# Patient Record
Sex: Female | Born: 1935 | Race: White | Hispanic: No | Smoking: Former smoker
Health system: Southern US, Community
[De-identification: ages and names within clinical notes are randomized; demographics above are authoritative.]

## PROBLEM LIST (undated history)

## (undated) DIAGNOSIS — M1990 Unspecified osteoarthritis, unspecified site: Secondary | ICD-10-CM

## (undated) DIAGNOSIS — K642 Third degree hemorrhoids: Secondary | ICD-10-CM

## (undated) DIAGNOSIS — N3281 Overactive bladder: Secondary | ICD-10-CM

## (undated) DIAGNOSIS — M5030 Other cervical disc degeneration, unspecified cervical region: Secondary | ICD-10-CM

## (undated) DIAGNOSIS — Z862 Personal history of diseases of the blood and blood-forming organs and certain disorders involving the immune mechanism: Secondary | ICD-10-CM

## (undated) DIAGNOSIS — Z973 Presence of spectacles and contact lenses: Secondary | ICD-10-CM

## (undated) DIAGNOSIS — J302 Other seasonal allergic rhinitis: Secondary | ICD-10-CM

## (undated) DIAGNOSIS — K219 Gastro-esophageal reflux disease without esophagitis: Secondary | ICD-10-CM

## (undated) DIAGNOSIS — Z8619 Personal history of other infectious and parasitic diseases: Secondary | ICD-10-CM

## (undated) DIAGNOSIS — R05 Cough: Secondary | ICD-10-CM

## (undated) DIAGNOSIS — F419 Anxiety disorder, unspecified: Secondary | ICD-10-CM

## (undated) DIAGNOSIS — Z8701 Personal history of pneumonia (recurrent): Secondary | ICD-10-CM

## (undated) DIAGNOSIS — N3946 Mixed incontinence: Secondary | ICD-10-CM

## (undated) DIAGNOSIS — R053 Chronic cough: Secondary | ICD-10-CM

## (undated) DIAGNOSIS — J4540 Moderate persistent asthma, uncomplicated: Secondary | ICD-10-CM

## (undated) DIAGNOSIS — Z974 Presence of external hearing-aid: Secondary | ICD-10-CM

## (undated) DIAGNOSIS — H269 Unspecified cataract: Secondary | ICD-10-CM

## (undated) DIAGNOSIS — F32A Depression, unspecified: Secondary | ICD-10-CM

## (undated) DIAGNOSIS — Z87820 Personal history of traumatic brain injury: Secondary | ICD-10-CM

## (undated) DIAGNOSIS — F329 Major depressive disorder, single episode, unspecified: Secondary | ICD-10-CM

## (undated) DIAGNOSIS — J479 Bronchiectasis, uncomplicated: Secondary | ICD-10-CM

## (undated) DIAGNOSIS — M8580 Other specified disorders of bone density and structure, unspecified site: Secondary | ICD-10-CM

## (undated) DIAGNOSIS — K589 Irritable bowel syndrome without diarrhea: Secondary | ICD-10-CM

## (undated) DIAGNOSIS — N281 Cyst of kidney, acquired: Secondary | ICD-10-CM

## (undated) HISTORY — DX: Gastro-esophageal reflux disease without esophagitis: K21.9

## (undated) HISTORY — DX: Other specified disorders of bone density and structure, unspecified site: M85.80

## (undated) HISTORY — PX: BREAST BIOPSY: SHX20

## (undated) HISTORY — DX: Irritable bowel syndrome, unspecified: K58.9

## (undated) HISTORY — DX: Major depressive disorder, single episode, unspecified: F32.9

## (undated) HISTORY — PX: TRANSTHORACIC ECHOCARDIOGRAM: SHX275

## (undated) HISTORY — DX: Depression, unspecified: F32.A

---

## 1990-12-23 HISTORY — PX: TUBAL LIGATION: SHX77

## 1998-08-04 ENCOUNTER — Ambulatory Visit (HOSPITAL_COMMUNITY): Admission: RE | Admit: 1998-08-04 | Discharge: 1998-08-04 | Payer: Self-pay | Admitting: Internal Medicine

## 1999-12-25 ENCOUNTER — Other Ambulatory Visit: Admission: RE | Admit: 1999-12-25 | Discharge: 1999-12-25 | Payer: Self-pay | Admitting: Family Medicine

## 2001-01-22 ENCOUNTER — Ambulatory Visit (HOSPITAL_COMMUNITY): Admission: RE | Admit: 2001-01-22 | Discharge: 2001-01-22 | Payer: Self-pay | Admitting: Gastroenterology

## 2001-01-22 ENCOUNTER — Encounter (INDEPENDENT_AMBULATORY_CARE_PROVIDER_SITE_OTHER): Payer: Self-pay | Admitting: Specialist

## 2001-10-15 ENCOUNTER — Other Ambulatory Visit: Admission: RE | Admit: 2001-10-15 | Discharge: 2001-10-15 | Payer: Self-pay | Admitting: Family Medicine

## 2003-10-11 ENCOUNTER — Encounter: Payer: Self-pay | Admitting: Cardiology

## 2003-10-11 ENCOUNTER — Inpatient Hospital Stay (HOSPITAL_COMMUNITY): Admission: EM | Admit: 2003-10-11 | Discharge: 2003-10-12 | Payer: Self-pay | Admitting: Emergency Medicine

## 2003-10-12 ENCOUNTER — Encounter: Payer: Self-pay | Admitting: Cardiology

## 2003-10-12 HISTORY — PX: CARDIOVASCULAR STRESS TEST: SHX262

## 2004-12-28 ENCOUNTER — Encounter: Admission: RE | Admit: 2004-12-28 | Discharge: 2004-12-28 | Payer: Self-pay | Admitting: Gastroenterology

## 2005-02-19 ENCOUNTER — Ambulatory Visit (HOSPITAL_COMMUNITY): Admission: RE | Admit: 2005-02-19 | Discharge: 2005-02-19 | Payer: Self-pay | Admitting: Gastroenterology

## 2005-02-19 ENCOUNTER — Encounter (INDEPENDENT_AMBULATORY_CARE_PROVIDER_SITE_OTHER): Payer: Self-pay | Admitting: *Deleted

## 2005-04-11 ENCOUNTER — Other Ambulatory Visit: Admission: RE | Admit: 2005-04-11 | Discharge: 2005-04-11 | Payer: Self-pay | Admitting: Family Medicine

## 2005-05-10 ENCOUNTER — Encounter (INDEPENDENT_AMBULATORY_CARE_PROVIDER_SITE_OTHER): Payer: Self-pay | Admitting: Specialist

## 2005-05-10 ENCOUNTER — Ambulatory Visit (HOSPITAL_COMMUNITY): Admission: RE | Admit: 2005-05-10 | Discharge: 2005-05-10 | Payer: Self-pay | Admitting: General Surgery

## 2005-05-10 HISTORY — PX: EVALUATION UNDER ANESTHESIA WITH HEMORRHOIDECTOMY: SHX5624

## 2005-07-29 ENCOUNTER — Encounter: Admission: RE | Admit: 2005-07-29 | Discharge: 2005-07-29 | Payer: Self-pay | Admitting: Gastroenterology

## 2006-02-06 ENCOUNTER — Encounter: Admission: RE | Admit: 2006-02-06 | Discharge: 2006-02-06 | Payer: Self-pay | Admitting: Gastroenterology

## 2006-04-14 ENCOUNTER — Encounter: Admission: RE | Admit: 2006-04-14 | Discharge: 2006-04-14 | Payer: Self-pay | Admitting: Family Medicine

## 2007-05-19 ENCOUNTER — Encounter: Admission: RE | Admit: 2007-05-19 | Discharge: 2007-05-19 | Payer: Self-pay | Admitting: Family Medicine

## 2007-05-25 ENCOUNTER — Encounter: Admission: RE | Admit: 2007-05-25 | Discharge: 2007-05-25 | Payer: Self-pay | Admitting: Family Medicine

## 2007-07-21 ENCOUNTER — Ambulatory Visit: Payer: Self-pay | Admitting: Internal Medicine

## 2007-08-27 ENCOUNTER — Ambulatory Visit: Payer: Self-pay | Admitting: Internal Medicine

## 2007-10-30 ENCOUNTER — Inpatient Hospital Stay (HOSPITAL_COMMUNITY): Admission: EM | Admit: 2007-10-30 | Discharge: 2007-10-31 | Payer: Self-pay | Admitting: Emergency Medicine

## 2007-11-09 ENCOUNTER — Ambulatory Visit: Payer: Self-pay

## 2007-11-27 ENCOUNTER — Ambulatory Visit: Payer: Self-pay | Admitting: Internal Medicine

## 2008-03-09 ENCOUNTER — Other Ambulatory Visit: Admission: RE | Admit: 2008-03-09 | Discharge: 2008-03-09 | Payer: Self-pay | Admitting: Obstetrics & Gynecology

## 2008-04-23 ENCOUNTER — Encounter: Payer: Self-pay | Admitting: Internal Medicine

## 2008-04-23 DIAGNOSIS — D649 Anemia, unspecified: Secondary | ICD-10-CM | POA: Insufficient documentation

## 2008-04-23 DIAGNOSIS — J189 Pneumonia, unspecified organism: Secondary | ICD-10-CM | POA: Insufficient documentation

## 2008-04-23 DIAGNOSIS — I059 Rheumatic mitral valve disease, unspecified: Secondary | ICD-10-CM | POA: Insufficient documentation

## 2008-04-23 DIAGNOSIS — E785 Hyperlipidemia, unspecified: Secondary | ICD-10-CM

## 2008-04-23 DIAGNOSIS — J302 Other seasonal allergic rhinitis: Secondary | ICD-10-CM | POA: Insufficient documentation

## 2008-04-23 DIAGNOSIS — J329 Chronic sinusitis, unspecified: Secondary | ICD-10-CM

## 2008-05-25 ENCOUNTER — Encounter: Admission: RE | Admit: 2008-05-25 | Discharge: 2008-05-25 | Payer: Self-pay | Admitting: Family Medicine

## 2008-11-16 ENCOUNTER — Encounter: Admission: RE | Admit: 2008-11-16 | Discharge: 2008-11-16 | Payer: Self-pay | Admitting: Gastroenterology

## 2009-02-02 ENCOUNTER — Encounter: Admission: RE | Admit: 2009-02-02 | Discharge: 2009-02-02 | Payer: Self-pay | Admitting: Gastroenterology

## 2009-05-16 ENCOUNTER — Encounter: Admission: RE | Admit: 2009-05-16 | Discharge: 2009-07-31 | Payer: Self-pay | Admitting: Obstetrics & Gynecology

## 2009-06-02 ENCOUNTER — Encounter: Admission: RE | Admit: 2009-06-02 | Discharge: 2009-06-02 | Payer: Self-pay | Admitting: Obstetrics & Gynecology

## 2010-02-19 ENCOUNTER — Emergency Department (HOSPITAL_COMMUNITY): Admission: EM | Admit: 2010-02-19 | Discharge: 2010-02-19 | Payer: Self-pay | Admitting: Emergency Medicine

## 2010-09-06 ENCOUNTER — Encounter: Admission: RE | Admit: 2010-09-06 | Discharge: 2010-09-06 | Payer: Self-pay | Admitting: Family Medicine

## 2011-01-13 ENCOUNTER — Encounter: Payer: Self-pay | Admitting: Family Medicine

## 2011-03-13 LAB — BLOOD GAS, VENOUS
Acid-Base Excess: 0.8 mmol/L (ref 0.0–2.0)
Bicarbonate: 22.8 mEq/L (ref 20.0–24.0)
O2 Saturation: 98.1 %
Patient temperature: 98.6
TCO2: 21.1 mmol/L (ref 0–100)
pCO2, Ven: 27.9 mmHg — ABNORMAL LOW (ref 45.0–50.0)
pH, Ven: 7.524 — ABNORMAL HIGH (ref 7.250–7.300)
pO2, Ven: 118 mmHg — ABNORMAL HIGH (ref 30.0–45.0)

## 2011-03-13 LAB — DIFFERENTIAL
Basophils Absolute: 0 K/uL (ref 0.0–0.1)
Basophils Relative: 0 % (ref 0–1)
Eosinophils Absolute: 0 K/uL (ref 0.0–0.7)
Eosinophils Relative: 0 % (ref 0–5)
Lymphocytes Relative: 6 % — ABNORMAL LOW (ref 12–46)
Lymphs Abs: 0.3 K/uL — ABNORMAL LOW (ref 0.7–4.0)
Monocytes Absolute: 0.2 10*3/uL (ref 0.1–1.0)
Monocytes Relative: 4 % (ref 3–12)
Neutro Abs: 4.5 10*3/uL (ref 1.7–7.7)
Neutrophils Relative %: 90 % — ABNORMAL HIGH (ref 43–77)

## 2011-03-13 LAB — BASIC METABOLIC PANEL
BUN: 11 mg/dL (ref 6–23)
CO2: 25 mEq/L (ref 19–32)
Calcium: 8.4 mg/dL (ref 8.4–10.5)
GFR calc Af Amer: >60 mL/min (ref >60)
GFR calc non Af Amer: >60 mL/min (ref >60)
Glucose, Bld: 135 mg/dL — ABNORMAL HIGH (ref 70–99)
Potassium: 3.3 mEq/L — ABNORMAL LOW (ref 3.5–5.1)
Sodium: 137 mEq/L (ref 135–145)

## 2011-03-13 LAB — BASIC METABOLIC PANEL WITH GFR
Chloride: 104 meq/L (ref 96–112)
Creatinine, Ser: 0.87 mg/dL (ref 0.4–1.2)

## 2011-03-13 LAB — CBC
HCT: 32.5 % — ABNORMAL LOW (ref 36.0–46.0)
Hemoglobin: 10.9 g/dL — ABNORMAL LOW (ref 12.0–15.0)
MCHC: 33.6 g/dL (ref 30.0–36.0)
MCV: 91.5 fL (ref 78.0–100.0)
Platelets: 183 10*3/uL (ref 150–400)
RBC: 3.55 MIL/uL — ABNORMAL LOW (ref 3.87–5.11)
RDW: 15.1 % (ref 11.5–15.5)
WBC: 5 K/uL (ref 4.0–10.5)

## 2011-03-18 ENCOUNTER — Other Ambulatory Visit (HOSPITAL_COMMUNITY): Payer: Self-pay | Admitting: Gastroenterology

## 2011-03-18 DIAGNOSIS — R197 Diarrhea, unspecified: Secondary | ICD-10-CM

## 2011-04-05 ENCOUNTER — Ambulatory Visit (HOSPITAL_COMMUNITY)
Admission: RE | Admit: 2011-04-05 | Discharge: 2011-04-05 | Disposition: A | Discharge status: Home / Self Care | Payer: Medicare Other | Source: Ambulatory Visit | Attending: Gastroenterology | Admitting: Gastroenterology

## 2011-04-05 ENCOUNTER — Encounter (HOSPITAL_COMMUNITY): Payer: Self-pay

## 2011-04-05 DIAGNOSIS — R197 Diarrhea, unspecified: Secondary | ICD-10-CM | POA: Insufficient documentation

## 2011-04-05 DIAGNOSIS — R109 Unspecified abdominal pain: Secondary | ICD-10-CM | POA: Insufficient documentation

## 2011-04-05 MED ORDER — SINCALIDE 5 MCG IJ SOLR: 0.0200 ug/kg | Freq: Once | INTRAMUSCULAR | Status: DC

## 2011-04-05 MED ORDER — TECHNETIUM TC 99M MEBROFENIN IV KIT
5.5000 | PACK | Freq: Once | INTRAVENOUS | Status: AC | PRN
  Administered 2011-04-05: 5.5 via INTRAVENOUS

## 2011-04-15 ENCOUNTER — Other Ambulatory Visit: Payer: Self-pay | Admitting: Gastroenterology

## 2011-04-15 DIAGNOSIS — R110 Nausea: Secondary | ICD-10-CM

## 2011-04-16 ENCOUNTER — Inpatient Hospital Stay (HOSPITAL_COMMUNITY)
Admission: EM | Admit: 2011-04-16 | Discharge: 2011-04-28 | DRG: 371 | Disposition: A | Discharge status: Home Health | Payer: Medicare Other | Attending: Internal Medicine | Admitting: Internal Medicine

## 2011-04-16 DIAGNOSIS — E8809 Other disorders of plasma-protein metabolism, not elsewhere classified: Secondary | ICD-10-CM | POA: Diagnosis present

## 2011-04-16 DIAGNOSIS — R0609 Other forms of dyspnea: Secondary | ICD-10-CM | POA: Diagnosis present

## 2011-04-16 DIAGNOSIS — F329 Major depressive disorder, single episode, unspecified: Secondary | ICD-10-CM | POA: Diagnosis present

## 2011-04-16 DIAGNOSIS — R0989 Other specified symptoms and signs involving the circulatory and respiratory systems: Secondary | ICD-10-CM | POA: Diagnosis present

## 2011-04-16 DIAGNOSIS — R630 Anorexia: Secondary | ICD-10-CM | POA: Diagnosis present

## 2011-04-16 DIAGNOSIS — T450X5A Adverse effect of antiallergic and antiemetic drugs, initial encounter: Secondary | ICD-10-CM | POA: Diagnosis present

## 2011-04-16 DIAGNOSIS — L251 Unspecified contact dermatitis due to drugs in contact with skin: Secondary | ICD-10-CM | POA: Diagnosis present

## 2011-04-16 DIAGNOSIS — R1310 Dysphagia, unspecified: Secondary | ICD-10-CM | POA: Diagnosis present

## 2011-04-16 DIAGNOSIS — Z22322 Carrier or suspected carrier of Methicillin resistant Staphylococcus aureus: Secondary | ICD-10-CM

## 2011-04-16 DIAGNOSIS — Z880 Allergy status to penicillin: Secondary | ICD-10-CM

## 2011-04-16 DIAGNOSIS — A0472 Enterocolitis due to Clostridium difficile, not specified as recurrent: Principal | ICD-10-CM | POA: Diagnosis present

## 2011-04-16 DIAGNOSIS — F05 Delirium due to known physiological condition: Secondary | ICD-10-CM | POA: Diagnosis not present

## 2011-04-16 DIAGNOSIS — K219 Gastro-esophageal reflux disease without esophagitis: Secondary | ICD-10-CM | POA: Diagnosis present

## 2011-04-16 DIAGNOSIS — Z87891 Personal history of nicotine dependence: Secondary | ICD-10-CM

## 2011-04-16 DIAGNOSIS — D649 Anemia, unspecified: Secondary | ICD-10-CM | POA: Diagnosis not present

## 2011-04-16 DIAGNOSIS — Z9109 Other allergy status, other than to drugs and biological substances: Secondary | ICD-10-CM

## 2011-04-16 DIAGNOSIS — J690 Pneumonitis due to inhalation of food and vomit: Secondary | ICD-10-CM | POA: Diagnosis not present

## 2011-04-16 DIAGNOSIS — J449 Chronic obstructive pulmonary disease, unspecified: Secondary | ICD-10-CM | POA: Diagnosis present

## 2011-04-16 DIAGNOSIS — R030 Elevated blood-pressure reading, without diagnosis of hypertension: Secondary | ICD-10-CM | POA: Diagnosis not present

## 2011-04-16 DIAGNOSIS — Z79899 Other long term (current) drug therapy: Secondary | ICD-10-CM

## 2011-04-16 DIAGNOSIS — F3289 Other specified depressive episodes: Secondary | ICD-10-CM | POA: Diagnosis present

## 2011-04-16 DIAGNOSIS — E871 Hypo-osmolality and hyponatremia: Secondary | ICD-10-CM | POA: Diagnosis present

## 2011-04-16 DIAGNOSIS — R7301 Impaired fasting glucose: Secondary | ICD-10-CM | POA: Diagnosis not present

## 2011-04-16 DIAGNOSIS — J4489 Other specified chronic obstructive pulmonary disease: Secondary | ICD-10-CM | POA: Diagnosis present

## 2011-04-16 DIAGNOSIS — D259 Leiomyoma of uterus, unspecified: Secondary | ICD-10-CM | POA: Diagnosis present

## 2011-04-16 LAB — COMPREHENSIVE METABOLIC PANEL
ALT: 14 U/L (ref 0–35)
AST: 19 U/L (ref 0–37)
Albumin: 2.7 g/dL — ABNORMAL LOW (ref 3.5–5.2)
Alkaline Phosphatase: 52 U/L (ref 39–117)
BUN: 8 mg/dL (ref 6–23)
Chloride: 104 mEq/L (ref 96–112)
GFR calc Af Amer: >60 mL/min (ref >60)
Potassium: 3.7 mEq/L (ref 3.5–5.1)
Sodium: 134 mEq/L — ABNORMAL LOW (ref 135–145)
Total Bilirubin: 0.8 mg/dL (ref 0.3–1.2)

## 2011-04-17 ENCOUNTER — Other Ambulatory Visit: Payer: Medicare Other

## 2011-04-17 ENCOUNTER — Encounter (HOSPITAL_COMMUNITY): Payer: Self-pay

## 2011-04-17 ENCOUNTER — Inpatient Hospital Stay (HOSPITAL_COMMUNITY): Payer: Medicare Other

## 2011-04-17 LAB — CBC
MCV: 87.4 fL (ref 78.0–100.0)
Platelets: 257 10*3/uL (ref 150–400)
RDW: 13.6 % (ref 11.5–15.5)
WBC: 15.8 10*3/uL — ABNORMAL HIGH (ref 4.0–10.5)

## 2011-04-17 LAB — URINALYSIS, ROUTINE W REFLEX MICROSCOPIC
Bilirubin Urine: NEGATIVE
Glucose, UA: NEGATIVE mg/dL
Hgb urine dipstick: NEGATIVE
Ketones, ur: NEGATIVE mg/dL
Nitrite: NEGATIVE
Protein, ur: NEGATIVE mg/dL
Specific Gravity, Urine: 1.01 (ref 1.005–1.030)
Urobilinogen, UA: 0.2 mg/dL (ref 0.0–1.0)
pH: 6 (ref 5.0–8.0)

## 2011-04-17 LAB — BASIC METABOLIC PANEL
BUN: 6 mg/dL (ref 6–23)
Creatinine, Ser: 0.69 mg/dL (ref 0.4–1.2)
GFR calc non Af Amer: >60 mL/min (ref >60)
Potassium: 3.8 mEq/L (ref 3.5–5.1)

## 2011-04-17 LAB — MAGNESIUM: Magnesium: 2.2 mg/dL (ref 1.5–2.5)

## 2011-04-17 MED ORDER — IOHEXOL 300 MG/ML  SOLN
100.0000 mL | Freq: Once | INTRAMUSCULAR | Status: AC | PRN
  Administered 2011-04-17: 100 mL via INTRAVENOUS

## 2011-04-18 LAB — URINE CULTURE

## 2011-04-19 LAB — CBC
Hemoglobin: 11.1 g/dL — ABNORMAL LOW (ref 12.0–15.0)
RBC: 3.95 MIL/uL (ref 3.87–5.11)

## 2011-04-19 LAB — BASIC METABOLIC PANEL
CO2: 19 mEq/L (ref 19–32)
Chloride: 110 mEq/L (ref 96–112)
GFR calc Af Amer: >60 mL/min (ref >60)
Potassium: 3.9 mEq/L (ref 3.5–5.1)
Sodium: 134 mEq/L — ABNORMAL LOW (ref 135–145)

## 2011-04-19 LAB — HEMOGLOBIN A1C: Mean Plasma Glucose: 126 mg/dL — ABNORMAL HIGH (ref <117)

## 2011-04-20 LAB — CBC
HCT: 32.2 % — ABNORMAL LOW (ref 36.0–46.0)
Hemoglobin: 10.7 g/dL — ABNORMAL LOW (ref 12.0–15.0)
RDW: 14.2 % (ref 11.5–15.5)
WBC: 22.4 10*3/uL — ABNORMAL HIGH (ref 4.0–10.5)

## 2011-04-20 LAB — BASIC METABOLIC PANEL
CO2: 24 mEq/L (ref 19–32)
GFR calc non Af Amer: >60 mL/min (ref >60)
Glucose, Bld: 101 mg/dL — ABNORMAL HIGH (ref 70–99)
Potassium: 4.2 mEq/L (ref 3.5–5.1)
Sodium: 134 mEq/L — ABNORMAL LOW (ref 135–145)

## 2011-04-20 NOTE — H&P (Signed)
[name], [name]              ACCOUNT NO.:  [id number]  MEDICAL RECORD NO.:  [id number]           PATIENT TYPE:  E  LOCATION:  WLED                         FACILITY:  [facility]  PHYSICIAN:  Vania Rea, M.D. DATE OF BIRTH:  03/12/1935  DATE OF ADMISSION:  04/16/2011 DATE OF DISCHARGE:                             HISTORY & PHYSICAL   PRIMARY CARE PHYSICIAN:  Dr. Ace Gins at [facility].  CHIEF COMPLAINT:  Persistent nausea, retching, and diarrhea.  HISTORY OF PRESENT ILLNESS:  This is a 76 year old Caucasian lady with a history of GERD with chronic cough, multiple allergies and a mild COPD, who suffers with chronic gastrointestinal disorder.  She has severe GERD for which she takes Nexium.  It apparently causes a chronic cough, aggravated by her mild COPD and she has episodic diarrhea.  The patient has however been pretty close to baseline until about 10 days ago when she had a HIDA scan to evaluate her persistent undiagnosed gastrointestinal systems.  The patient reports that since having a HIDA scan she has been having worsening diarrhea, nausea, retching and worsening cough.  She was seen at her doctor's office 3 days after the HIDA scan and prescribed Phenergan tablets as well as being given intravenous hydration and intramuscular Phenergan while in the office; however, the patient has failed to improve from then until now.  She apparently saw Dr. Ewing Schlein recently who started her on Bentyl and after taking the Bentyl for less than 24 hours, the patient revisited her primary care physician today with pruritic rash, which seemed to be like urticaria.  She received intravenous Solu-Medrol and Benadryl and was sent to the emergency room for further evaluation.  The patient had a CBC drawn in her doctor's office and she had a complete metabolic panel done in the emergency room.  No laboratory abnormalities were found but because the patient was so  ill-looking and because her symptoms have been persisting despite reasonable treatment, hospitalist service has been called to assist with management.  The patient denies any fever.  She is in a heated discussion with her husband whether she is actually more retching or coughing and her diarrhea seems to be mostly yellow mucus and no blood, but has caused her mouth to be persistently dry over the past many days.  PAST MEDICAL HISTORY: 1. GERD. 2. COPD/asthma. 3. Nasal allergies. 4. Depression. 5. Chronic GI upset.  MEDICATIONS:  #  Simethicone 80 mg four times daily as needed. 1. Recently started topical Biotene for dry mouth. 2. Recently started Phenergan 25 mg every 6 hours as needed. 3. Recently started Bentyl 20 mg four times daily. 4. Flonase nasal spray daily to both nostrils. 5. Zantac 150 mg daily as needed. 6. Singulair 10 mg daily. 7. Spiriva 18 mcg daily. 8. Advair Diskus 500/50 one puff twice daily. 9. Evista 60 mg daily. 10.Vitamin D 1000 units twice daily. 11.Nexium 40 mg daily. 12.Prozac 40 mg daily. 13.Calcium with zinc 1 tablet twice daily.  ALLERGIES: 1. PENICILLIN. 2. PROPOXYPHENE.  SOCIAL HISTORY:  She discontinued tobacco use in 1990 after having smoked since a teenager.  Denies  alcohol or illicit drug use.  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  FAMILY HISTORY:  Significant for a mother who was very long-lived and was pretty healthy up until she died at age 28 and a father who died in accidental death, did not live very long.  Brothers and sisters with coronary artery disease and GERD.  She also had a sister with severe gastrointestinal problems which was never relieved until she had her gallbladder removed which has led to the family wondering very seriously if [name] also has a gallbladder problem.  REVIEW OF SYSTEMS:  Other than noted above, complete review of systems are unremarkable.  She is somewhat  hard of hearing, but is able to give history without her hearing aid.  PHYSICAL EXAMINATION:  GENERAL:  A very pleasant elderly Caucasian lady reclining in the stretcher, moderately ill-looking. VITAL SIGNS:  Her temperature is 99.7, pulse 67, respirations 16, blood pressure 137/44.  She is saturating at 99% on room air.  Her weight in her doctor's office today was 125 pounds. HEENT:  Her pupils are round and equal.  Mucous membranes are pink, dry, anicteric.  No cervical lymphadenopathy.  No thyromegaly.  No carotid bruit.  Her mouth shows no sign of Candida or any other rash.  Throat is not hyperemic. CHEST:  Clear to auscultation bilaterally. CARDIOVASCULAR SYSTEM:  Regular rhythm.  No murmur heard. ABDOMEN:  Mildly distended and she reports diffuse tenderness.  Bowel sounds are normal.  No masses are felt. EXTREMITIES:  Without edema.  She has no arthritic deformities of knees or ankles. SKIN:  She has rose-colored easily blanching macules on her abdomen suggestive of acute allergy. CENTRAL NERVOUS SYSTEM:  Cranial nerves II-XII are grossly intact.  She has no focal lateralizing signs.  LABORATORY DATA:  CBC done at her primary physician's office shows a white count of 9.3, hemoglobin 11.5, platelets 266.  Differential is unremarkable.  There is no comment on eosinophils.  Her complete metabolic panel shows a sodium slightly low at 134, potassium 3.7, chloride of 104, CO2 of 21, glucose 99, BUN 8, creatinine 0.7, calcium is 7.9.  Her total protein is 5.4, albumin 2.7.  Her liver functions are otherwise unremarkable.  HIDA scan was reviewed from April 05, 2011, and that showed normal function with a gallbladder ejection fraction of 61%, normally 30%.  ASSESSMENT: 1. Gastroenteritis of unclear etiology. 2. Given her antibiotic exposure, need to rule out Clostridium     difficile. 3. Possible irritable bowel syndrome. 4. Possible allergic reaction given the description of a  pure mucousy     diarrhea. 5. Acute on chronic gastroesophageal reflux disease. 6. Chronic obstructive pulmonary disease. 7. Acute allergy possibly to BENTYL. 8. Dehydration as evidenced by the hyponatremia. 9. Chronic undernutrition as evidenced by hypoproteinemia and     hypoalbuminemia.  PLAN: 1. We will admit this lady for hydration.  We will keep her on clear     liquid diet and we will consult her gastroenterologist Dr. Ewing Schlein     for assistance in the morning.  She was scheduled for a CT scan of     the abdomen tomorrow and we will go ahead and get that tonight.  We     will check stool for C. diff PCR and we will start her empirically     on vancomycin and discontinue this if the PCR is negative.  We will     avoid Flagyl because of her extreme  nausea. 2. We will continue oral Benadryl and Pepcid for acute allergies. 3. We will double her dose of Nexium.  We will continue conservative     management of her COPD. 4. Other plans as per orders.     Vania Rea, M.D.     LC/MEDQ  D:  04/16/2011  T:  04/17/2011  Job:  [id number]  cc:   Petra Kuba, M.D. Fax: [phone]  Ace Gins, MD  Dr. W[facility]  Electronically Signed by Vania Rea M.D. on 04/20/2011 02:51:09 AM

## 2011-04-21 ENCOUNTER — Inpatient Hospital Stay (HOSPITAL_COMMUNITY): Payer: Medicare Other

## 2011-04-21 LAB — BASIC METABOLIC PANEL
CO2: 23 mEq/L (ref 19–32)
Calcium: 7.3 mg/dL — ABNORMAL LOW (ref 8.4–10.5)
Chloride: 106 mEq/L (ref 96–112)
Glucose, Bld: 105 mg/dL — ABNORMAL HIGH (ref 70–99)
Sodium: 134 mEq/L — ABNORMAL LOW (ref 135–145)

## 2011-04-21 LAB — CBC
HCT: 32.4 % — ABNORMAL LOW (ref 36.0–46.0)
Hemoglobin: 10.8 g/dL — ABNORMAL LOW (ref 12.0–15.0)
MCHC: 33.3 g/dL (ref 30.0–36.0)
RBC: 3.69 MIL/uL — ABNORMAL LOW (ref 3.87–5.11)

## 2011-04-21 LAB — MRSA PCR SCREENING: MRSA by PCR: POSITIVE — AB

## 2011-04-21 LAB — AMMONIA: Ammonia: 35 umol/L (ref 11–35)

## 2011-04-21 LAB — BLOOD GAS, ARTERIAL
Bicarbonate: 22.6 mEq/L (ref 20.0–24.0)
Patient temperature: 98.6
TCO2: 21 mmol/L (ref 0–100)
pCO2 arterial: 37.8 mmHg (ref 35.0–45.0)
pH, Arterial: 7.395 (ref 7.350–7.400)
pO2, Arterial: 85.2 mmHg (ref 80.0–100.0)

## 2011-04-22 LAB — BRAIN NATRIURETIC PEPTIDE: Pro B Natriuretic peptide (BNP): <30 pg/mL (ref 0.0–100.0)

## 2011-04-22 LAB — BASIC METABOLIC PANEL
Chloride: 104 mEq/L (ref 96–112)
GFR calc Af Amer: >60 mL/min (ref >60)
GFR calc non Af Amer: >60 mL/min (ref >60)
Potassium: 4 mEq/L (ref 3.5–5.1)
Sodium: 136 mEq/L (ref 135–145)

## 2011-04-22 LAB — CBC
Platelets: 359 10*3/uL (ref 150–400)
RBC: 3.42 MIL/uL — ABNORMAL LOW (ref 3.87–5.11)
RDW: 14.6 % (ref 11.5–15.5)
WBC: 12.1 10*3/uL — ABNORMAL HIGH (ref 4.0–10.5)

## 2011-04-23 ENCOUNTER — Inpatient Hospital Stay (HOSPITAL_COMMUNITY): Payer: Medicare Other

## 2011-04-23 LAB — CBC
HCT: 31.2 % — ABNORMAL LOW (ref 36.0–46.0)
Hemoglobin: 10.2 g/dL — ABNORMAL LOW (ref 12.0–15.0)
MCHC: 32.7 g/dL (ref 30.0–36.0)
RDW: 14.5 % (ref 11.5–15.5)
WBC: 12.3 10*3/uL — ABNORMAL HIGH (ref 4.0–10.5)

## 2011-04-23 LAB — BASIC METABOLIC PANEL
CO2: 25 mEq/L (ref 19–32)
Calcium: 7.3 mg/dL — ABNORMAL LOW (ref 8.4–10.5)
GFR calc Af Amer: >60 mL/min (ref >60)
GFR calc non Af Amer: >60 mL/min (ref >60)
Glucose, Bld: 98 mg/dL (ref 70–99)
Potassium: 3.8 mEq/L (ref 3.5–5.1)
Sodium: 133 mEq/L — ABNORMAL LOW (ref 135–145)

## 2011-04-23 NOTE — Group Therapy Note (Signed)
[name], [name]              ACCOUNT NO.:  [id number]  MEDICAL RECORD NO.:  [id number]           PATIENT TYPE:  I  LOCATION:  1238                         FACILITY:  [facility]  PHYSICIAN:  Hillery Aldo, M.D.   DATE OF BIRTH:  03-04-1935                                PROGRESS NOTE   DATE OF DISCHARGE: Pending.  PRIMARY CARE PHYSICIAN: Ace Gins, MD, at [facility] practice.  CURRENT DIAGNOSES: 1. Severe Clostridium difficile colitis. 2. Persistent nausea and vomiting. 3. Hospital-acquired pneumonia. 4. Delirium. 5. Impaired fasting glucose/hyperglycemia. 6. Anorexia. 7. Transient hypertension. 8. Rash secondary to adverse drug reaction 9. Gastroesophageal reflux disease. 10.History of chronic obstructive pulmonary disease/asthma. 11.Depression. 12.Normocytic anemia. 13.Uterine fibroids.  DISCHARGE MEDICATIONS: Will be dictated at time of actual discharge.  CONSULTATIONS: None.  BRIEF ADMISSION HISTORY OF PRESENT ILLNESS: The patient is a very pleasant 76 year old female who presented to the hospital with a chief complaint of diarrhea, nausea, vomiting, and worsening cough since having undergone a HIDA scan to evaluate her persistent abdominal symptoms.  The patient was given Phenergan by her primary care physician and ultimately presented to the hospital with ongoing symptoms.  For the full details, please see the dictated report done by Dr. Orvan Falconer.  PROCEDURES AND DIAGNOSTIC STUDIES.: 1. CT scan of the abdomen and pelvis on April 17, 2011, showed diffuse     colitis involving the entirety of the colon with colonic wall     thickening, extensive soft tissue stranding.  Most prominence along     the sigmoid colon and rectum may reflect an infectious or     inflammatory process.  No definite evidence for ischemic colitis.     Mild partial consolidation of the right lower lobe with trace right     pleural effusion.  Multiple hepatic  hemangiomas.  Scattered small     bilateral renal cysts.  Multiple calcified fibroids within the     uterus.  Prominent periuterine vasculature. 2. Chest x-ray on April 21, 2011, showed poor inspiration with basilar     atelectasis and possible pneumonia at the left lung base.     Questionable mild pulmonary vascular congestion. 3. Chest x-ray on Apr 23, 2011, showed continued low lung volumes with     confluent bibasilar opacity, favor representing a combination of     atelectasis and infection.  No interval change.  DISCHARGE LABORATORY VALUES: Will be dictated at time of actual discharge.  HOSPITAL COURSE BY PROBLEM: 1. Severe Clostridium difficile colitis:  The patient was admitted and     Clostridium difficile PCR studies were sent and turned positive     within 24 hours.  She was started on oral vancomycin, IV Flagyl as     well as Florastor but despite aggressive therapy, has continued to     have large numbers of diarrhea stools.  Cholestyramine was added on     April 22, 2011.  At this point, the patient is reporting some mild     improvement.  She will need a prolonged course of therapy given     that we have had to start her  on broad-spectrum antibiotics to     treat a hospital-acquired pneumonia. 2. Nausea and vomiting:  The patient has been provided with     antiemetics p.r.n..  Vomiting has subsided but she continues to     complain of nausea and diminished appetite. 3. Hospital-acquired pneumonia:  The patient did have infiltrates     noted on her original CT scanning and it was hoped that we could     avoid putting her on antibiotics given her severe Clostridium     difficile.  The patient continued to have respiratory problems with     increasing cough and ultimately was felt to have a hospital-     acquired pneumonia.  She was put on a combination of Avelox and     vancomycin due to her nasal swab being positive for MRSA.  It was     felt that the Flagyl would cover  anaerobes.  At this point, the     patient is on day #3 of therapy and has clinically improved.  She     was moved to the step-down unit for closer monitoring and it is     anticipated that she will stabilize enough in the next 24 hours to     be transferred back to the floor. 4. Delirium:  The patient did develop hallucinations and restlessness     consistent with acute delirium just prior to the initiation of     antibiotics for hospital-acquired pneumonia.  At this point, the     patient's delirium is slowly improving.  She did require p.r.n.     Haldol and sedating medications have been withdrawn. 5. Impaired fasting glucose/hyperglycemia:  The patient was noted to     have elevated blood glucoses on fasting samples.  A hemoglobin A1c     was checked and found to be 6% which is slightly above the normal     range.  She likely does have some underlying impaired fasting     glucose but at this point is not consuming enough calories to put     her on a diet.  When she is well, would consider diet counseling. 6. Anorexia:  Secondary to #1 and #2.  Should improve as her overall     clinical condition improves. 7. Hypertension:  The patient did develop transient hypertension.  It     was felt to be due to IV fluid administration.  Her IV fluids have     been cut back and her blood pressure has come down to normal.  The patient's other chronic medical problems have remained stable while in the hospital.     Hillery Aldo, M.D.     CR/MEDQ  D:  04/23/2011  T:  04/23/2011  Job:  [id number]  cc:   Ace Gins, MD  Electronically Signed by Hillery Aldo M.D. on 04/23/2011 06:30:12 PM

## 2011-04-24 LAB — BASIC METABOLIC PANEL
CO2: 26 mEq/L (ref 19–32)
Chloride: 96 mEq/L (ref 96–112)
Glucose, Bld: 95 mg/dL (ref 70–99)
Potassium: 3.9 mEq/L (ref 3.5–5.1)
Sodium: 133 mEq/L — ABNORMAL LOW (ref 135–145)

## 2011-04-24 LAB — CBC
HCT: 33.3 % — ABNORMAL LOW (ref 36.0–46.0)
Hemoglobin: 10.8 g/dL — ABNORMAL LOW (ref 12.0–15.0)
MCHC: 32.4 g/dL (ref 30.0–36.0)
RBC: 3.8 MIL/uL — ABNORMAL LOW (ref 3.87–5.11)

## 2011-04-25 LAB — CBC
MCH: 28.3 pg (ref 26.0–34.0)
Platelets: 458 10*3/uL — ABNORMAL HIGH (ref 150–400)
RBC: 3.64 MIL/uL — ABNORMAL LOW (ref 3.87–5.11)
RDW: 14.4 % (ref 11.5–15.5)
WBC: 11.5 10*3/uL — ABNORMAL HIGH (ref 4.0–10.5)

## 2011-04-25 LAB — GLUCOSE, CAPILLARY: Glucose-Capillary: 105 mg/dL — ABNORMAL HIGH (ref 70–99)

## 2011-04-25 LAB — EXPECTORATED SPUTUM ASSESSMENT W GRAM STAIN, RFLX TO RESP C

## 2011-04-27 LAB — BASIC METABOLIC PANEL WITH GFR
BUN: <3 mg/dL — ABNORMAL LOW (ref 6–23)
CO2: 32 meq/L (ref 19–32)
Calcium: 8.1 mg/dL — ABNORMAL LOW (ref 8.4–10.5)
Chloride: 100 meq/L (ref 96–112)
Creatinine, Ser: <0.47 mg/dL (ref 0.4–1.2)
Glucose, Bld: 104 mg/dL — ABNORMAL HIGH (ref 70–99)
Potassium: 3.5 meq/L (ref 3.5–5.1)
Sodium: 138 meq/L (ref 135–145)

## 2011-04-29 NOTE — Discharge Summary (Signed)
  [name], [name]              ACCOUNT NO.:  [id number]  MEDICAL RECORD NO.:  [id number]           PATIENT TYPE:  I  LOCATION:  1409                         FACILITY:  [facility]  PHYSICIAN:  Brendia Sacks, MD    DATE OF BIRTH:  May 23, 1935  DATE OF ADMISSION:  04/16/2011 DATE OF DISCHARGE:  04/28/2011                              DISCHARGE SUMMARY   ADDENDUM:  This is an addendum discharge summary to the one dictated by Algis Downs on Apr 26, 2011.  PRIMARY CARE PHYSICIAN:  Ernestina Penna, MD  PRIMARY GASTROENTEROLOGIST:  Anselmo Rod, MD, [facility]  The patient's hospitalization course has remained unchanged.  Discharge diagnoses and her summary remain unchanged.  This is an addendum discharge summary to clarify discharge medications.  This list of discharge medications supercedes the one in the previous discharge summary. 1. Bedside commode for generalized weakness, pneumonia, and     Clostridium difficile colitis. 2. Ensure p.o. t.i.d. 3. Nystatin 5 mL oral suspension q.i.d. 4. Oxygen at 2 L per minute nasal cannula for pneumonia and hypoxia. 5. Home Health physical therapy 6. Vancomycin 125 mg p.o. q.i.d. for 10 days, last dose May 08, 2011. 7. Advair Diskus 500/50 one puff b.i.d. 8. Biotin one application daily as needed for dry mouth. 9. Calcium with zinc p.o. b.i.d. 10.Evista 60 mg p.o. daily. 11.Fenofibrate 160 mg p.o. daily. 12.Flexeril 10 mg p.o. q.h.s. as needed at bedtime. 13.Flonase 1 spray in both nostrils daily as needed. 14.HyoMax 1/2 to 1 tablet  p.o. b.i.d. 15.Nexium 40 mg p.o. daily. 16.Promethazine 25 mg p.o. every 6 hours as needed for nausea. 17.Prozac 20 mg p.o. 2 tablets daily. 18.Simethicone 80 mg p.o. daily as needed for gas. 19.Singular 10 mg p.o. daily. 20.Spiriva 1 inhalation daily. 21.Tramadol 50 mg every 6 hours as needed for pain. 22.Ventolin 1 to 2 puffs every 4 hours as needed for shortness of     breath. 23.Vitamin D 1000 units  p.o. b.i.d. 24.Zantac 150 mg at bedtime if Nexium not helping  Discontinue the following medications: 1. Azithromycin. 2. Bentyl.  Time coordinating discharge is 35 minutes.     Brendia Sacks, MD     DG/MEDQ  D:  04/28/2011  T:  04/28/2011  Job:  [id number]  cc:   Ernestina Penna, M.D. Fax: [phone]  WGNFAO ZHY QMVH, MD, [facility] Fax: [phone]  Electronically Signed by Brendia Sacks  on 04/29/2011 06:04:43 PM

## 2011-04-30 LAB — VIRUS CULTURE

## 2011-05-07 NOTE — Discharge Summary (Signed)
[name], [name]              ACCOUNT NO.:  [id number]   MEDICAL RECORD NO.:  [id number]          PATIENT TYPE:  INP   LOCATION:  4735                         FACILITY:  [facility]   PHYSICIAN:  Lucita Ferrara, MD         DATE OF BIRTH:  [birth year]   DATE OF ADMISSION:  10/30/2007  DATE OF DISCHARGE:  10/31/2007                               DISCHARGE SUMMARY   ADMISSION DIAGNOSIS:  1. Chest pain, rule out myocardial ischemia.   OTHER DIAGNOSES UPON ADMISSION:  Are as follows:  1. Osteoporosis.  2. Hypomagnesemia.  3. Gastroesophageal reflux disease.  4. Asthmatic bronchitis.  5. Irritable bowel syndrome.  6. Mitral valve prolapse.   DISCHARGE DIAGNOSES:  1. Chest pain, resolved.  2. Osteoporosis.  3. Hypomagnesemia.  4. Gastroesophageal reflux disease.  5. Asthmatic bronchitis.  6. Irritable bowel syndrome.  7. Mitral valve prolapse.   DISCHARGE CONDITION:  Patient is stable, no longer has chest pain,  hemodynamically stable.   CONSULTATIONS:  None.   PROCEDURES:  1. Patient had a chest x-ray which was negative.  2. Patient had serial cardiac enzymes which were also negative x3.  3. Fasting lipid panel was also done which showed total cholesterol of      185, triglycerides 41, cholesterol HDL 66, LDL 111.  4. A complete metabolic panel which was within normal limits.   OTHER PROCEDURES WHICH WERE DONE:  EKG which showed some EKG changes in  the anterior leads, flipped T waves.  This was compared to the prior EKG  which was back in September of 2008.   BRIEF HISTORY:  Patient is a 76 year old who presented to [facility] with chest pain that started in the a.m. on October 30, 2007, radiating to shoulder and back, worse with coughing.  Patient had  coughing exacerbation secondary to asthmatic bronchitis.  She was also  diaphoretic at that time.  She presented to primary care's office, saw a  physician assistant and had the EKG done and was sent here to  the  emergency room.  Patient was given en route four aspirin 81 mg and two  nitros and pain then resolved.  Patient presented here.  Pain was  nonreproducible and no changes in position, no gastroesophageal reflux  disease.   HOSPITAL COURSE:  Patient stayed overnight.  Pain completely resolved.  The coughing also resolved.  Patient was empirically started on  Zithromax 500 mg and patient was given Lovenox 40 mg subcutaneously  q.day for DVT prophylaxis, oxygen, nitro p.r.n., and patient was also  given a small dose of metoprolol at 12.5 mg p.o. b.i.d.  Routine chest  pain protocol.  Patient's, again, pain completely resolved.  Her  diaphoresis and cough also resolved.   DISCHARGE PHYSICAL EXAMINATION:  GENERAL:  Generally speaking, patient  is currently in no acute distress.  HEENT:  Normocephalic, atraumatic.  Sclerae anicteric.  NECK:  Supple.  No JVD.  No carotid bruits.  CARDIOVASCULAR:  S1, S2.  Regular rate and rhythm.  No rubs or clicks.  There is a 2/6 diastolic murmur noted.  VITAL SIGNS:  Temperature 98.2, pulse 80, blood pressure 120/82,  respirations 18, pulse ox 99% on room air.  ABDOMEN:  Soft, nontender, nondistended.  Positive bowel sounds.  EXTREMITIES:  No clubbing, cyanosis or edema.   ACTIVITY:  Patient is able to walk, ambulate without chest pain or  shortness of breath.   DIET:  Normal.   MEDICATIONS:  Patient is going to be discharged on the following  medications:  1. Evista; she is unsure of the dosage and she takes it daily and will      continue that.  2. Gaviscon ES as needed.  3. Magnesium 250 mg; magnesium oxide, I believe; she is unsure of that      dose, but I would continue that.  4. Nexium 40 mg p.o. daily.  5. Prozac 20 mg p.o. daily.  6. Singulair, unsure of the dose.  7. Vitamin C chewable daily.  8. Zocor.  9. Zyrtec 10 mg p.o. daily.  10.Patient is to continue Pulmicort, Mucinex and acidophilus, and      albuterol and Atrovent.   11.Patient also is to continue Zithromax.   FOLLOWUP INSTRUCTIONS:  1. Patient is going to be scheduled for outpatient stress test with      [facility].  I called the PA, Ward Givens, who is going to      schedule this for her and I have already faxed the information to      the office.  2. Patient to resume all normal activities; call us if she has any      chest pain, shortness of breath, to present back to the emergency      room via EMS or acutely.      Lucita Ferrara, MD  Electronically Signed     RR/MEDQ  D:  10/31/2007  T:  10/31/2007  Job:  [id number]

## 2011-05-07 NOTE — Assessment & Plan Note (Signed)
[facility]                             PULMONARY OFFICE NOTE   NAME:[name], [name]                     MRN:          [id number]  DATE:07/21/2007                            DOB:          [birth year]    PROBLEM:  A 76 year old woman referred through the courtesy of Dr. Christell Constant  in pulmonary consultation concerned about asthma with coughing.   HISTORY:  She has had a long-standing history of a problem coughing  going back into the 1990s, when she was seeing Dr. Marga Melnick as  her primary physician.  He diagnosed reflux and treatment for reflux did  seem to help the cough some.  She has been somewhat worse in the last 4  years.  Singulair was added and that seemed to help but metered inhalers  and Spiriva have not helped.  There used to be a seasonal pattern but it  has become pretty constant year round now with irritant triggers  recognized including dust, mold, and perfumes.  She says peppermint  makes it better.  And, she describes daily cough producing scant clear  sputum.  Sometimes there is some wheeze.  She has become a little more  easily short of breath in recent years with exertion.  Chest x-ray  report from [facility] done April 08, 2006, described no acute  abnormality, probable COPD with hyperinflation but clear lungs.  Spirometry at that date was normal.   MEDICATIONS:  Singulair, Prozac, Nexium, Zantac, Evista, vitamins.   DRUG INTOLERANT:  1. PENICILLIN.  2. LATEX.  No problem with contrast dye or aspirin.  No problem with specific foods  or unusual reactions to insect stings.   REVIEW OF SYSTEMS:  Exertional dyspnea, productive cough, acid  indigestion, some difficulty swallowing, headaches, sneezing and  itching, earaches, occasional anxiety and depression.   PAST HISTORY:  1. Mitral valve prolapse.  2. Pneumonia as an infant.  Never hospitalized for lung disease.  She      had pneumococcal vaccine 2 years ago.  3. Previous sinusitis.  4. Anemia in the past without transfusion.  5. Elevated cholesterol.  6. Breast biopsy.  7. Hemorrhoidectomy.   SOCIAL HISTORY:  Quit smoking in 1990, rare alcohol.  She is married  with children.  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  She has  been caring for her mother who is now in critical care at [facility] with pneumonia.   ENVIRONMENT:  There are cats in the home.  They have a storage basement,  central air conditioning, carpeted floors with plans to replace carpet  with hard wood.  She does have encasing on the bedding.   OBJECTIVE:  VITAL SIGNS:  Weight 147 pounds, BP 114/62, pulse 62, room  air saturation 95%.  GENERAL:  Medium build, alert, no distress.  SKIN:  No rash.  ADENOPATHY:  None found.  HEENT:  Palate spacing 3-4/4.  There is a slight raspy quality during  vocalized expiration.  No stridor.  She is not really hoarse.  I do not  see evidence of drainage.  The pharynx is not red.  Thyroid does not  feel enlarged.  HEART:  I question a mid systolic click but hear no murmur.  LUNGS:  Fields are clear.  ABDOMEN:  Without hepatosplenomegaly.  EXTREMITIES:  Without cyanosis, clubbing, or edema.   IMPRESSION:  1. Strong suggestion that her cough is primarily related to reflux.      Her peppermints are the worst thing she could be doing because they      tend to relax the lower esophageal sphincter and this point will be      re-emphasized going forward.  2. Question of chronic obstructive pulmonary disease  raised by      hyperinflation on chest x-ray.   PLAN:  PFTs, schedule return in 1 month, earlier p.r.n.   I appreciate the chance to meet her.     Clinton D. Maple Hudson, MD, [facility]ollman, FACP  Electronically Signed    CDY/MedQ  DD: 07/26/2007  DT: 07/26/2007  Job #: [id number]   cc:   Ernestina Penna, M.D.

## 2011-05-07 NOTE — Consult Note (Signed)
[name], [name]              ACCOUNT NO.:  [id number]   MEDICAL RECORD NO.:  [id number]          PATIENT TYPE:  INP   LOCATION:  4735                         FACILITY:  [facility]   PHYSICIAN:  Lucita Ferrara, MD         DATE OF BIRTH:  October 20, 1935   DATE OF CONSULTATION:  DATE OF DISCHARGE:                                 CONSULTATION   The patient is a 76 year old.  The patient comes in with chest pain,  which started earlier in the a.m., located left anterior precordial  area, described as pressure like  and intensity 10/10, radiates to her  back and shoulders, positive cough/shortness of breath, but patient is  chronically short of breath secondary to asthma, asthmatic bronchitis.  she was also diaphoretic.  At that time, patient presented to the PMD  office.  Note, her PMD is Dr. Dimple Casey, who has left the practice, although  Dr. Christell Constant [place]  When she presents at the PMD's office, the PA did an EKG and  sent her to the emergency room by EMS.  The patient had 2 nitros at  PMD's and 4 aspirins.  Chest pain has happened before and always  associated with coughing spells.  The chest pain is not associated  with food or gastroesophageal reflux disease.  Chest pain  nonreproducible.  Also no change in the position.  Denies any orthopnea  or paroxysmal nocturnal dyspnea.   PAST MEDICAL HISTORY:  Significant for:  1. Asthma with treated with Pulmicort at home and also with Singulair.  2. Gastroesophageal reflux disease.  3. IBS.  4. Mitral valve prolapse.   REVIEW OF SYSTEMS:  Otherwise negative.  Denies abdominal pain,  diarrhea.  No URI symptoms.  No urinary symptoms.  She has wheezing and  has a cough.   ALLERGIES:  ALLERGIC TO PENICILLIN.  SHE GETS JOINT PAIN APPARENTLY.   PAST SURGICAL HISTORY:  She has had hemorrhoidectomy.   SOCIAL HISTORY:  She is a nonsmoker.  She quit smoking in 1990.  She had  been smoking since  age 48 at that time.   PHYSICAL EXAM:  GENERAL:  Generally speaking, patient is currently in no acute distress.  HEENT:  Normocephalic, atraumatic.  Sclerae anicteric.  NECK:  Supple.  No JVD.  No carotid bruits.  CARDIOVASCULAR:  S1, S2.  Regular rate and rhythm.  No rubs or clicks.  There is a 2/6 diastolic murmur noted.  VITAL SIGNS:  Temperature 98.2, pulse 80, blood pressure 120/82,  respirations 18, pulse ox 99% on room air.  ABDOMEN:  Soft, nontender, nondistended.  Positive bowel sounds.  EXTREMITIES:  No clubbing, cyanosis or edema.    EKG shows normal sinus rhythm.  Foot T waves in the anterior leads.  No  change from May 08, 2007.  A chest x-ray shows a left lower lobe  scarring.  CBC within normal limits.  Hemoglobin 11.1.  Hematocrit 33.2.  Complete metabolic panel within normal limits.  Troponin 0.05.  First  set of cardiac enzymes is negative.  All labs, including cardiac enzymes- Nornal.   ASSESSMENT AND PLAN:  This is a 76 year old with chest pain, very  atypical given the association with cough, but given new EKG changes, we  will keep her and rule out non-ST elevated myocardial ischemia or  infarction.  1. Chest pain.  Cardiac enzymes x3 every 6 hours.  Aspirin/beta blocker/oxygen 2 liters nasal cannula to keep the pulse ox  above 93%  A 2D echo in the morning.  A thallium stress test in the morning.  Spoke with [facility].  The patient is on the schedule.  Prophylaxis with Lovenox 40 mg subcu daily.  1. For asthma/bronchitis.  Continue Singulair 10 mg daily.  Albuterol/Atrovent every 4-6 hours.  Empirically treat with Zithromax daily.  1. For other medications the patient is taking at home, continue      Prozac 10 mg p.o. daily and Singulair 10 mg p.o. daily.      Prophylaxis with Lovenox for DVT and Protonix 40 mg for GI.  I have      explained all plans and procedures of this admission to this      patient and patient's husband in the room.   The patient      understands.  The patient is a full code.      Lucita Ferrara, MD  Electronically Signed     RR/MEDQ  D:  10/30/2007  T:  10/31/2007  Job:  [id number]

## 2011-05-07 NOTE — Assessment & Plan Note (Signed)
 HEALTHCARE                             PULMONARY OFFICE NOTE   NAME:[name], [name]                     MRN:          [id number]  DATE:11/27/2007                            DOB:          [birth year]    PROBLEMS:  1. Cough.  2. Esophageal reflux.  3. History of mitral valve prolapse.   HISTORY:  She was hospitalized for a day in early November by the  hospitalist and was discharged with diagnoses of chest pain, resolved,  osteoporosis, hypomagnesemia, esophageal reflux disease, asthmatic  bronchitis, irritable bowel syndrome, and mitral valve prolapse. Chest  pain had resolved. Summary indicates that a chest x-ray was negative.  Cardiac enzymes were negative. EKG had some flipped T-waves in anterior  waves, apparently not new. She also says that a nuclear stress test was  normal on November 17 for [facility]. She is less frequently  aware of reflux. She sometimes notice a little shortness of breath. She  is not sure how well her medicines really are helping her. She thought  that Pulmicort was helpful, but then it no longer seemed to support her.   MEDICATIONS:  1. Singulair 10 mg.  2. Prozac 20 mg.  3. Nexium 40 mg.  4. Evista.  5. Vitamins.  6. Zyrtec 10 mg p.r.n.  7. Occasional Mucinex.   She has dropped off of inhalers.   Drug intolerant PENICILLIN.   OBJECTIVE:  Weight 144 pounds, blood pressure 142/78, pulse 60, room air  saturation 97%. Lung fields are clear. Heart sounds are regular and  normal. There is dry cough with deep breath. Throat looks clear. Nasal  airway is clear.   IMPRESSION:  Mild intermittent asthma. I suspect reflux is an important  part of her problem. Note that pulmonary function testing here on  08/27/2007 had shown mild obstructive airways disease in the small airways  where there was some response to bronchodilator. Lung volumes suggested  hyperinflation with air trapping. Diffusion was mildly reduced.  Her  overall FEV1 was high normal at 127 with FEV1/FVC ratio of 0.79. On a  six minute walk test, she went 504 meters with normal cardiopulmonary  response holding oxygen saturation 95% to 97% and heart rate rising from  62 to 87 and dropping back to 66 two minutes later.   PLAN:  Reflux precautions. A copy of the recent discharge summary to  being forwarded to Dr. Vernon Prey for his record. Flu vaccine discussed  and given. Schedule return 4 months, earlier p.r.n.     Clinton D. Maple Hudson, MD, Tonny Bollman, FACP  Electronically Signed    CDY/MedQ  DD: 11/29/2007  DT: 11/30/2007  Job #: [id number]   cc:   Ernestina Penna, M.D.

## 2011-05-07 NOTE — Assessment & Plan Note (Signed)
 HEALTHCARE                             PULMONARY OFFICE NOTE   NAME:[name], [name]                     MRN:          [id number]  DATE:08/27/2007                            DOB:          [birth year]    PROBLEM LIST:  1. Cough.  2. Esophageal reflux.  3. History of mitral valve prolapse.   HISTORY:  Still coughing and finds that albuterol seems to make her  cough.  Few colds.  She thinks she has allergy but does not define  this well.  She used to note a seasonal rhinitis and some drainage which  are now persistent through the year.  Since we talked, she is  occasionally aware of reflux episodes.  Her chest x-ray by Dr. Christell Constant had  shown some hyperinflation suggestive of COPD in April of last year per  Dr. Christell Constant.   MEDICATIONS:  1. Singulair.  2. Prozac.  3. Nexium.  4. Zantac.  5. Evista.  6. Vitamins.  7. Omega-3.  8. Zyrtec.   ALLERGIES:  PENICILLIN.   OBJECTIVE:  VITAL SIGNS:  Weight 148 pounds, blood pressure 114/70,  pulse 62, room air saturation 95%.  GENERAL:  She had a spell of hard coughing which then settled down and  after that her chest was clear and quiet.  Pharynx is not reddened.  Voice quality was normal.  I saw no evidence of nasal congestion or  postnasal drip.  Pulmonary function testing showed mild obstructive  airways disease with response to bronchodilator.  Her FEV1 was normal.  Lung volumes were a little increased suggesting possible hyperinflation  and air trapping, although, they were in the range of scores associated  with her FEV1 and FVC, so she may just have large lungs.  Diffusion was  mildly reduced at 76%.  On a 6-minute walk test, she went 504 meters in  6 minutes with normal heart rate and blood pressure response.  She  maintained her oxygen saturation 95-97% through the exercise indicating  unremarkable cardiopulmonary reserve for that level of activity.   IMPRESSION:  1. Cough probably with  episodic tracheobronchitis and minimal asthma.  2. Probable episodic reflux.  3. Mild frenial allergic rhinitis.   PLAN:  1. Emphasis on reflux precautions and over-the-counter therapy.  2. Since albuterol caused cough, we are replacing that with a trial of      maintenance suppression with Pulmicort twisthaler two puffs b.i.d.      with instructions.  Schedule return in 3 months, earlier p.r.n.     Clinton D. Maple Hudson, MD, Tonny Bollman, FACP  Electronically Signed    CDY/MedQ  DD: 08/30/2007  DT: 08/30/2007  Job #: [id number]   cc:   Ernestina Penna, M.D.

## 2011-05-10 NOTE — H&P (Signed)
NAME:  [name], [name]                        ACCOUNT NO.:  [id number]   MEDICAL RECORD NO.:  [id number]                   PATIENT TYPE:  EMS   LOCATION:  MAJO                                 FACILITY:  [facility]   PHYSICIAN:  Arturo Morton. Riley Kill, M.D.             DATE OF BIRTH:  05/28/1935   DATE OF ADMISSION:  10/11/2003  DATE OF DISCHARGE:                                HISTORY & PHYSICAL   CHIEF COMPLAINT:  Chest discomfort.   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  Over the past several days, she has had some  shooting pain from the right shoulder across the chest that was about 5/10.  This was short lived, but there was some queasiness noted with this. She  also had some discomfort in the jaw, although it should be noted that she  has a history of TMJ in the past.  The discomfort then shot across the left  chest, and there was some dull pain.  Last night, she was awakened from  sleep.  She has been under quite a bit of stress taking care of her 90-year-  old mother.  EKG did not reveal any significant abnormalities.  She was  subsequently transferred here for further evaluation and therapy.   PAST MEDICAL HISTORY:  1. History of gastroesophageal reflux which she describes as slightly     different.  2. Reflux-induced asthma.  3. Dyslipidemia with elevated cholesterol but high HDL.  4. Mitral valve prolapse.   Specific negatives included no history of hypertension or diabetes.  The  patient was a former smoker but quit in 1990.   ALLERGIES:  PENICILLIN.   MEDICATIONS:  1. Prevacid 30 mg daily.  2. Singulair 10 mg a day.  3. Zyrtec b.i.d.  4. Prozac 20 mg daily.  5. Omega-3 1000 mg t.i.d.  6. Flax seed oil 1000 mg t.i.d.  7. Calcium 500 mg t.i.d.  8. Folic acid 800 mcg daily.  9. Vitamin C 500 mg daily.  10.      Vitamin B12  1000 mg daily.   FAMILY HISTORY:  Her mother is 2 and alive.  Her father died of an accident  at a young age.  She has a brother who has had bypass surgery and one  brother with hypercholesterolemia.  She has one sister who is alive and well  with elevated cholesterol.   SOCIAL HISTORY:  The patient lives in [place].  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  She  does not smoke currently.  She drinks only socially.   REVIEW OF SYSTEMS:  Remarkable for some hip arthralgias and gastroesophageal  reflux.  She has occasional dizziness at times.  She has occasional  palpitations.  Review of  Systems is otherwise negative.   PHYSICAL EXAMINATION:  GENERAL:  The patient is an alert, oriented female in  no acute distress.  She is alert and oriented x 3.  She is fit looking.  She  denies any ongoing chest pain at the present time.  VITAL SIGNS:  Temperature 98.1, pulse 69, respiratory rate 20, blood  pressure 118/54, and the pulses are equal bilaterally.  HEENT:  Extraocular muscles are intact.  Pupils equal and reactive to light  and accommodation. Sclerae are clear.  Carotid upstrokes are brisk  bilaterally.  There are no bruits.  LUNGS:  Fields are clear to auscultation and percussion.  CARDIAC:  Rhythm is regular.  No significant murmur is appreciated.  S1 and  S2 are normal.  No gallops are noted.  ABDOMEN:  Soft.  NEUROLOGIC:  Nonfocal.   LABORATORY DATA:  1. EKG reveals normal sinus rhythm with minimal nonspecific ST-T     abnormalities.  2. Chest x-ray reveals no obvious active disease.  There are some bilateral     basilar atelectasis.  3. Hemoglobin 11.8, hematocrit 35, white count 4200, platelet count 223,000.     BUN 13, creatinine 0.9, potassium 4.1, glucose 91.  Liver function     studies are intact.   IMPRESSION:  1. Atypical chest pain with some abnormal features but no exertion-related     symptoms.  2.  Gastroesophageal reflux.  3. Dyslipidemia.  4. Smoker many years ago.   PLAN:  1. Admit with serial enzymes.  2. No definite treatment at present.  3. Stress Cardiolite if enzymes are negative.                                                Arturo Morton. Riley Kill, M.D.    TDS/MEDQ  D:  10/11/2003  T:  10/11/2003  Job:  [id number]   cc:   Arturo Morton. Riley Kill, M.D.   Lalla Brothers, M.D.  [facility]

## 2011-05-10 NOTE — Procedures (Signed)
[facility]  Patient:    [name], [name]                     MRN: [id number] Proc. Date: 01/22/01 Adm. Date:  [id number] Attending:  Nelda Marseille CC:         Montey Hora, M.D.   Procedure Report  PROCEDURE:  Colonoscopy with biopsy.  INDICATION:  Bright red blood per rectum.  Guaiac positivity.  Family history of colon cancer.  PROCEDURE: The consent was signed after risks, benefits, methods, and options were thoroughly discussed in the office.  MEDICINES USED:  Demerol 50 and Versed 6.  PROCEDURE:  Rectal inspection for internal and external hemorrhoids.  Digital exam was negative.  The video pediatric colonoscope was inserted with some difficulty due to a tortuous colon.  We were able to advance to the cecum. This did require multiple abdominal pressures and rolling her on her back. The cecum was identified by the appendiceal orifice and the ileocecal valve. In fact, the scope was inserted for a short way into the terminal ileum, which was normal.  Photo documentation was obtained.  The prep was adequate.  There was some liquid stool that required suctioning, but on slow withdrawal through the colon, we slowly withdrew back to the rectum.  No abnormalities were seen on either insertion or slow withdrawal.  We fell back around the tortuous loop. We did try to advance around the curve again to rule out the chances of missing anything, but no abnormalities were seen.  Once back in the rectum, the scope was retroflexed ______ internal hemorrhoids, significant.  One seemed to be more polypoid and had some inflammation on it.  We did probe it, and it did not seem quite as soft as the customary hemorrhoids.  We went ahead and took one small biopsy, which did have some mild amount of bleeding.  The scope was then straightened, and anorectal pull-through confirmed the hemorrhoids.  No significant bleeding was seen.  The scope was reinserted  a short ways into the sigmoid.  Air was suctioned, and the scope was removed. The patient tolerated the procedure well.  There was no evidence of any complications.  ENDOSCOPIC DIAGNOSES: 1. Significant internal greater than external hemorrhoid. 2. Questionable anorectal polyp versus inflammation of hemorrhoids - status    post one biopsy. 3. Tortuous colon. 4. Enlargement within normal limits to the terminal ileum.  PLAN: 1. Await pathology. 2. Surgical consult p.r.n. 3. Followup p.r.n. or in two months to recheck symptoms and guaiacs and    and make sure no further workup plans are noted. 4. Probably will treat hemorrhoids while repeating guaiacs. DD:  01/22/01 TD:  01/22/01 Job: [id number]

## 2011-05-10 NOTE — Op Note (Signed)
[name], [name]              ACCOUNT NO.:  [id number]   MEDICAL RECORD NO.:  [id number]          PATIENT TYPE:  AMB   LOCATION:  DAY                          [facility]ACILITY[facility]  [facility]   PHYSICIAN:  Ollen Gross. Vernell Morgans, M.D. DATE OF BIRTH:  10/03/1935   DATE OF PROCEDURE:  05/10/2005  DATE OF DISCHARGE:                                 OPERATIVE REPORT   PREOPERATIVE DIAGNOSES:  External hemorrhoid.   POSTOPERATIVE DIAGNOSES:  Internal and external hemorrhoids.   PROCEDURE:  Hemorrhoidectomy.   SURGEON:  Ollen Gross. Carolynne Edouard, M.D.   ANESTHESIA:  General endotracheal with local.   DESCRIPTION OF PROCEDURE:  After informed consent was obtained, the patient  was brought to the operating room, placed in a supine position on the  operating table. After adequate induction of general anesthesia, the patient  was placed in lithotomy position, perirectal area was prepped with Betadine,  draped in the usual sterile manner. The area of the hemorrhoid and  perirectal region was infiltrated with 0.25% Marcaine with 1 mL of Wydase  and the tissue was massaged gently. A bullet type retractor was inserted in  the rectum and the rectum was examined circumferentially. The main area of  hemorrhoidal tissue was located anteriorly where a large hemorrhoidal  complex, both internal and external hemorrhoidal tissue was identified. The  hemorrhoid itself was grasped proximally and distally with Allis clamps. The  mucosa and skin around the hemorrhoid was then incised near the base of the  hemorrhoid with a 15 blade knife. A hemostat was then placed across the  vessels feeding the hemorrhoid and clamped. The rest of the hemorrhoid was  then removed sharply with the Metzenbaum scissors. The wound was then closed  with a running 2-0 chromic stitch. Using the hemostat for hemostasis and to  help align the tissue, the last few millimeters of the wound were left open.  Two other interrupted figure-of-eight 2-0 chromic  stitches were placed along  the suture line for hemostasis purposes. Once this was accomplished, the  wound was examined and found to be hemostatic and the tissue was nicely  approximated. No other significantly large hemorrhoidal tissue was  identified.   The region was then infiltrated with another 10 mL of 0.25% Marcaine with  epinephrine and the patient still had good rectal tone. Lidocaine jelly was  then placed within the rectum along with a small piece of Gelfoam and  sterile dressings were applied. The patient tolerated the procedure well. At  the end of the case, all needle, sponge and instrument counts were correct.  The patient was then awakened and taken to the recovery room in stable  condition.      PST/MEDQ  D:  05/10/2005  T:  05/10/2005  Job:  [id number]

## 2011-05-21 ENCOUNTER — Other Ambulatory Visit: Payer: Self-pay | Admitting: Obstetrics & Gynecology

## 2011-05-21 DIAGNOSIS — Z1231 Encounter for screening mammogram for malignant neoplasm of breast: Secondary | ICD-10-CM

## 2011-05-22 NOTE — Discharge Summary (Signed)
[name], [name]              ACCOUNT NO.:  [id number]  MEDICAL RECORD NO.:  [id number]           PATIENT TYPE:  I  LOCATION:  1409                         FACILITY:  [facility]  PHYSICIAN:  Brendia Sacks, MD    DATE OF BIRTH:  23-Jun-1935  DATE OF ADMISSION:  04/16/2011 DATE OF DISCHARGE:  04/27/2011                              DISCHARGE SUMMARY   PRIMARY CARE PHYSICIAN:  Ernestina Penna, M.D. at [facility].  GASTROENTEROLOGIST:  Anselmo Rod, MD, [facility] with [facility].  DISCHARGE DIAGNOSES: 1. Severe clostridium difficile colitis, now resolved. 2. Hospital acquired pneumonia, likely aspiration pneumonia, improved. 3. Acute delirium associated with her pneumonia. 4. Dysphagia with possible aspiration. 5. Chronic obstructive pulmonary disease. 6. Gastroesophageal reflux disease. 7. Rash secondary to adverse drug reaction, possibly from Benadryl     cream. 8. Hyperglycemia. 9. Depression. 10.Chronic gastrointestinal upset.  CONDITION ON DISCHARGE:  Condition at the time of discharge:  The patient is alert and oriented.  She is comfortable, however, she is very weak.  She is a only able to go to bathroom with assistance.  She is able to ambulate less than 300 feet at a time with a walker.  Her diarrhea has decreased to just once a day.  She does continue to have a chronic cough.  HISTORY AND BRIEF HOSPITAL COURSE:  This is a very pleasant 76 year old female who presented to the hospital with a chief complaint of diarrhea, nausea and vomiting that has been worsening since she underwent a HIDA scan to evaluate persistent abdominal symptoms.  Prior to coming to the hospital, the patient was given Phenergan and IV fluids by her primary care physician but she continued to worsen and consequently came to the hospital for evaluation and possible admission. 1. Severe C diff colitis.  On admission the patient was having 10 to     12  stools daily.  She was placed on IV Flagyl, p.o. vancomycin and     probiotics as well as IV fluids.  A CT of her abdomen and pelvis     was done on April 17, 2011, that showed diffuse colitis involving     the entire colon with colonic wall thickening and extensive soft     tissue stranding.  After approximately 6 days of heavy antibiotic     therapy, the patient's C diff colitis improved significantly and     today she is down to one diarrhea stool daily.  She will be     discharged to home on p.o. vancomycin and will follow up with Dr.     Charna Elizabeth. 2. Healthcare acquired pneumonia.  During her hospitalization on April 21, 2011, the patient developed acute delirium and agitation.  She     was transferred to the step-down unit.  She was found to have     confluent bibasilar opacity on chest x-ray on Apr 23, 2011.  She was     treated with p.o. Avelox as well at as IV vancomycin.  The IV     vancomycin was specifically started because she had  a nasal swab     that was positive for MRSA.  On Apr 24, 2011, the patient was able     to be transferred to the floor as her clinical status had improved     significantly and her delirium had completely resolved.  Because     the pneumonia was felt to have been caused by aspiration, the     patient received a speech evaluation by speech therapy in the     hospital.  Diet recommendations were given and aspiration     precaution education was provided.  It is also recommended that Dr.     Charna Elizabeth see the patient in followup for possible esophageal     evaluation. 3. COPD.  During her stay, the patient was maintained on COPD     medications including her nebulizers and Singulair.  No steroids     were required during her hospitalization. 4. GERD.  The patient had previously been on Nexium b.i.d. as well as     Zantac.  Her Nexium will be discontinued at discharge as it leaves     her more vulnerable to C diff colitis.  The patient will be      maintained on Pepcid as an outpatient.  The rest of the patient's comorbidities were quiet during this hospitalization.  MICROBIOLOGY:  C diff by PCR was positive, collected on April 17, 2011. Urine culture collected on April 17, 2011, was negative.  SIGNIFICANT LABORATORY DATA:  Hemoglobin A1c obtained April 18, 2011, was 6.  Mean plasma glucose 126.  MRSA PCR screening by nasal swab was positive, April 21, 2011.  The patient's hemoglobin has been in the 9.7 to 10.8 range throughout this admission.  PHYSICAL EXAMINATION:  GENERAL:  Mr. Faxon is alert and oriented.  She is calm and pleasant.  However, she tells me she feels very and very weak. VITAL SIGNS:  Temperature 98.3, pulse 92, respirations 18, blood pressure 158/76, O2 sat 90% on 2 L. HEENT:  Head is atraumatic, normocephalic.  Eyes are anicteric with pupils equal, round, reactive to light. NECK:  Supple with midline trachea.  No JVD.  No lymphadenopathy. CHEST:  Demonstrates no accessory muscle use.  She has no wheezes or crackles.  She does have a productive cough. HEART:  Regular rate and rhythm without murmurs, rubs or gallops. ABDOMEN:  Soft but slightly distended with active bowel sounds. EXTREMITIES:  Showed no edema.  DISCHARGE MEDICATIONS: 1. Flora-Q 1 capsule by mouth twice daily. 2. Avelox 400 mg 1 capsule by mouth for 2 more days. 3. Vancomycin 125 mg by mouth 4 times a day for 16 days. 4. Advair Diskus 500/50 one puff inhaled twice daily. 5. Biotin one application topically as needed. 6. Calcium with zinc 1 tablet by mouth twice daily. 7. Evista 60 mg 1 tablet by mouth daily. 8. Fenofibrate 160 mg 1 tablet by mouth daily. 9. Flexeril 10 mg 1 tablet at bedtime as needed for pain. 10.Flonase nasal spray 1 spray both nostrils as needed. 11.Hyomax-SR 0.375 mg 1/2 to 1 tablet by mouth twice daily as needed. 12.Promethazine 25 mg 1 tablet q.6 h as needed for nausea. 13.Prozac 20 mg 2 capsules by mouth  daily. 14.Simethicone 80 mg 1 tablet by mouth 4 times a day as needed for     gas. 15.Singulair 10 mg 1 tablet by mouth daily. 16.Spiriva 18 mcg 1 capsule by mouth as needed for shortness of     breath. 17.Tramadol 50 mg  1 tablet by mouth every 6 hours as needed for pain. 18.Ventolin inhaler 1 to 2 puffs inhaled every 4 hours as needed. 19.Vitamin D 1000 units 1 tablet by mouth twice daily. 20.Zantac 150 mg 1 tablet at bedtime and during the day if needed.  Stop taking the following medications:  Nexium 40 mg 1 capsule by mouth daily.  PENDING STUDIES:  None.  SUGGESTIONS FOR OUTPATIENT FOLLOWUP: 1. The patient will see Dr. Rudi Heap in followup for C diff     colitis.  It is noteworthy that her oxygen saturation during the     hospitalization has been low, currently she has an O2 sat of 90% on     2 L and desats down into the 80s without oxygen.  This will be     reassessed just prior to discharge and she may go home on oxygen     therapy if needed.  This should be reassessed by Dr. Christell Constant on     followup as an outpatient. 2. The patient has an appointment to see Dr. Charna Elizabeth.  It was     recommended that she receive followup not only for her C diff     colitis but also for her swallowing  with a possible esophageal     evaluation.  DISCHARGE INSTRUCTIONS:  The patient is to increase activity slowly. She can walk with a walker and assistance.  As far as diet, she is to eat soft solids, follow aspiration precaution instructions that were given to her in the hospital.  FOLLOWUP APPOINTMENTS:  She will see Dr. Rudi Heap at [facility] on May 03, 2011, at 11:00 a.m.  She will see Dr. Charna Elizabeth within the next 2 to 3 weeks.  She needs to call for an appointment.  The patient understands that if she has any increase in the number of diarrhea stools, dizziness, shortness of breath or chest pain, she is to return to the emergency  department.     Stephani Police, PA   ______________________________ Brendia Sacks, MD    MLY/MEDQ  D:  04/26/2011  T:  04/26/2011  Job:  [id number]  cc:   Ernestina Penna, M.D. Fax: [phone]  WGNFAO ZHY QMVH, MD, [facility] Fax: [phone]  Electronically Signed by Algis Downs PA on 05/21/2011 07:49:23 PM Electronically Signed by Brendia Sacks  on 05/22/2011 07:44:22 AM

## 2011-05-23 ENCOUNTER — Observation Stay (HOSPITAL_COMMUNITY)
Admission: EM | Admit: 2011-05-23 | Discharge: 2011-05-27 | Disposition: A | Discharge status: Home / Self Care | Payer: Medicare Other | Attending: Internal Medicine | Admitting: Internal Medicine

## 2011-05-23 DIAGNOSIS — Z79899 Other long term (current) drug therapy: Secondary | ICD-10-CM | POA: Insufficient documentation

## 2011-05-23 DIAGNOSIS — K219 Gastro-esophageal reflux disease without esophagitis: Secondary | ICD-10-CM | POA: Insufficient documentation

## 2011-05-23 DIAGNOSIS — J4489 Other specified chronic obstructive pulmonary disease: Secondary | ICD-10-CM | POA: Insufficient documentation

## 2011-05-23 DIAGNOSIS — Z880 Allergy status to penicillin: Secondary | ICD-10-CM | POA: Insufficient documentation

## 2011-05-23 DIAGNOSIS — F341 Dysthymic disorder: Secondary | ICD-10-CM | POA: Insufficient documentation

## 2011-05-23 DIAGNOSIS — A0472 Enterocolitis due to Clostridium difficile, not specified as recurrent: Principal | ICD-10-CM | POA: Insufficient documentation

## 2011-05-23 DIAGNOSIS — R1310 Dysphagia, unspecified: Secondary | ICD-10-CM | POA: Insufficient documentation

## 2011-05-23 DIAGNOSIS — R918 Other nonspecific abnormal finding of lung field: Secondary | ICD-10-CM | POA: Insufficient documentation

## 2011-05-23 DIAGNOSIS — R109 Unspecified abdominal pain: Secondary | ICD-10-CM | POA: Insufficient documentation

## 2011-05-23 DIAGNOSIS — K589 Irritable bowel syndrome without diarrhea: Secondary | ICD-10-CM | POA: Insufficient documentation

## 2011-05-23 DIAGNOSIS — D649 Anemia, unspecified: Secondary | ICD-10-CM | POA: Insufficient documentation

## 2011-05-23 DIAGNOSIS — E876 Hypokalemia: Secondary | ICD-10-CM | POA: Insufficient documentation

## 2011-05-23 DIAGNOSIS — J449 Chronic obstructive pulmonary disease, unspecified: Secondary | ICD-10-CM | POA: Insufficient documentation

## 2011-05-23 DIAGNOSIS — Z8701 Personal history of pneumonia (recurrent): Secondary | ICD-10-CM | POA: Insufficient documentation

## 2011-05-23 DIAGNOSIS — E785 Hyperlipidemia, unspecified: Secondary | ICD-10-CM | POA: Insufficient documentation

## 2011-05-24 LAB — COMPREHENSIVE METABOLIC PANEL WITH GFR
ALT: 21 U/L (ref 0–35)
AST: 23 U/L (ref 0–37)
Albumin: 3.5 g/dL (ref 3.5–5.2)
Alkaline Phosphatase: 63 U/L (ref 39–117)
BUN: 17 mg/dL (ref 6–23)
CO2: 22 meq/L (ref 19–32)
Calcium: 9.2 mg/dL (ref 8.4–10.5)
Chloride: 96 meq/L (ref 96–112)
Creatinine, Ser: 0.82 mg/dL (ref 0.4–1.2)
GFR calc non Af Amer: >60 mL/min
Glucose, Bld: 104 mg/dL — ABNORMAL HIGH (ref 70–99)
Potassium: 3 meq/L — ABNORMAL LOW (ref 3.5–5.1)
Sodium: 133 meq/L — ABNORMAL LOW (ref 135–145)
Total Bilirubin: 0.3 mg/dL (ref 0.3–1.2)
Total Protein: 6.7 g/dL (ref 6.0–8.3)

## 2011-05-24 LAB — CBC
HCT: 32.6 % — ABNORMAL LOW (ref 36.0–46.0)
Hemoglobin: 10.8 g/dL — ABNORMAL LOW (ref 12.0–15.0)
MCH: 28.4 pg (ref 26.0–34.0)
MCHC: 33.1 g/dL (ref 30.0–36.0)
MCV: 85.8 fL (ref 78.0–100.0)
Platelets: 313 K/uL (ref 150–400)
RBC: 3.8 MIL/uL — ABNORMAL LOW (ref 3.87–5.11)
RDW: 15.3 % (ref 11.5–15.5)
WBC: 13 K/uL — ABNORMAL HIGH (ref 4.0–10.5)

## 2011-05-24 LAB — DIFFERENTIAL
Basophils Absolute: 0 K/uL (ref 0.0–0.1)
Basophils Relative: 0 % (ref 0–1)
Eosinophils Absolute: 0 K/uL (ref 0.0–0.7)
Eosinophils Relative: 0 % (ref 0–5)
Lymphocytes Relative: 5 % — ABNORMAL LOW (ref 12–46)
Lymphs Abs: 0.6 K/uL — ABNORMAL LOW (ref 0.7–4.0)
Monocytes Absolute: 0.8 K/uL (ref 0.1–1.0)
Monocytes Relative: 6 % (ref 3–12)
Neutro Abs: 11.5 K/uL — ABNORMAL HIGH (ref 1.7–7.7)
Neutrophils Relative %: 89 % — ABNORMAL HIGH (ref 43–77)

## 2011-05-24 LAB — URINALYSIS, ROUTINE W REFLEX MICROSCOPIC
Bilirubin Urine: NEGATIVE
Hgb urine dipstick: NEGATIVE
Nitrite: NEGATIVE
Specific Gravity, Urine: 1.014 (ref 1.005–1.030)
Urobilinogen, UA: 0.2 mg/dL (ref 0.0–1.0)

## 2011-05-24 LAB — MRSA PCR SCREENING: MRSA by PCR: NEGATIVE

## 2011-05-24 LAB — LIPASE, BLOOD: Lipase: 113 U/L — ABNORMAL HIGH (ref 11–59)

## 2011-05-24 LAB — TSH: TSH: 2.176 u[IU]/mL (ref 0.350–4.500)

## 2011-05-24 LAB — LACTIC ACID, PLASMA: Lactic Acid, Venous: 1.8 mmol/L (ref 0.5–2.2)

## 2011-05-25 ENCOUNTER — Inpatient Hospital Stay (HOSPITAL_COMMUNITY): Payer: Medicare Other

## 2011-05-25 LAB — DIFFERENTIAL
Basophils Absolute: 0 10*3/uL (ref 0.0–0.1)
Eosinophils Relative: 0 % (ref 0–5)
Lymphocytes Relative: 6 % — ABNORMAL LOW (ref 12–46)
Lymphs Abs: 0.8 10*3/uL (ref 0.7–4.0)
Monocytes Absolute: 1.2 10*3/uL — ABNORMAL HIGH (ref 0.1–1.0)
Neutro Abs: 11.1 10*3/uL — ABNORMAL HIGH (ref 1.7–7.7)

## 2011-05-25 LAB — COMPREHENSIVE METABOLIC PANEL
ALT: 14 U/L (ref 0–35)
Alkaline Phosphatase: 50 U/L (ref 39–117)
BUN: 7 mg/dL (ref 6–23)
CO2: 22 mEq/L (ref 19–32)
GFR calc non Af Amer: >60 mL/min (ref >60)
Glucose, Bld: 104 mg/dL — ABNORMAL HIGH (ref 70–99)
Potassium: 3.9 mEq/L (ref 3.5–5.1)
Sodium: 136 mEq/L (ref 135–145)
Total Bilirubin: 0.3 mg/dL (ref 0.3–1.2)

## 2011-05-25 LAB — CBC
HCT: 28.9 % — ABNORMAL LOW (ref 36.0–46.0)
Hemoglobin: 9.3 g/dL — ABNORMAL LOW (ref 12.0–15.0)
MCV: 87.8 fL (ref 78.0–100.0)
WBC: 13 10*3/uL — ABNORMAL HIGH (ref 4.0–10.5)

## 2011-05-25 LAB — URINE CULTURE

## 2011-05-26 LAB — BASIC METABOLIC PANEL
CO2: 24 mEq/L (ref 19–32)
Chloride: 109 mEq/L (ref 96–112)
GFR calc Af Amer: >60 mL/min (ref >60)
Glucose, Bld: 81 mg/dL (ref 70–99)
Potassium: 4.2 mEq/L (ref 3.5–5.1)
Sodium: 138 mEq/L (ref 135–145)

## 2011-05-26 LAB — CBC
Hemoglobin: 9.2 g/dL — ABNORMAL LOW (ref 12.0–15.0)
MCH: 28.6 pg (ref 26.0–34.0)
MCV: 88.5 fL (ref 78.0–100.0)
Platelets: 226 10*3/uL (ref 150–400)
RBC: 3.22 MIL/uL — ABNORMAL LOW (ref 3.87–5.11)
WBC: 9.7 10*3/uL (ref 4.0–10.5)

## 2011-05-26 LAB — DIFFERENTIAL
Basophils Relative: 0 % (ref 0–1)
Eosinophils Absolute: 0.1 10*3/uL (ref 0.0–0.7)
Lymphocytes Relative: 10 % — ABNORMAL LOW (ref 12–46)
Monocytes Relative: 8 % (ref 3–12)
Neutro Abs: 7.8 10*3/uL — ABNORMAL HIGH (ref 1.7–7.7)
Neutrophils Relative %: 81 % — ABNORMAL HIGH (ref 43–77)

## 2011-05-27 LAB — CBC
HCT: 28.5 % — ABNORMAL LOW (ref 36.0–46.0)
Hemoglobin: 9.2 g/dL — ABNORMAL LOW (ref 12.0–15.0)
MCHC: 32.3 g/dL (ref 30.0–36.0)
MCV: 87.7 fL (ref 78.0–100.0)
RDW: 15.8 % — ABNORMAL HIGH (ref 11.5–15.5)
WBC: 5.2 10*3/uL (ref 4.0–10.5)

## 2011-05-27 LAB — BASIC METABOLIC PANEL
BUN: 4 mg/dL — ABNORMAL LOW (ref 6–23)
CO2: 26 mEq/L (ref 19–32)
GFR calc non Af Amer: >60 mL/min (ref >60)
Glucose, Bld: 98 mg/dL (ref 70–99)
Potassium: 4 mEq/L (ref 3.5–5.1)
Sodium: 139 mEq/L (ref 135–145)

## 2011-05-27 LAB — STOOL CULTURE

## 2011-05-30 LAB — CULTURE, BLOOD (ROUTINE X 2)
Culture  Setup Time: 201206010841
Culture: NO GROWTH

## 2011-06-04 ENCOUNTER — Ambulatory Visit
Admission: RE | Admit: 2011-06-04 | Discharge: 2011-06-04 | Disposition: A | Discharge status: Home / Self Care | Payer: Medicare Other | Source: Ambulatory Visit | Attending: Obstetrics & Gynecology | Admitting: Obstetrics & Gynecology

## 2011-06-04 DIAGNOSIS — Z1231 Encounter for screening mammogram for malignant neoplasm of breast: Secondary | ICD-10-CM

## 2011-06-15 ENCOUNTER — Telehealth: Payer: Self-pay | Admitting: Gastroenterology

## 2011-06-15 NOTE — Telephone Encounter (Signed)
On call note @ 1730. Pts husband calls for pt. She has had a return in urgent. watery diarrhea and nausea for 1 day. She has had recurrent C. diff and completed a course of Vancomycin 1 week ago. She was hospitalized previously for this. No fever, chills or abd pain.  Advised a low fat, lactose free diet. Resume Vancomycin 250 mg po qid for 2 week to start and additional treatment plans per Dr. Loreta Ave. Advised to contact Dr. Loreta Ave on Monday for further advice and management.   cc: Dr Charna Elizabeth

## 2011-06-16 ENCOUNTER — Telehealth: Payer: Self-pay | Admitting: Gastroenterology

## 2011-06-16 NOTE — Telephone Encounter (Signed)
On call note at 1530. See yesterdays note. Pts husband states her watery diarrhea has improved but she has had nausea and vomiting today that has not resolved with Zofran 4 mg po q6h prn N/V. No fevers, chills, abd pain or rectal bleeding. Clear liquid diets for 24 hours. increase Zofran to 4-8 mg po q6h prn N/V. Begin promethazine 25 mg supp pr q6h prn N/V,  #12,  no refills. Contact Dr. Kenna Gilbert office tomorrow for further management.  cc: Wilmon Arms, MD

## 2011-06-23 NOTE — H&P (Signed)
[name], [name]              ACCOUNT NO.:  [id number]  MEDICAL RECORD NO.:  [id number]           PATIENT TYPE:  E  LOCATION:  WLED                         FACILITY:  [facility]  PHYSICIAN:  Lonia Blood, M.D.      DATE OF BIRTH:  1935-07-09  DATE OF ADMISSION:  05/23/2011 DATE OF DISCHARGE:                             HISTORY & PHYSICAL   PRIMARY CARE PHYSICIAN:  Ace Gins, MD  PRESENTING COMPLAINT:  Abdominal pain and diarrhea.  HISTORY OF PRESENT ILLNESS:  The patient is a 76 year old female with recent diagnosis of C difficile colitis who was discharged home on May 15, 2011.  She was doing better until today when she started feeling pain in her lower abdomen and the top of her legs.  She has had nausea, but took a pill for the nausea with much relief.  She also started having new diarrhea, just like her previous pain as well as diarrhea. She has gone to the bathroom a number of times already today and was worried, so she came back to the emergency room.  She has completed oral vancomycin apparently at home since her discharge.  The patient is worried that she may be having recurrence of her C difficile, so she decided to come in for further treatment.  She denied any bloody stools. No hematemesis.  No melena.  Pain was rated as 6/10 at the moment.  PAST MEDICAL HISTORY: 1. Recent C difficile colitis. 2. Recent healthcare-associated pneumonia. 3. Mild dysphagia. 4. COPD. 5. GERD. 6. Hyperlipidemia. 7. IBS. 8. Multiple drug allergies. 9. Anemia. 10.Depression.  ALLERGIES:  PENICILLIN.  MEDICATIONS: 1. Fluoxetine 40 mg daily. 2. Evista 60 mg daily. 3. Nexium 40 mg twice a day. 4. Vitamin E 50,000 units daily. 5. Librax 1 capsule p.r.n. 6. Zantac 150 mg daily. 7. Singulair 10 mg q.h.s. 8. Advair Diskus 500/50 mcg 1 puff b.i.d. 9. Flonase 50 mcg daily. 10.Zofran 4 mg p.r.n.  SOCIAL HISTORY:  The patient lives in [place].  She quit tobacco in 1990, but  previously a heavy smoker.  Denied alcohol or IV drug use. 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  FAMILY HISTORY:  Her father died in accidental death at young age. Mother was healthy until she died at the age of 60.  Her brothers and sisters have coronary artery disease as well as GERD.  One of her sisters has severe GI problems related to gallbladder.  REVIEW OF SYSTEMS:  All systems reviewed are negative except per HPI.  PHYSICAL EXAMINATION:  VITAL SIGNS:  Her T-max here is 102.1 orally, blood pressure 124/43 with pulse 88, respiratory rate 16, saturations 100% room air. GENERAL:  She is awake, alert, oriented, pleasant.  She is in no acute distress. HEENT:  PERRL.  EOMI.  No pallor.  No jaundice.  No rhinorrhea. NECK:  Supple.  No JVD.  No lymphadenopathy. RESPIRATORY:  She has good air entry bilaterally.  No wheeze.  No rales. No crackles. CARDIOVASCULAR SYSTEM:  She has S1 and S2.  No murmur. ABDOMEN:  Soft, full, nontender with positive bowel sounds. EXTREMITIES:  No edema,  cyanosis, or clubbing. SKIN:  No rashes.  No ulcers.  LABORATORY DATA:  Her white count is 13,000 with left shift, ANC of 11.5, hemoglobin is 10.8 with an MCV of 85, platelet count of 313. Lactic acid level was 1.8.  Sodium 133, potassium 3.0, chloride 96, CO2 22, glucose 104, BUN 17, creatinine 0.82, calcium 9.2.  Her LFTs within normal.  Lipase is 113.  Trace ketones and small leukocyte esterase in her UA.  Urine microscopy showed wbc's 3-6.  ASSESSMENT:  This is a 76 year old female, presenting with abdominal pain, probably from recurrent Clostridium difficile colitis.  There is possibility of urinary tract infection also causing the symptoms or she may have asymptomatic bacteriuria.  She has no dysuria at the moment. The patient has had recent healthcare-associated pneumonia also.  She also has hypokalemia probably from her nausea.  No vomiting.  PLAN: 1. Possible  recurrent C difficile.  We will admit the patient and this     time give her oral Flagyl if tolerated, plus some probiotics as     needed.  We may reconsult GI if her C difficile PCR comes back     positive.  This may be hatching of previously hiding spores.  I     will give her clear liquids to rest her bowels and then supportive     care with IV fluids. 2. Hypokalemia.  We will replete her potassium appropriately. 3. Possible UTI.  Again, we will consider adding the Cipro or Rocephin     IV if she continues to have fever or if her C difficile assay is     negative. 4. COPD.  She seems stable.  We will continue with her nebulizer. 5. GERD.  Continue with PPIs. 6. Hyperlipidemia.  Continue her home medications. 7. Anemia.  This seems chronic, but stable H and H. 8. Depression and anxiety.  Again, we will resume her home medicine as     soon as practicable, other than that the patient seems stable.  We     will continue with her care and once stable we will send her back     to Dr. Ace Gins.     Lonia Blood, M.D.     Verlin Grills  D:  05/24/2011  T:  05/24/2011  Job:  [id number]  Electronically Signed by Lonia Blood M.D. on 06/23/2011 12:25:05 AM

## 2011-07-21 NOTE — Discharge Summary (Signed)
[name], [name]              ACCOUNT NO.:  [id number]  MEDICAL RECORD NO.:  [id number]  LOCATION:  1333                         FACILITY:  [facility]  PHYSICIAN:  Baltazar Najjar, MD     DATE OF BIRTH:  12/24/1934  DATE OF ADMISSION:  05/23/2011 DATE OF DISCHARGE:                              DISCHARGE SUMMARY   FINAL DISCHARGE DIAGNOSES: 1. Recurrent Clostridium difficile colitis. 2. Probable healthcare-associated pneumonia. 3. History of gastroesophageal reflux disease. 4. Anemia. 5. History of depression and anxiety.  RADIOLOGY/IMAGING: 1. Chest x-ray showed low lung volumes with right greater than left,     opacity.  Atelectasis is favored, but right lung base infection is     difficult to exclude. 2. Abdomen x-ray showed nonobstructive bowel gas pattern, no free air.  BRIEF ADMITTING HISTORY:  Please refer to H and P for more details.  [name] is a 76 year old woman who was recently discharged from the hospital after prolonged hospitalization for Clostridium difficile colitis and healthcare-associated pneumonia.  She was treated with prolonged course of Vancocin and to returned to the ER on May 24, 2011 with abdominal pain and diarrhea.  Apparently after she completed her vancomycin therapy as instructed on her previous discharge summary, she was doing better and then had recurrence of symptoms manifested by diarrhea, abdominal pain, and nausea.  HOSPITAL COURSE: 1. The patient was admitted with a diagnosis of possible recurrent     Clostridium difficile colitis.  She was initially started on oral     Flagyl; however, that was switched to Vancocin.  The patient was     hydrated with IV fluids.  Her potassium was repleted and showed     significant improvement in her symptoms.  The patient does not have     any more diarrhea for the last 2 days.  On presentation,     Clostridium difficile PCR was sent, which came back positive.  The     patient was placed on  contact isolation and Vancocin therapy was     continued. 2. Suspected UTI.  Urine culture came back negative.  So there was no     evidence of UTI. 3. Probable pneumonia versus atelectasis.  Her chest x-ray done on     June 2nd, showed findings suggestive of possible atelectasis versus     infiltrate.  The patient was started on Levaquin; however given     Clostridium difficile and no confirmed evidence of pneumonia, we     will treated with short course of antibiotics.  We will continue     Levaquin for 4 more days to complete 7 days of therapy. 4. Anemia remained stable. 5. Depression and anxiety.  The patient was continued on her home     medications. 6. History of COPD.  The patient was started on oxygen on her last     hospitalization after she had pneumonia and was hypoxemic at that     time; however, she remained stable during this hospitalization and     her O2 saturation on room air was in the higher 90s.  We also     checked her O2 saturation with ambulation, which remained  between     90% to 94%.  We will discontinue home oxygen.  The patient also is     followed by a pulmonologist at [facility] as per husband and the patient     was stated that the pulmonologist was not convinced that the     patient needs home oxygen, which I agree with at this time.  We     will discontinue home oxygen. 7. The patient was seen and examined by me today.  She is stable for     discharge to home today.  She was seen by PT/OT.  No home PT or OT     needs recommended for her.  DISCHARGE MEDICATIONS: 1. Levaquin 750 mg p.o. daily for 4 more days. 2. Vancomycin 50 mg/mL to 150 p.o. 4 times a day, take for 11 more     days to complete 14 days of therapy. 3. Advair Diskus 500/50 mcg 1 puff inhaled twice daily. 4. Calcium with zinc 1 tablet p.o. twice daily. 5. Evista out 1 tablet by mouth daily. 6. Fenofibrate 160 mg 1 tablet p.o. daily. 7. Flonase 1 spray, both nostrils daily as needed. 8. Hyomax  SR 0.375 mg half a tablet to 1 tablet p.o. twice daily as     needed. 9. Nexium 40 mg 1 capsule p.o. daily. 10.Nystatin oral suspension 5 mL orally 4 times daily. 11.Promethazine 25 mg 1 tablet p.o. q.6 h. p.r.n. 12.Prozac 20 mg 2 capsules p.o. daily. 13.Simethicone 80 mg 1 tablet p.o. 4 times daily as needed. 14.Singulair 10 mg 1 tablet p.o. daily. 15.Ventolin 1 to 2 puffs inhaled q.4 h. p.r.n. 16.Vitamin D 1000 units 1 tablet p.o. twice daily. 17.Zantac 150 mg 1 tablet p.o. daily at bedtime as needed.  DISCHARGE INSTRUCTIONS: 1. The patient to continue above medications as prescribed. 2. The patient to report any worsening of her symptoms or any new     symptoms to PCP or come back to the ED if needed. 3. Handwashing hygiene advised and contact isolation advised to the     patient and family. 4. The patient to follow with her PCP within 1 week of discharge.  CONDITION ON DISCHARGE:  Improved.          ______________________________ Baltazar Najjar, MD     SA/MEDQ  D:  05/27/2011  T:  05/27/2011  Job:  [id number]  cc:   Ace Gins, MD  Ernestina Penna, M.D. Fax: [phone]  FAOZHY QMV HQIO, MD, [facility] Fax: [phone]  Electronically Signed by Hannah Beat MD on 07/21/2011 12:36:33 PM

## 2011-08-03 ENCOUNTER — Telehealth: Payer: Self-pay | Admitting: Internal Medicine

## 2011-08-03 NOTE — Telephone Encounter (Signed)
Received alert value page from the lab this morning regarding a C. Diff PCR test on Mrs. [name].  Test was positive. Chart reviewed in echart and I see that Mrs. [name] has recently been hospitalized and treated for c diff. She reports by phone diarrhea which she discussed with Dr. Loreta Ave in clinic on Thursday.  No new symptoms.  No significant abd pain today.  No n/v. No fevers she is aware of at present. She reports completing her last round of oral vancomycin 11 days ago.  For now, I will start oral vancomycin 250 mg po QID.  I have called this in to [facility] in [place], [place].  Her husband will pick this up today.  I have written for 14 days, and will defer to Dr. Loreta Ave on possible taper given the recurrence.    She or her husband will call back if necessary or with questions. Time provided for questions and answers and they thanked me for the call.

## 2011-08-05 NOTE — Telephone Encounter (Signed)
Faxed note to Dr Loreta Ave.

## 2011-10-01 LAB — CBC
HCT: 33.2 — ABNORMAL LOW
Hemoglobin: 11.1 — ABNORMAL LOW
MCHC: 33.4
Platelets: 222
RDW: 14.2 — ABNORMAL HIGH

## 2011-10-01 LAB — CARDIAC PANEL(CRET KIN+CKTOT+MB+TROPI)
Relative Index: INVALID
Total CK: 104
Total CK: 96
Troponin I: 0.03

## 2011-10-01 LAB — DIFFERENTIAL
Basophils Absolute: 0
Basophils Relative: 1
Eosinophils Absolute: 0.1
Eosinophils Relative: 1
Monocytes Absolute: 0.5

## 2011-10-01 LAB — COMPREHENSIVE METABOLIC PANEL
ALT: 16
Albumin: 3.3 — ABNORMAL LOW
Alkaline Phosphatase: 39
Chloride: 107
Glucose, Bld: 88
Potassium: 3.8
Sodium: 140
Total Bilirubin: 0.5
Total Protein: 5.7 — ABNORMAL LOW

## 2011-10-01 LAB — I-STAT 8, (EC8 V) (CONVERTED LAB)
BUN: 11
Bicarbonate: 23.7
Glucose, Bld: 91
Hemoglobin: 11.9 — ABNORMAL LOW
TCO2: 25
pCO2, Ven: 39 — ABNORMAL LOW
pH, Ven: 7.391 — ABNORMAL HIGH

## 2011-10-01 LAB — CK TOTAL AND CKMB (NOT AT ARMC): CK, MB: 2.4

## 2011-10-01 LAB — POCT I-STAT CREATININE: Operator id: 288331

## 2011-10-01 LAB — LIPID PANEL
Cholesterol: 185
HDL: 66
LDL Cholesterol: 111 — ABNORMAL HIGH
Total CHOL/HDL Ratio: 2.8

## 2011-10-01 LAB — PROTIME-INR: Prothrombin Time: 13.6

## 2011-10-01 LAB — POCT CARDIAC MARKERS
CKMB, poc: 1.1
Myoglobin, poc: 62.2
Myoglobin, poc: 66.5
Operator id: 272551
Troponin i, poc: <0.05

## 2011-12-27 DIAGNOSIS — J4540 Moderate persistent asthma, uncomplicated: Secondary | ICD-10-CM | POA: Insufficient documentation

## 2012-12-30 ENCOUNTER — Other Ambulatory Visit: Payer: Self-pay | Admitting: Gastroenterology

## 2012-12-30 DIAGNOSIS — R110 Nausea: Secondary | ICD-10-CM

## 2013-01-06 ENCOUNTER — Encounter (HOSPITAL_COMMUNITY)
Admission: RE | Admit: 2013-01-06 | Discharge: 2013-01-06 | Disposition: A | Discharge status: Home / Self Care | Payer: Medicare Other | Source: Ambulatory Visit | Attending: Gastroenterology | Admitting: Gastroenterology

## 2013-01-06 DIAGNOSIS — R110 Nausea: Secondary | ICD-10-CM | POA: Insufficient documentation

## 2013-01-06 DIAGNOSIS — R141 Gas pain: Secondary | ICD-10-CM | POA: Insufficient documentation

## 2013-01-06 DIAGNOSIS — R142 Eructation: Secondary | ICD-10-CM | POA: Insufficient documentation

## 2013-01-06 MED ORDER — TECHNETIUM TC 99M SULFUR COLLOID
2.0000 | Freq: Once | INTRAVENOUS | Status: AC | PRN
  Administered 2013-01-06: 2 via INTRAVENOUS

## 2013-04-01 IMAGING — CT CT ABD-PELV W/ CM
2 of 5 series · 16 of 46 positions shown, 18 images · IV contrast (omnipaque)
Comparison: CT of the abdomen and pelvis performed 02/06/2006, and
abdominal ultrasound performed 09/06/2010

CLINICAL DATA: Diarrhea; diffuse skin itching.

CT ABDOMEN AND PELVIS WITH CONTRAST
TECHNIQUE: Multidetector CT imaging of the abdomen and pelvis was
performed following the standard protocol during bolus
administration of intravenous contrast.
Contrast: 100 mL of Omnipaque 300 IV contrast

[Series 2: rtn a/p with · axial · 0.65mm/px · z∈[-286,+54]mm · 13 of 78 slices shown, 15 images]
[im 5/78  soft-tissue]
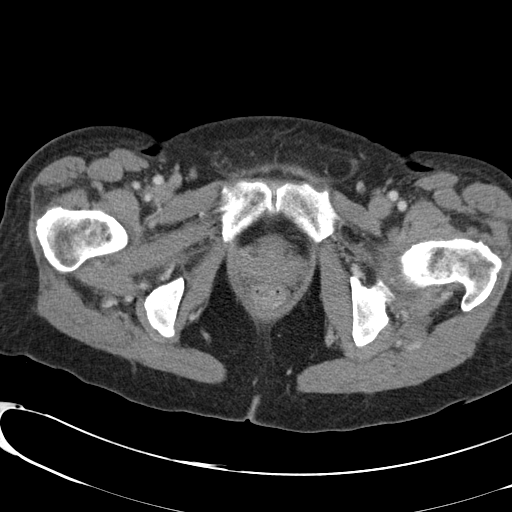
[im 5/78  bone]
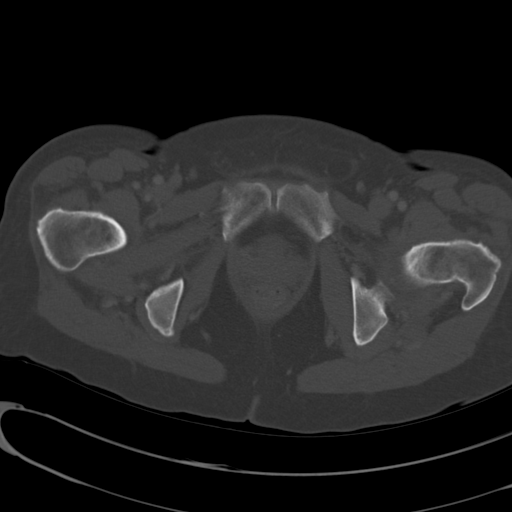
[im 9/78  soft-tissue]
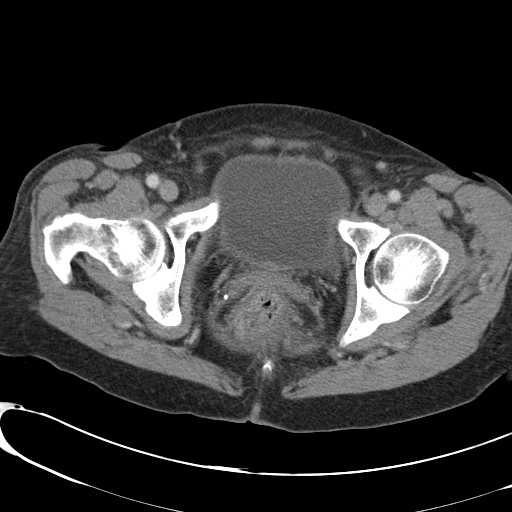
[im 18/78  soft-tissue]
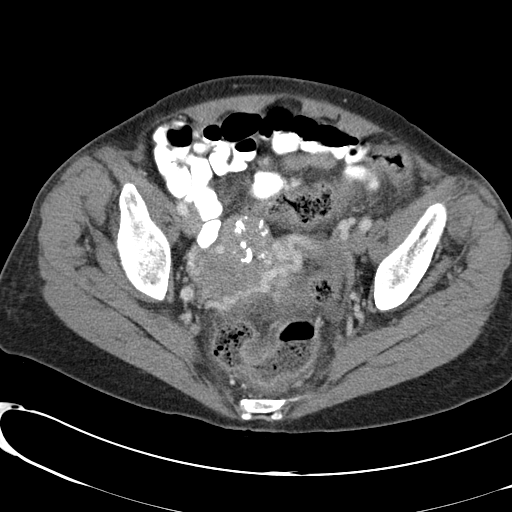
[im 22/78  soft-tissue]
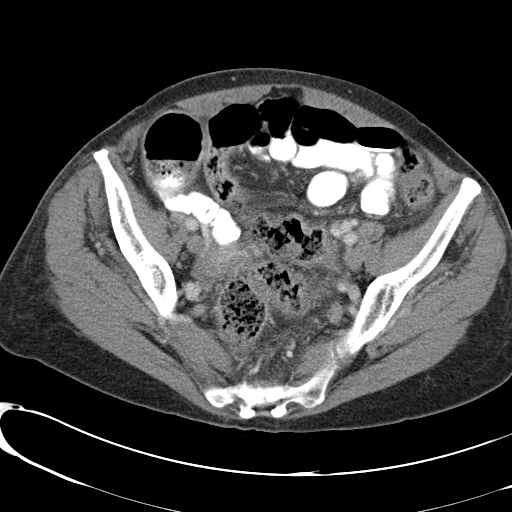
[im 26/78  soft-tissue]
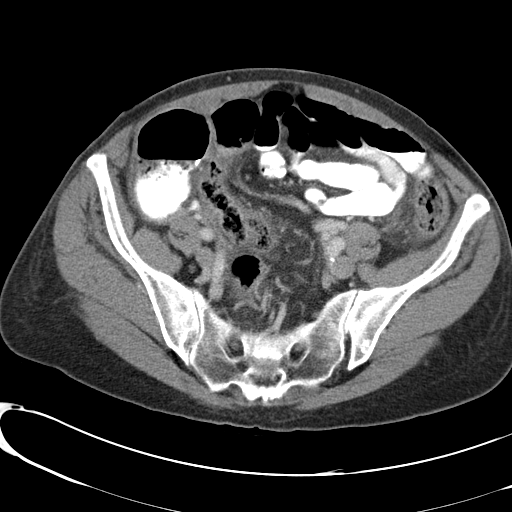
[im 35/78  soft-tissue]
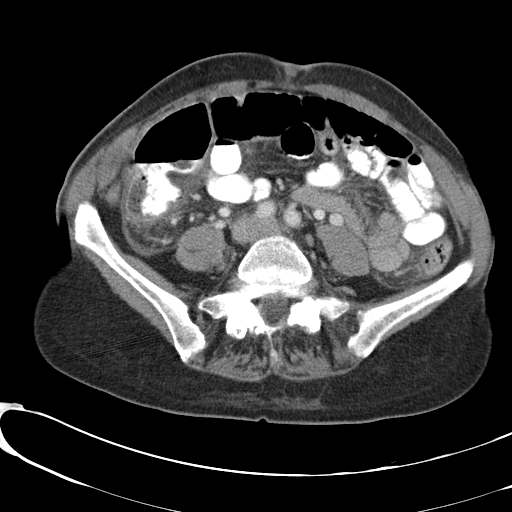
[im 39/78  soft-tissue]
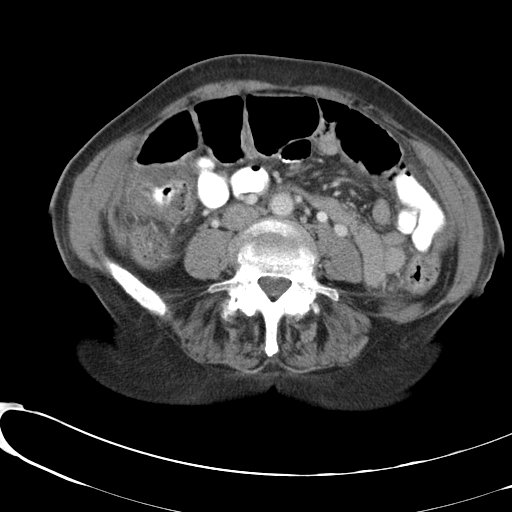
[im 43/78  soft-tissue]
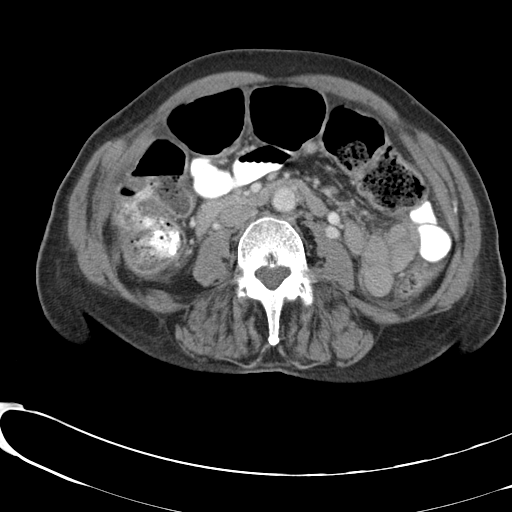
[im 52/78  soft-tissue]
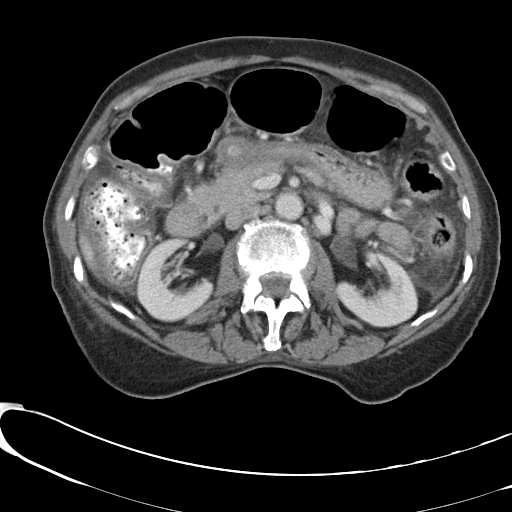
[im 52/78  bone]
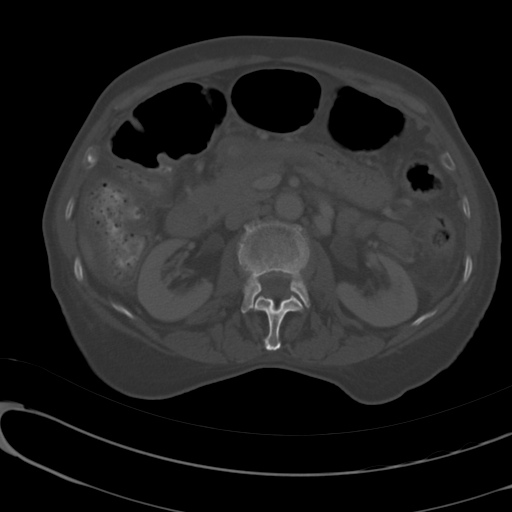
[im 56/78  soft-tissue]
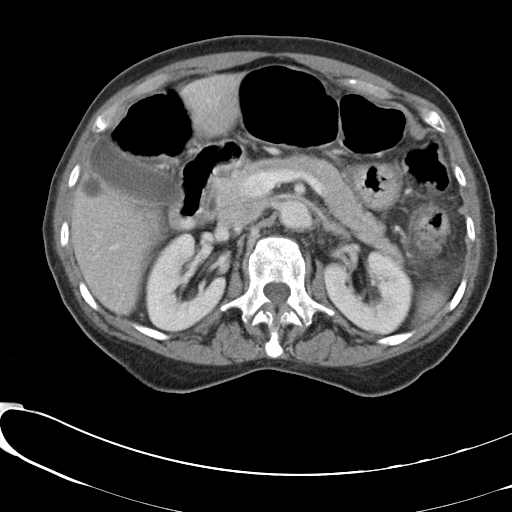
[im 60/78  soft-tissue]
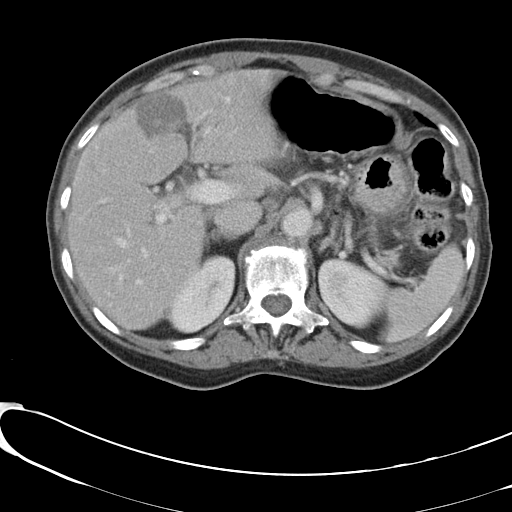
[im 69/78  soft-tissue]
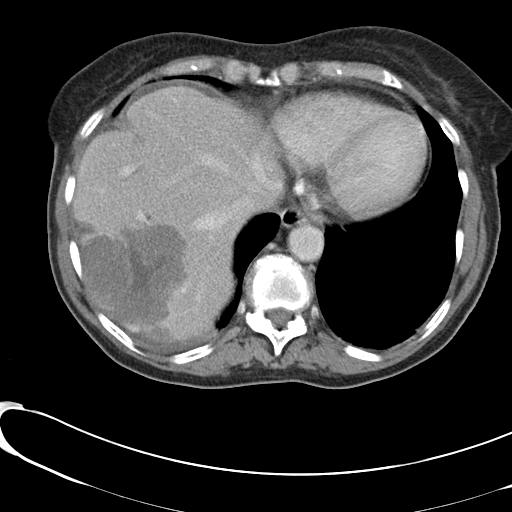
[im 73/78  soft-tissue]
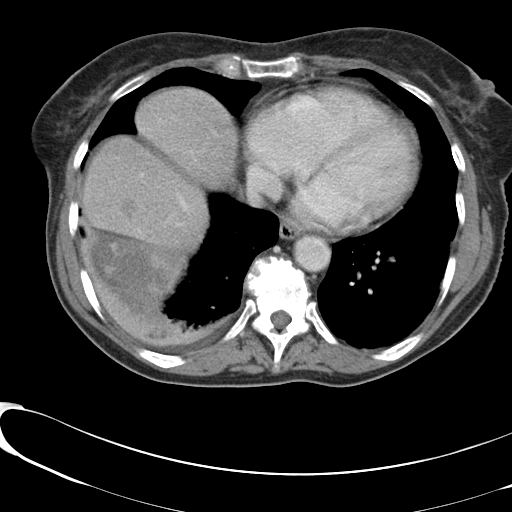

[Series 602: <mpr thick range> · coronal · 0.76mm/px · 3 of 81 slices shown]
[im 27/81  soft-tissue]
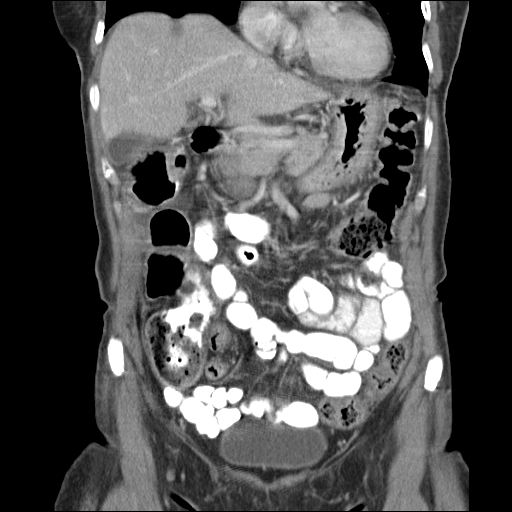
[im 36/81  soft-tissue]
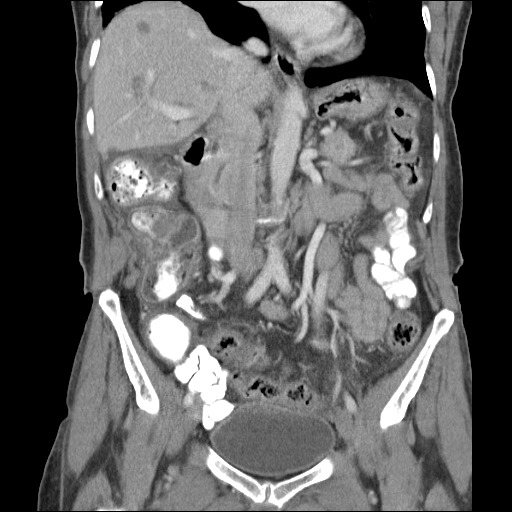
[im 45/81  soft-tissue]
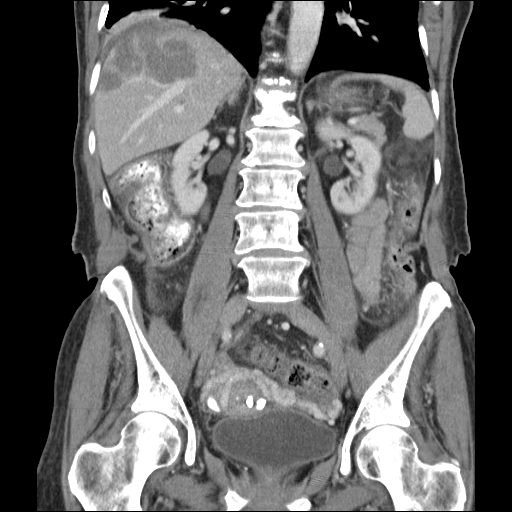

[16 of 46 positions shown; findings below may reference images not displayed]

FINDINGS: There is mild partial consolidation of the right lower
lobe, with a trace right pleural effusion.  Mild left basilar
atelectasis is noted.

Multiple hepatic hemangiomas are again noted; the largest
hemangioma measures approximately 7.3 cm, apparently mildly
decreased in size.  The liver is otherwise unremarkable in
appearance.  The spleen is unremarkable.  The gallbladder is within
normal limits; trace pericholecystic fluid likely reflects the
adjacent inflammatory process involving the colon.  The pancreas
and adrenal glands are unremarkable.

Scattered small hypodensities within both kidneys likely reflects
small cysts.  Mild stranding noted about Gerota's fascia, more
prominent on the left, reflecting the overlying inflammatory
process.  There is no evidence of hydronephrosis.  Small bilateral
extrarenal pelves are noted.  No renal or ureteral stones are seen.

The small bowel is unremarkable in appearance.  The stomach is
within normal limits.  No acute vascular abnormalities are seen.

The appendix is not seen; there is no evidence of appendicitis.
There is diffuse colonic wall thickening and extensive soft tissue
stranding surrounding the colon, with mild sparing of the
transverse colon.  This is most prominent along the sigmoid colon
and rectum, and is compatible with diffuse colitis.  Trace
associated free fluid is seen.

The bladder is moderately distended and grossly unremarkable in
appearance.  Multiple fibroids are noted within the uterus, with
associated calcification.  Prominent periuterine vessels are seen.
The ovaries are not well assessed.  No suspicious adnexal masses
are identified.  No inguinal lymphadenopathy is seen.

No acute osseous abnormalities are identified.  Facet disease is
noted at the lower lumbar spine.
IMPRESSION: 1.  Diffuse colitis involving the entirety of the colon, with
colonic wall thickening extensive soft tissue stranding.  This is
most prominent along the sigmoid colon and rectum, and may reflect
an infectious or inflammatory process.  No definite evidence for
ischemic colitis.
2.  Mild partial consolidation of the right lower lobe, with a
trace right pleural effusion.
3.  Multiple hepatic hemangiomas again noted.
4.  Scattered small bilateral renal cysts noted.
5.  Multiple calcified fibroids within the uterus; prominent
periuterine vasculature noted.

## 2013-04-23 DIAGNOSIS — G2581 Restless legs syndrome: Secondary | ICD-10-CM | POA: Insufficient documentation

## 2013-04-23 DIAGNOSIS — M810 Age-related osteoporosis without current pathological fracture: Secondary | ICD-10-CM | POA: Insufficient documentation

## 2013-05-05 DIAGNOSIS — Z8619 Personal history of other infectious and parasitic diseases: Secondary | ICD-10-CM | POA: Insufficient documentation

## 2013-05-05 DIAGNOSIS — F330 Major depressive disorder, recurrent, mild: Secondary | ICD-10-CM | POA: Insufficient documentation

## 2013-05-13 ENCOUNTER — Other Ambulatory Visit: Payer: Self-pay | Admitting: Nurse Practitioner

## 2013-05-14 NOTE — Telephone Encounter (Signed)
Last seen 11/09/12

## 2013-06-14 DIAGNOSIS — M5030 Other cervical disc degeneration, unspecified cervical region: Secondary | ICD-10-CM | POA: Insufficient documentation

## 2013-06-14 DIAGNOSIS — M4802 Spinal stenosis, cervical region: Secondary | ICD-10-CM | POA: Insufficient documentation

## 2013-06-29 ENCOUNTER — Encounter: Payer: Self-pay | Admitting: Obstetrics & Gynecology

## 2013-06-30 ENCOUNTER — Encounter: Payer: Self-pay | Admitting: Obstetrics & Gynecology

## 2013-07-01 ENCOUNTER — Ambulatory Visit (INDEPENDENT_AMBULATORY_CARE_PROVIDER_SITE_OTHER): Payer: Medicare Other | Admitting: Obstetrics & Gynecology

## 2013-07-01 ENCOUNTER — Encounter: Payer: Self-pay | Admitting: Obstetrics & Gynecology

## 2013-07-01 VITALS — BP 132/60 | HR 60 | Resp 16 | Ht 62.25 in | Wt 137.4 lb

## 2013-07-01 DIAGNOSIS — Z01419 Encounter for gynecological examination (general) (routine) without abnormal findings: Secondary | ICD-10-CM

## 2013-07-01 DIAGNOSIS — Z124 Encounter for screening for malignant neoplasm of cervix: Secondary | ICD-10-CM

## 2013-07-01 DIAGNOSIS — L570 Actinic keratosis: Secondary | ICD-10-CM

## 2013-07-01 DIAGNOSIS — I83893 Varicose veins of bilateral lower extremities with other complications: Secondary | ICD-10-CM

## 2013-07-01 MED ORDER — MIRABEGRON ER 25 MG PO TB24: 25.0000 mg | ORAL_TABLET | Freq: Every day | ORAL | Status: DC

## 2013-07-01 MED ORDER — FLUOXETINE HCL 20 MG PO CAPS: 20.0000 mg | ORAL_CAPSULE | Freq: Every day | ORAL | Status: DC

## 2013-07-01 NOTE — Patient Instructions (Signed)

## 2013-07-01 NOTE — Progress Notes (Signed)
78 y.o. [id number] MarriedCaucasianF here for annual exam.  Reports having some issues with jittery feeling.  Saw pulmonologist at [facility].  Was taken off of steroid inhalers and symptoms are better.  But she is having a little more issues with mucous off of the steroid inhalers.    Reports legs/veins are bothering her more this year.  Would like to see a "vein" specialist.  She also would like a derm referral.  Wants me to look at a few places on her chest.   Lastly, she is frustrated with both urinary and fecal incontinence.  Fecal incontinence is mostly with diarrhea.  Working with Dr. Loreta Ave on ways to improve stool quality.  Urinary incontinence is with coughing at times but she leaks at night when she is asleep and wakes up at least once to go to the bathroom.    Patient's last menstrual period was 12/23/1989.          Sexually active: no  The current method of family planning is tubal ligation.    Exercising: yes  walking, treadmill, and bicycle Smoker:  no  Health Maintenance: Pap:  06/04/12 WNL History of abnormal Pap:  yes MMG:  2012-normal, knows due Colonoscopy:  2013 repeat in 7 years, polyps, Dr. Loreta Ave BMD:   2012, -0.7/-1.2 TDaP:  2008 Pneumovax: 2014 Screening Labs: PCP, Hb today: PCP, Urine today: PCP   reports that she has never smoked. She has never used smokeless tobacco. She reports that  drinks alcohol. She reports that she does not use illicit drugs.  Past Medical History  Diagnosis Date  . GERD (gastroesophageal reflux disease)   . Asthma   . Osteopenia   . MVP (mitral valve prolapse)   . Iron deficiency anemia 6/13  . Incontinence   . Depression   . IBS (irritable bowel syndrome)   . Neck pain     in process of evaluating vertebrae 5, 6, 6/7    Past Surgical History  Procedure Laterality Date  . Breast biopsy    . Tubal ligation  1992  . Hemorrhoid surgery      Current Outpatient Prescriptions  Medication Sig Dispense Refill  . Alum Hydroxide-Mag Carbonate  (GAVISCON EXTRA STRENGTH PO) Take by mouth.      Marland Kitchen CALCIUM PO Take by mouth.      . cetirizine (ZYRTEC) 10 MG tablet Take 10 mg by mouth as needed for allergies.      . Cholecalciferol (VITAMIN D) 2000 UNITS CAPS Take by mouth.      . esomeprazole (NEXIUM) 40 MG capsule Take 40 mg by mouth daily before breakfast.      . FLUoxetine (PROZAC) 20 MG capsule       . Magnesium 400 MG CAPS Take by mouth.      . montelukast (SINGULAIR) 10 MG tablet Take 10 mg by mouth at bedtime.      . Probiotic Product (PROBIOTIC PO) Take by mouth.      . raloxifene (EVISTA) 60 MG tablet Take 60 mg by mouth daily.      . ALBUTEROL IN Inhale into the lungs as needed.      Marland Kitchen FERROUS SULFATE PO Take by mouth 3 (three) times daily.      . Flaxseed, Linseed, (FLAX SEED OIL PO) Take by mouth.      . Ipratropium-Albuterol (COMBIVENT IN) Inhale into the lungs. As needed      . Mometasone Furo-Formoterol Fum (DULERA IN) Inhale into the lungs.      Marland Kitchen  OMEGA-3 KRILL OIL PO Take by mouth.       No current facility-administered medications for this visit.    Family History  Problem Relation Age of Onset  . Spina bifida Son   . Cancer Paternal Uncle     ? type  . Hypertension Brother   . Heart disease Brother     bypass surgery  . Thyroid disease Daughter   . Thyroid disease Sister     ROS:  Pertinent items are noted in HPI.  Otherwise, a comprehensive ROS was negative.  Exam:   BP 132/60  Pulse 60  Resp 16  Ht 5' 2.25" (1.581 m)  Wt 137 lb 6.4 oz (62.324 kg)  BMI 24.93 kg/m2  LMP 12/23/1989  Weight change: =3lbs    Height: 5' 2.25" (158.1 cm)  Ht Readings from Last 3 Encounters:  07/01/13 5' 2.25" (1.581 m)    General appearance: alert, cooperative and appears stated age Head: Normocephalic, without obvious abnormality, atraumatic Neck: no adenopathy, supple, symmetrical, trachea midline and thyroid normal to inspection and palpation Lungs: clear to auscultation bilaterally Breasts: normal appearance,  no masses or tenderness Heart: regular rate and rhythm Abdomen: soft, non-tender; bowel sounds normal; no masses,  no organomegaly Extremities: extremities normal, atraumatic, no cyanosis or edema Skin: Skin color, texture, turgor normal. Multiple actinic or seborrheic keratosis on chest wall Lymph nodes: Cervical, supraclavicular, and axillary nodes normal. No abnormal inguinal nodes palpated Neurologic: Grossly normal   Pelvic: External genitalia:  no lesions              Urethra:  normal appearing urethra with no masses, tenderness or lesions              Bartholins and Skenes: normal                 Vagina: normal appearing vagina with normal color and discharge, no lesions              Cervix: no lesions              Pap taken: no Bimanual Exam:  Uterus:  normal size, contour, position, consistency, mobility, non-tender              Adnexa: normal adnexa and no mass, fullness, tenderness               Rectovaginal: Confirms               Anus:  normal sphincter tone, no lesions  A:  Well Woman with normal exam Osteopenia PMP, No HRT Asthma, followed at [facility] Fecal/urinary incontinence--declines urology referal H/O depression--stable  P:   Mammogram due.  Will schedule for patient before she leaves. pap smear last year.  No Pap today. Stop Evista.  Repeat BMD two years.   Trial of myrbetriq 25mg  daily.  Samples x 6 weeks given.  F/U 6 weeks Referral to dermatology Referral to [facility] return annually or prn  An After Visit Summary was printed and given to the patient.

## 2013-07-07 ENCOUNTER — Other Ambulatory Visit: Payer: Self-pay | Admitting: Obstetrics & Gynecology

## 2013-07-07 DIAGNOSIS — Z1231 Encounter for screening mammogram for malignant neoplasm of breast: Secondary | ICD-10-CM

## 2013-07-12 ENCOUNTER — Telehealth: Payer: Self-pay | Admitting: *Deleted

## 2013-07-12 DIAGNOSIS — L570 Actinic keratosis: Secondary | ICD-10-CM

## 2013-07-12 NOTE — Telephone Encounter (Signed)
Order to derm referral placed.

## 2013-07-12 NOTE — Telephone Encounter (Signed)
Patient notified that will make appointment to [facility] [place]. Patient request a earl afternoon appt.  Referral form to be faxed to [facility] after Dr. Hyacinth Meeker sign.

## 2013-07-12 NOTE — Telephone Encounter (Signed)
Patient has scheduled appointment with Dermatology Specialist with Dr. Sharyn Lull on Oct 2nd @ 10:40am. Left message of this on CB#VM as patient had requested.Marland Kitchen

## 2013-07-15 ENCOUNTER — Other Ambulatory Visit: Payer: Self-pay | Admitting: *Deleted

## 2013-07-15 DIAGNOSIS — I83893 Varicose veins of bilateral lower extremities with other complications: Secondary | ICD-10-CM

## 2013-07-20 ENCOUNTER — Telehealth: Payer: Self-pay | Admitting: *Deleted

## 2013-07-20 NOTE — Telephone Encounter (Signed)
Left message on CB# as patient requested of appointment at [facility] is Sept. 22, 2014 @ 1:00pm.

## 2013-08-05 ENCOUNTER — Ambulatory Visit
Admission: RE | Admit: 2013-08-05 | Discharge: 2013-08-05 | Disposition: A | Discharge status: Home / Self Care | Payer: Medicare Other | Source: Ambulatory Visit | Attending: Obstetrics & Gynecology | Admitting: Obstetrics & Gynecology

## 2013-08-05 DIAGNOSIS — Z1231 Encounter for screening mammogram for malignant neoplasm of breast: Secondary | ICD-10-CM

## 2013-08-10 ENCOUNTER — Telehealth: Payer: Self-pay | Admitting: Obstetrics & Gynecology

## 2013-08-10 NOTE — Telephone Encounter (Signed)
Patient has appointment on the 21 with Dr. Hyacinth Meeker . She also has to go to an appointment with her husband on that same day to the [place] [facility]. . She needs to see Dr. Hyacinth Meeker about her bladder issues . I can't find an appointment to accommodate her . She really needs and appointment to be seen.

## 2013-08-10 NOTE — Telephone Encounter (Signed)
Patient request to reschedule appt. On 08/12/2013 with  Dr.Miller. Per patient she is OK seeing another provider for her concern of Rx Myrbtriq she has been on . Stated that Rx worked good but not totally helping with her incontinence. Appointment given for Friday, August 22nd with Dr. Edward Jolly. Patient is agreeable to this and complimentary of how our office accomodates patients.

## 2013-08-10 NOTE — Telephone Encounter (Signed)
LMTCB  aa 

## 2013-08-12 ENCOUNTER — Ambulatory Visit: Payer: Medicare Other | Admitting: Obstetrics & Gynecology

## 2013-08-13 ENCOUNTER — Encounter: Payer: Self-pay | Admitting: Obstetrics and Gynecology

## 2013-08-13 ENCOUNTER — Ambulatory Visit (INDEPENDENT_AMBULATORY_CARE_PROVIDER_SITE_OTHER): Payer: Medicare Other | Admitting: Obstetrics and Gynecology

## 2013-08-13 VITALS — BP 140/78 | HR 70 | Ht 62.25 in | Wt 139.0 lb

## 2013-08-13 DIAGNOSIS — N3946 Mixed incontinence: Secondary | ICD-10-CM

## 2013-08-13 LAB — POCT URINALYSIS DIPSTICK
Blood, UA: NEGATIVE
Protein, UA: NEGATIVE
Urobilinogen, UA: NEGATIVE

## 2013-08-13 NOTE — Progress Notes (Signed)
Patient ID: [name], female   DOB: 14-Nov-1935, 78 y.o.   MRN: [id number]  Subjective  Patient her for a recheck or urinary incontinence following initiation of Myrbetriq 25 mg six weeks prior.  Patient is having urinary incontinence for a long time. Increasing in severity.  Has asthma and chronic cough.   Patient has stopped some of her medication to control this. Patient has a lot of allergies which agrevate her symptoms. Does sinus rinses and oral respiratory therapy.   Allergies somewhat better with the Myrbetric.  Leaks mostly with coughing and laughing.  Leaks with lifting, but does no do this often due to spinal problems.   She is currently in physical therapy for neck issues.  Daytime frequency - every 2 hours, increases with soda use. Nighttime frequency - once a night. Having enuresis.  Unclear if it is related to activity and rolling over at night.   Can leak related to urgency sometimes.  States perhaps some difficulty with voiding.  Drinks a lot of water. Drinks a lot of coffee.  Noting some nausea.    No prior bladder surgery or procedures. History of tubal ligation.   Has had some physical therapy for there bladder several years back but did not continue for long due to social issues of caring for her mother and having a busy life.  Had some fecal incontinence for a period of time but now is having constipation now. Issues started with C. Difficile.  States her husband is doing cardiac rehab and so she is very busy with his schedule.  Objective  Blood pressures 140/78 - today 132/60 - 07/01/13 116/60 - one year ago.  Urine dip - negative  Assessment  Asthma and chronic bronchitis. Likely mixed incontinence. I suspect that the patient's enuresis may be more due to stress incontinence than overactive bladder.  Physically rolling over and coughing at night may be more of the source of her incontinence.  On Myrbetriq with limited response so  far. Slight elevation in blood pressure today.  Plan  After a long discussion today, patient will continue on Myrbetriq 25 mg for at least another 4 weeks.  4 boxes of samples (7 tabs each) given with Lot number [id number], Expiration 05/2015. She will check her blood pressure at home once a week and call if it is >= 140/90. Signs and symptoms of elevated blood pressure reviewed with patient but she also understands she may have no symptoms. Patient is declining pelvic floor physical therapy. I discussed bladder irritants. I discussed midurethral sling risks and benefits. Handout from [employer] on urinary incontinence to patient.  Follow up in 4 weeks with Dr. Edward Jolly or Dr. Hyacinth Meeker. I would consider urodynamics for this patient in the future to define her incontinence further.

## 2013-08-13 NOTE — Patient Instructions (Signed)
Please check your blood pressure once a week at home.  Call if it is 140/90 or higher.  Otherwise, keep your appointment with [place] in one month.  Do your Kegel and bladder strengthening exercises also!  Please try to reduce your caffeine use.

## 2013-08-14 ENCOUNTER — Encounter: Payer: Self-pay | Admitting: Obstetrics and Gynecology

## 2013-08-16 ENCOUNTER — Telehealth: Payer: Self-pay | Admitting: Obstetrics & Gynecology

## 2013-08-16 NOTE — Telephone Encounter (Signed)
Patient was given samples of Myrbetriq  25 mgs . By Dr. Hyacinth Meeker she has taken them for 6 weeks. She came back in at that time she saw Dr. Edward Jolly. She gave her some more samples they were 50 mgs. She said her blood pressure is up some . Want to know if she needs to keep taking or what does she need to do ? Instructions say not to cut,crush or chew.

## 2013-08-16 NOTE — Telephone Encounter (Signed)
Patient concerned about samples of 50 mg Myrbetriq and elevated BP. Does not want to fill RX because if BP stays high she will want to discontinue. Was told to check BP every week till follow up. Has not checked it since she was here last week.  Advise that samples of Myrbetriq 25 mg 4 boxes Lot #[id number]  Exp 6-15.  Dr Hyacinth Meeker actually spoke to patient and discussed that the 50 mg dose may have less effect on BP and she should try that first.  Also may provide overall better symptom control.  Will try the 50 mg and if BP is worse, she can always drop back down to 25 mg.  Has follow up appt in 4 week. With Dr Hyacinth Meeker.

## 2013-08-18 ENCOUNTER — Encounter: Payer: Self-pay | Admitting: Vascular Surgery

## 2013-08-19 ENCOUNTER — Ambulatory Visit (INDEPENDENT_AMBULATORY_CARE_PROVIDER_SITE_OTHER): Payer: Medicare Other | Admitting: Vascular Surgery

## 2013-08-19 ENCOUNTER — Encounter: Payer: Self-pay | Admitting: Vascular Surgery

## 2013-08-19 VITALS — BP 155/69 | HR 61 | Resp 18 | Ht 63.0 in | Wt 137.8 lb

## 2013-08-19 DIAGNOSIS — I83893 Varicose veins of bilateral lower extremities with other complications: Secondary | ICD-10-CM | POA: Insufficient documentation

## 2013-08-19 DIAGNOSIS — M79609 Pain in unspecified limb: Secondary | ICD-10-CM | POA: Insufficient documentation

## 2013-08-19 NOTE — Progress Notes (Signed)
[facility] [place]   Patient name: [name] MRN: [id number] DOB: 10-07-1935 Sex: female   Referred by: Hyacinth Meeker  Reason for referral:  Chief Complaint  Patient presents with  . New Evaluation    c/o bilateral leg pain and swelling (L>R), worse with prolonged standing     . Varicose Veins    HISTORY OF PRESENT ILLNESS: Patient has today for evaluation of venous hypertension and painful varicosities more so on her left leg than her right. She reports is been present for many years and become more progressive. She has noted more pain specifically over the varicosities in her medial left calf and also aching sensation with prolonged standing. He does not have any history of DVT and no history of bleeding from her varicosities. She does have marked telangiectasia or both lower extremities. She does elevate her legs when possible but is not worn graduated compression garments.  Past Medical History  Diagnosis Date  . GERD (gastroesophageal reflux disease)   . Asthma   . Osteopenia   . MVP (mitral valve prolapse)   . Iron deficiency anemia 6/13  . Incontinence   . Depression   . IBS (irritable bowel syndrome)   . Neck pain     currently being evaluated 5, 6, 6/7 vertebrae  . Bronchiectasis     Past Surgical History  Procedure Laterality Date  . Breast biopsy    . Tubal ligation  1992  . Hemorrhoid surgery      History   Social History  . Marital Status: Married    Spouse Name: N/A    Number of Children: N/A  . Years of Education: N/A   Occupational History  . Not on file.   Social History Main Topics  . Smoking status: Never Smoker   . Smokeless tobacco: Never Used  . Alcohol Use: Yes     Comment: maybe one x a year-wine  . Drug Use: No  . Sexual Activity: No     Comment: BTL   Other Topics Concern  . Not on file   Social History Narrative  . No narrative on file    Family History  Problem Relation Age of Onset  . Spina bifida  Son   . Cancer Paternal Uncle     ? type  . Hypertension Brother   . Heart disease Brother     bypass surgery  . Thyroid disease Daughter   . Thyroid disease Sister     Allergies as of 08/19/2013 - Review Complete 08/19/2013  Allergen Reaction Noted  . Penicillins Other (See Comments)     Current Outpatient Prescriptions on File Prior to Visit  Medication Sig Dispense Refill  . ALBUTEROL IN Inhale into the lungs as needed.      . Alum Hydroxide-Mag Carbonate (GAVISCON EXTRA STRENGTH PO) Take by mouth.      Marland Kitchen CALCIUM PO Take by mouth.      . cetirizine (ZYRTEC) 10 MG tablet Take 10 mg by mouth as needed for allergies.      . Cholecalciferol (VITAMIN D) 2000 UNITS CAPS Take by mouth.      . esomeprazole (NEXIUM) 40 MG capsule Take 40 mg by mouth daily before breakfast.      . Flaxseed, Linseed, (FLAX SEED OIL PO) Take by mouth.      Marland Kitchen FLUoxetine (PROZAC) 20 MG capsule Take 1 capsule (20 mg total) by mouth daily.  90 capsule  4  . Magnesium 400 MG CAPS  Take by mouth.      . mirabegron ER (MYRBETRIQ) 25 MG TB24 Take 1 tablet (25 mg total) by mouth daily. One po qd  30 tablet  12  . montelukast (SINGULAIR) 10 MG tablet Take 10 mg by mouth at bedtime.      . OMEGA-3 KRILL OIL PO Take by mouth.      . Probiotic Product (PROBIOTIC PO) Take by mouth.      Di Kindle SULFATE PO Take by mouth 3 (three) times daily.      . Ipratropium-Albuterol (COMBIVENT IN) Inhale into the lungs. As needed      . Mometasone Furo-Formoterol Fum (DULERA IN) Inhale into the lungs.      . raloxifene (EVISTA) 60 MG tablet Take 60 mg by mouth daily.       No current facility-administered medications on file prior to visit.     REVIEW OF SYSTEMS:  Positives indicated with an "X"  CARDIOVASCULAR:  [ ]  chest pain   [ ]  chest pressure   [ ]  palpitations   [ ]  orthopnea   [ ]  dyspnea on exertion   [ ]  claudication   [ ]  rest pain   [ ]  DVT   [ ]  phlebitis PULMONARY:   [ ]  productive cough   [ ]  asthma   [ ]   wheezing NEUROLOGIC:   [x ] weakness  [ x] paresthesias  [ ]  aphasia  [ ]  amaurosis  [x ] dizziness HEMATOLOGIC:   [ ]  bleeding problems   [ ]  clotting disorders MUSCULOSKELETAL:  [ ]  joint pain   [ ]  joint swelling GASTROINTESTINAL: [ ]   blood in stool  [ ]   hematemesis GENITOURINARY:  [ ]   dysuria  [ ]   hematuria PSYCHIATRIC:  [ ]  history of major depression INTEGUMENTARY:  [ ]  rashes  [ ]  ulcers CONSTITUTIONAL:  [ ]  fever   [ ]  chills  PHYSICAL EXAMINATION:  General: The patient is a well-nourished female, in no acute distress. Vital signs are BP 155/69  Pulse 61  Resp 18  Ht 5\' 3"  (1.6 m)  Wt 137 lb 12.8 oz (62.506 kg)  BMI 24.42 kg/m2  LMP 12/23/1989 Pulmonary: There is a good air exchange   Musculoskeletal: There are no major deformities.  There is no significant extremity pain. Neurologic: No focal weakness or paresthesias are detected, Skin: There are no ulcer or rashes noted. Psychiatric: The patient has normal affect. Cardiovascular: 2+ dorsalis pedis pulses bilaterally She has a marked telangiectasia or both legs and extensive varicosities in this particular the medial aspect of her left calf.   Vascular Lab Studies:  She did not have a formal venous duplex today. I did image these veins with SonoSite ultrasound this does show reflux with large great saphenous vein on the left this is more so than on the right.  Impression and Plan:  Pain associated with venous hypertension. Have recommended thigh-high graduated compression garments for symptom relief. She also understands the importance of elevation and when possible and also ibuprofen for discomfort. We will see her again in 3 months with a formal venous duplex at that time of both her right and left leg to determine treatment option should she fail conservative treatment.    Dorion Petillo [facility] [place] Office: [phone]

## 2013-08-20 NOTE — Addendum Note (Signed)
Addended by: Adria Dill L on: 08/20/2013 12:03 PM   Modules accepted: Orders

## 2013-09-13 ENCOUNTER — Encounter: Payer: Self-pay | Admitting: Obstetrics & Gynecology

## 2013-09-13 ENCOUNTER — Ambulatory Visit (INDEPENDENT_AMBULATORY_CARE_PROVIDER_SITE_OTHER): Payer: Medicare Other | Admitting: Obstetrics & Gynecology

## 2013-09-13 ENCOUNTER — Encounter: Payer: Medicare Other | Admitting: Surgery

## 2013-09-13 VITALS — BP 128/62 | HR 68 | Resp 16 | Ht 63.0 in | Wt 138.0 lb

## 2013-09-13 DIAGNOSIS — N3946 Mixed incontinence: Secondary | ICD-10-CM

## 2013-09-13 MED ORDER — MIRABEGRON ER 50 MG PO TB24: 50.0000 mg | ORAL_TABLET | Freq: Every day | ORAL | Status: DC

## 2013-09-13 NOTE — Progress Notes (Signed)
78 y.o. Married Caucasian female [id number] here for follow up of [facility].  Started Myrbetriq 25mg  but had some BP issues and the dosage was changed to 50mg .  Blood pressure is much better on the 50mg  dosage.  Symptoms are much better but not gone.  She is having less urgency symptoms and she feels like she has better control getting to the bathroom.  She does leak some with coughing.  Occasionally, she is still leaking with laughing.  Does leak some with exercise.  Getting up only once a night.  Was 2-3 times before, so that is an improvement.    O: Healthy WD,WN female Affect: normal  A: Mixed urinary incontinence, improved with Myrbetriq  P: Refer to Dr. Logan Bores at [facility]. Continue Myrbetriq 50mg  daily

## 2013-09-13 NOTE — Patient Instructions (Signed)
We will call with the appointment for Dr. Logan Bores.

## 2013-09-22 ENCOUNTER — Telehealth: Payer: Self-pay | Admitting: Obstetrics & Gynecology

## 2013-09-22 ENCOUNTER — Telehealth: Payer: Self-pay | Admitting: Emergency Medicine

## 2013-09-22 NOTE — Telephone Encounter (Signed)
Prior authorization has been completed and approved for Myrbetriq and reprocessed with coverage per [facility] in [place].  Coverage approved from 08/21/13-ongoing.  Patient sleeping when I called, husband Dorene Sorrow (emergency contact) given message. Verbalized understanding.

## 2013-09-22 NOTE — Telephone Encounter (Signed)
Spoke with patient. She states she does not need fill right now because she has samples. Advised she can wait to pick up rx when she is ready.

## 2013-10-19 DIAGNOSIS — N3941 Urge incontinence: Secondary | ICD-10-CM | POA: Insufficient documentation

## 2013-11-22 ENCOUNTER — Encounter: Payer: Self-pay | Admitting: Vascular Surgery

## 2013-11-23 ENCOUNTER — Encounter (HOSPITAL_COMMUNITY): Payer: Medicare Other

## 2013-11-23 ENCOUNTER — Encounter (INDEPENDENT_AMBULATORY_CARE_PROVIDER_SITE_OTHER): Payer: Self-pay

## 2013-11-23 ENCOUNTER — Encounter: Payer: Self-pay | Admitting: Vascular Surgery

## 2013-11-23 ENCOUNTER — Ambulatory Visit (INDEPENDENT_AMBULATORY_CARE_PROVIDER_SITE_OTHER): Payer: Medicare Other | Admitting: Vascular Surgery

## 2013-11-23 ENCOUNTER — Ambulatory Visit (HOSPITAL_COMMUNITY)
Admission: RE | Admit: 2013-11-23 | Discharge: 2013-11-23 | Disposition: A | Discharge status: Home / Self Care | Payer: Medicare Other | Source: Ambulatory Visit | Attending: Vascular Surgery | Admitting: Vascular Surgery

## 2013-11-23 VITALS — BP 111/59 | HR 63 | Resp 16 | Ht 63.0 in | Wt 135.8 lb

## 2013-11-23 DIAGNOSIS — I83893 Varicose veins of bilateral lower extremities with other complications: Secondary | ICD-10-CM | POA: Insufficient documentation

## 2013-11-23 DIAGNOSIS — M79609 Pain in unspecified limb: Secondary | ICD-10-CM | POA: Insufficient documentation

## 2013-11-23 NOTE — Progress Notes (Signed)
Problems with Activities of Daily Living Secondary to Leg Pain  1. [name] states that any activities that require prolonged standing (cooking , cleaning, shopping) are very difficult due to leg pain.    2. [name] states that any activities that require prolonged sitting (traveling, etc.) are very difficult due to leg pain.     Failure of  Conservative Therapy:  1. Worn 20-30 mm Hg thigh high compression hose >3 months with no relief of symptoms.  2. Frequently elevates legs-no relief of symptoms  3. Taken Ibuprofen 600 Mg TID with no relief of symptoms.  The patient tends to have discomfort in her left leg despite conservative treatment with elevation and thigh graduated compression garments. She has been able to wear compression reports this has not made any difference. She does have mild symptoms in her right leg. She reports pain over the medial calf with prolonged standing.  Her duplex today we'll does reveal reflux throughout her great saphenous vein on the left sitting in these varicosities. It is dilated as well. No significant reflux as noted in her right saphenous vein.  Impression and plan failed conservative treatment of symptomatic venous hypertension in her left leg. I have recommended laser ablation of her left great saphenous vein for relief of symptoms. Also recommended sclerotherapy to the plexus of the reticular veins throughout her thigh for relief from this area as well. I described the procedure which is an outpatient procedure under local anesthesia. Also explained the slight risk for DVT. Patient understands wish to proceed as soon as possible

## 2013-11-30 ENCOUNTER — Other Ambulatory Visit: Payer: Self-pay | Admitting: *Deleted

## 2013-11-30 DIAGNOSIS — M79609 Pain in unspecified limb: Secondary | ICD-10-CM

## 2013-11-30 DIAGNOSIS — I83893 Varicose veins of bilateral lower extremities with other complications: Secondary | ICD-10-CM

## 2013-12-01 ENCOUNTER — Telehealth: Payer: Self-pay | Admitting: Vascular Surgery

## 2013-12-01 NOTE — Telephone Encounter (Signed)
Message copied by Fredrich Birks on Wed Dec 01, 2013  9:59 AM ------      Message from: Domenic Moras D      Created: Tue Nov 30, 2013  5:01 PM      Regarding: scheduling       Please schedule [name] for post LA duplex (left leg, order in EPIC) and VV FU on 12-14-2013.   Thanks! ------

## 2013-12-07 ENCOUNTER — Encounter: Payer: Self-pay | Admitting: Vascular Surgery

## 2013-12-08 ENCOUNTER — Encounter: Payer: Self-pay | Admitting: Vascular Surgery

## 2013-12-08 ENCOUNTER — Ambulatory Visit (INDEPENDENT_AMBULATORY_CARE_PROVIDER_SITE_OTHER): Payer: Medicare Other | Admitting: Vascular Surgery

## 2013-12-08 VITALS — BP 147/75 | HR 69 | Resp 16 | Ht 63.0 in | Wt 132.0 lb

## 2013-12-08 DIAGNOSIS — I83893 Varicose veins of bilateral lower extremities with other complications: Secondary | ICD-10-CM

## 2013-12-08 HISTORY — PX: ENDOVENOUS ABLATION SAPHENOUS VEIN W/ LASER: SUR449

## 2013-12-08 NOTE — Progress Notes (Signed)
   Laser Ablation Procedure      Date: 12/08/2013    Barton Dubois DOB:[birth year]  Consent signed: Yes  Surgeon:T.F. Ashli Selders  Procedure: Laser Ablation: left Greater Saphenous Vein  BP 147/75  Pulse 69  Resp 16  Ht 5\' 3"  (1.6 m)  Wt 132 lb (59.875 kg)  BMI 23.39 kg/m2  LMP 12/23/1989  Start time: 12:45PM   End time: 1:50PM  Tumescent Anesthesia: 425 cc 0.9% NaCl with 50 cc Lidocaine HCL with 1% Epi and 15 cc 8.4% NaHCO3  Local Anesthesia: 3 cc Lidocaine HCL and NaHCO3 (ratio 2:1)  Continuous Mode: 15 Watts Total Energy 2396 Joules Total Time2:39   Sclerotherapy: 0.3 %Sotradecol. Patient received a total of 3 cc   Patient tolerated procedure well: Yes    Description of Procedure:  After marking the course of the saphenous vein and the secondary varicosities in the standing position, the patient was placed on the operating table in the supine position, and the left leg was prepped and draped in sterile fashion. Local anesthetic was administered, and under ultrasound guidance the saphenous vein was accessed with a micro needle and guide wire; then the micro puncture sheath was placed. A guide wire was inserted to the saphenofemoral junction, followed by a 5 french sheath.  The position of the sheath and then the laser fiber below the junction was confirmed using the ultrasound and visualization of the aiming beam.  Tumescent anesthesia was administered along the course of the saphenous vein using ultrasound guidance. Protective laser glasses were placed on the patient, and the laser was fired at at 15 watt continuous mode.  For a total of 2396 joules.  A steri strip was applied to the puncture site.   Sclerotherapy was performed to 5  vessels using 3  cc .3% Sotradecol foam via a 30g needle.  ABD pads and thigh high compression stockings were applied.  Ace wrap bandages were applied over the phlebectomy sites and at the top of the saphenofemoral junction.  Blood loss was less  than 15 cc.  The patient ambulated out of the operating room having tolerated the procedure well.

## 2013-12-09 ENCOUNTER — Telehealth: Payer: Self-pay | Admitting: *Deleted

## 2013-12-09 NOTE — Telephone Encounter (Signed)
    12/09/2013  Time: 4:29 PM   Patient Name: [name]  Patient of: T.F. Early  Procedure:Laser Ablation left greater saphenous vein and sclerotherapy left leg  12-08-2013  Reached patient at home and checked  Her status  Yes    Comments/Actions Taken: [name] she has no problems with leg pain or swelling.  Reviewed post procedural instructions with her and reminded her of post ablation duplex and VV FU with Dr. Arbie Cookey on 12-14-2013.      @SIGNATURE @

## 2013-12-13 ENCOUNTER — Encounter: Payer: Self-pay | Admitting: Vascular Surgery

## 2013-12-14 ENCOUNTER — Ambulatory Visit (INDEPENDENT_AMBULATORY_CARE_PROVIDER_SITE_OTHER): Payer: Medicare Other | Admitting: Vascular Surgery

## 2013-12-14 ENCOUNTER — Ambulatory Visit (HOSPITAL_COMMUNITY)
Admission: RE | Admit: 2013-12-14 | Discharge: 2013-12-14 | Disposition: A | Discharge status: Home / Self Care | Payer: Medicare Other | Source: Ambulatory Visit | Attending: Vascular Surgery | Admitting: Vascular Surgery

## 2013-12-14 ENCOUNTER — Encounter: Payer: Self-pay | Admitting: Vascular Surgery

## 2013-12-14 VITALS — BP 149/69 | HR 63 | Resp 16 | Ht 63.0 in | Wt 132.0 lb

## 2013-12-14 DIAGNOSIS — I83893 Varicose veins of bilateral lower extremities with other complications: Secondary | ICD-10-CM

## 2013-12-14 DIAGNOSIS — I82819 Embolism and thrombosis of superficial veins of unspecified lower extremities: Secondary | ICD-10-CM | POA: Insufficient documentation

## 2013-12-14 DIAGNOSIS — M79609 Pain in unspecified limb: Secondary | ICD-10-CM

## 2013-12-14 NOTE — Progress Notes (Signed)
The patient presents today for followup of her laser ablation and sclerotherapy of painful reticular veins in her calf from 12/08/2013. She's had minimal discomfort it has been quite comfortable following the procedure  Past Medical History  Diagnosis Date  . GERD (gastroesophageal reflux disease)   . Asthma   . Osteopenia   . MVP (mitral valve prolapse)   . Iron deficiency anemia 6/13  . Incontinence   . Depression   . IBS (irritable bowel syndrome)   . Neck pain     currently being evaluated 5, 6, 6/7 vertebrae  . Bronchiectasis     History  Substance Use Topics  . Smoking status: Never Smoker   . Smokeless tobacco: Never Used  . Alcohol Use: Yes     Comment: maybe one x a year-wine    Family History  Problem Relation Age of Onset  . Spina bifida Son   . Cancer Paternal Uncle     ? type  . Hypertension Brother   . Heart disease Brother     bypass surgery  . Thyroid disease Daughter   . Thyroid disease Sister     Allergies  Allergen Reactions  . Penicillins Other (See Comments)    Arthritis hands/feet  . Tylenol With Codeine #3 [Acetaminophen-Codeine]     Nausea, dizziness    Current outpatient prescriptions:ALBUTEROL IN, Inhale into the lungs as needed., Disp: , Rfl: ;  Alum Hydroxide-Mag Carbonate (GAVISCON EXTRA STRENGTH PO), Take by mouth., Disp: , Rfl: ;  CALCIUM PO, Take by mouth., Disp: , Rfl: ;  cetirizine (ZYRTEC) 10 MG tablet, Take 10 mg by mouth as needed for allergies., Disp: , Rfl: ;  Cholecalciferol (VITAMIN D) 2000 UNITS CAPS, Take by mouth., Disp: , Rfl:  esomeprazole (NEXIUM) 40 MG capsule, Take 40 mg by mouth daily before breakfast., Disp: , Rfl: ;  FERROUS SULFATE PO, Take by mouth 3 (three) times daily., Disp: , Rfl: ;  Flaxseed, Linseed, (FLAX SEED OIL PO), Take by mouth., Disp: , Rfl: ;  FLUoxetine (PROZAC) 20 MG capsule, Take 1 capsule (20 mg total) by mouth daily., Disp: 90 capsule, Rfl: 4;  Ipratropium-Albuterol (COMBIVENT IN), Inhale into the  lungs. As needed, Disp: , Rfl:  Magnesium 400 MG CAPS, Take by mouth., Disp: , Rfl: ;  mirabegron ER (MYRBETRIQ) 50 MG TB24 tablet, Take 1 tablet (50 mg total) by mouth daily. One po qd, Disp: 30 tablet, Rfl: 12;  Mometasone Furo-Formoterol Fum (DULERA IN), Inhale into the lungs., Disp: , Rfl: ;  montelukast (SINGULAIR) 10 MG tablet, Take 10 mg by mouth at bedtime., Disp: , Rfl: ;  OMEGA-3 KRILL OIL PO, Take by mouth., Disp: , Rfl:  Probiotic Product (PROBIOTIC PO), Take by mouth., Disp: , Rfl:   BP 149/69  Pulse 63  Resp 16  Ht 5\' 3"  (1.6 m)  Wt 132 lb (59.875 kg)  BMI 23.39 kg/m2  LMP 12/23/1989  Body mass index is 23.39 kg/(m^2).       Physical exam she does have mild bruising in the medial thigh. She has occlusion of the reticular veins and this large nest of the in her medial calf.  Duplex today reveals closure of her saphenous vein from the entry site in her calf up to 1 cm below the saphenofemoral junction. There is no evidence of DVT  Impression and plan successful ablation of superficial venous hypertension with ablation of her left great saphenous vein. She will continue her compression garment used for one additional week and  then when necessary. We will see her back for additional sclerotherapy when she is resolved bruising from this current procedure.

## 2013-12-28 ENCOUNTER — Encounter: Payer: Self-pay | Admitting: *Deleted

## 2013-12-29 ENCOUNTER — Ambulatory Visit (INDEPENDENT_AMBULATORY_CARE_PROVIDER_SITE_OTHER): Payer: Medicare Other | Admitting: *Deleted

## 2013-12-29 DIAGNOSIS — I83893 Varicose veins of bilateral lower extremities with other complications: Secondary | ICD-10-CM

## 2013-12-29 NOTE — Progress Notes (Signed)
X=.3% Sotradecol administered with a 27g butterfly.  Patient received a total of 12cc.  Treated all areas of concern. Easy access. Tol very well. One more tx scheduled for later in Feb.  Photos: yes  Compression stockings applied: yes

## 2013-12-30 ENCOUNTER — Encounter: Payer: Self-pay | Admitting: *Deleted

## 2013-12-30 ENCOUNTER — Encounter: Payer: Self-pay | Admitting: Vascular Surgery

## 2014-01-19 ENCOUNTER — Telehealth: Payer: Self-pay | Admitting: Obstetrics & Gynecology

## 2014-01-19 NOTE — Telephone Encounter (Signed)
Patient wants to know the name of the dermatologist we made her an appt with for tomorrow. Doesn't know where it is or what time.

## 2014-01-19 NOTE — Telephone Encounter (Signed)
Called [facility] to confirm that patient has appointment. Has appointment with Dr. Renda Rich at 1:45 for skin check entire body. Confirmed time/date/location. Called and confirmed with patient.  Routing to provider for final review. Patient agreeable to disposition. Will close encounter

## 2014-01-19 NOTE — Telephone Encounter (Signed)
Patient is trying to figure out what the name of the dr we had her appt with for tomorrow it was a dermatologist. Needs to know what time and where also

## 2014-01-20 ENCOUNTER — Telehealth: Payer: Self-pay | Admitting: *Deleted

## 2014-01-20 NOTE — Telephone Encounter (Signed)
Pt did not pick up samples in August.  Samples discarded per office protocol.  Routing to provider for final review.  Will close encounter.

## 2014-02-07 ENCOUNTER — Encounter: Payer: Self-pay | Admitting: *Deleted

## 2014-02-08 ENCOUNTER — Encounter: Payer: Self-pay | Admitting: *Deleted

## 2014-02-09 ENCOUNTER — Ambulatory Visit: Payer: Medicare Other | Admitting: *Deleted

## 2014-02-10 ENCOUNTER — Encounter: Payer: Self-pay | Admitting: *Deleted

## 2014-02-11 ENCOUNTER — Encounter (INDEPENDENT_AMBULATORY_CARE_PROVIDER_SITE_OTHER): Payer: Self-pay

## 2014-02-11 ENCOUNTER — Encounter: Payer: Self-pay | Admitting: Vascular Surgery

## 2014-02-11 ENCOUNTER — Ambulatory Visit (INDEPENDENT_AMBULATORY_CARE_PROVIDER_SITE_OTHER): Payer: Medicare Other | Admitting: *Deleted

## 2014-02-11 DIAGNOSIS — I83893 Varicose veins of bilateral lower extremities with other complications: Secondary | ICD-10-CM

## 2014-02-11 NOTE — Progress Notes (Signed)
X=.3% Sotradecol administered with a 27g butterfly.  Patient received a total of 12cc.  Treated any remaining spiders. Her previously treated veins are aqua, firm and resolving slowly. Pt tol proced well. Will follow prn.  Photos: no  Compression stockings applied: yes

## 2014-09-08 ENCOUNTER — Other Ambulatory Visit: Payer: Self-pay

## 2014-09-08 DIAGNOSIS — Z1231 Encounter for screening mammogram for malignant neoplasm of breast: Secondary | ICD-10-CM

## 2014-09-09 ENCOUNTER — Telehealth: Payer: Self-pay | Admitting: Obstetrics & Gynecology

## 2014-09-09 NOTE — Telephone Encounter (Signed)
Spoke with patient. Appointment scheduled for 9/22 at 2:30pm with Dr.Miller. Patient agreeable to date and time. Patient states that she will reschedule her other appointment that day so she is able to come in. "This is more important. The other can be moved."  Routing to provider for final review. Patient agreeable to disposition. Will close encounter

## 2014-09-09 NOTE — Telephone Encounter (Signed)
Patient states that she has a "growth near my private area that [place] saw on my last visit and she said it was not of any concern. Well it has gotten bigger." States that is has gotten larger and is in her panty line area and is constantly being rubbed. "It is irritated. It has bled once." Patient requesting to come in to see Dr.Miller for evaluation. Advised patient will have to check scheduling and call patient back with further date recommendations. Patient is agreeable. Patient can't come on Monday or Tuesday next week.

## 2014-09-09 NOTE — Telephone Encounter (Signed)
Pt says she has a growth near her pantyline and would like an appointment with Dr. Sabra Heck.

## 2014-09-13 ENCOUNTER — Ambulatory Visit (INDEPENDENT_AMBULATORY_CARE_PROVIDER_SITE_OTHER): Payer: Medicare Other | Admitting: Obstetrics & Gynecology

## 2014-09-13 VITALS — BP 140/60 | HR 56 | Resp 20 | Wt 136.4 lb

## 2014-09-13 DIAGNOSIS — N9089 Other specified noninflammatory disorders of vulva and perineum: Secondary | ICD-10-CM

## 2014-09-13 DIAGNOSIS — L723 Sebaceous cyst: Secondary | ICD-10-CM

## 2014-09-18 ENCOUNTER — Encounter: Payer: Self-pay | Admitting: Obstetrics & Gynecology

## 2014-09-18 NOTE — Progress Notes (Signed)
Subjective:     Patient ID: [name], female   DOB: March 06, 1935, 79 y.o.   MRN: [id number]  HPI 79 yo G2P2 WF here for evaluation and possible removal of inner thigh lesion that patient reports is enlarging and now rubs along her panty line.  She states I have previously informed her the lesion is benign but she wants it removed.  She denies any bleeding or discharge from lesion.  Generally, it is mildly tender due to location in panty line and how it rubs along the edge of her underwear.  Review of Systems  Constitutional: Negative.   Gastrointestinal: Negative.   Genitourinary: Negative.   Hematological: Negative.        Objective:   Physical Exam  Constitutional: She is oriented to person, place, and time. She appears well-developed.  Abdominal: Soft. Bowel sounds are normal.  Genitourinary: Vagina normal.    There is no rash, tenderness or lesion on the right labia. There is no rash, tenderness or lesion on the left labia.  Lymphadenopathy:       Right: No inguinal adenopathy present.       Left: No inguinal adenopathy present.  Neurological: She is alert and oriented to person, place, and time.  Skin: Skin is warm and dry.  Psychiatric: She has a normal mood and affect.   As pt requests excision of lesion, consent obtained.  Risks and benefits discussed including bleeding, infection, and possible need for future procedures.  Area cleansed with Betadine x 3. 1cc of 1% lidocaine, plain, instilled above and below lesion.  With #11 blade, small incision made over lesion.  Lesion filled with sebaceous material that was drained and then cyst wall grasped and removed fully.  Silver nitrate used to cauterize within lesion and along skin edge.  No bleeding noted.  Pt tolerated procedure well.  Dressing applied.    Assessment:     Excision of left inner thigh sebaceous cyst     Plan:     Precautions given for pt to call if have unusual drainage, increased tenderness after 48  hours, increased swelling, fever.  Pt voices clear understanding.  No pathology sent.

## 2014-09-19 ENCOUNTER — Ambulatory Visit
Admission: RE | Admit: 2014-09-19 | Discharge: 2014-09-19 | Disposition: A | Discharge status: Home / Self Care | Payer: Medicare Other | Source: Ambulatory Visit

## 2014-09-19 DIAGNOSIS — Z1231 Encounter for screening mammogram for malignant neoplasm of breast: Secondary | ICD-10-CM

## 2014-09-20 ENCOUNTER — Other Ambulatory Visit: Payer: Self-pay | Admitting: Obstetrics & Gynecology

## 2014-09-20 NOTE — Telephone Encounter (Signed)
Please move up appointment for annual exam with Dr. Sabra Heck. Was due in July 2015.

## 2014-09-20 NOTE — Telephone Encounter (Signed)
Last AEX:07/01/13 Last refill:  07/01/13 #90 X 4 Current AEX:03/21/15 Last office visit: 09/13/14  Please advise

## 2014-09-23 NOTE — Telephone Encounter (Signed)
Tried to call pt, but the pt phone did not have a voicemail and it stated " the wireless customer is not available, please try your call later". Will try later

## 2014-09-26 ENCOUNTER — Telehealth: Payer: Self-pay | Admitting: Obstetrics & Gynecology

## 2014-09-26 NOTE — Telephone Encounter (Signed)
LMTCB re: AEX opening with Dr. Sabra Heck 11/11/14. Patient on wait list.

## 2014-09-27 NOTE — Telephone Encounter (Signed)
Thanks for the update.  I will close the encounter.

## 2014-09-27 NOTE — Telephone Encounter (Signed)
S/W pt. Made appt with Ms. Debbi on 10/18/14 @ 10:45 due to Dr. Sabra Heck schdule being unavailable

## 2014-10-13 ENCOUNTER — Telehealth: Payer: Self-pay | Admitting: Obstetrics & Gynecology

## 2014-10-13 NOTE — Telephone Encounter (Signed)
Patient is asking to talk with billing, no further details.

## 2014-10-18 ENCOUNTER — Encounter: Payer: Self-pay | Admitting: Certified Nurse Midwife

## 2014-10-18 ENCOUNTER — Ambulatory Visit (INDEPENDENT_AMBULATORY_CARE_PROVIDER_SITE_OTHER): Payer: Medicare Other | Admitting: Certified Nurse Midwife

## 2014-10-18 VITALS — BP 110/62 | HR 64 | Resp 16 | Ht 62.25 in | Wt 136.0 lb

## 2014-10-18 DIAGNOSIS — K644 Residual hemorrhoidal skin tags: Secondary | ICD-10-CM

## 2014-10-18 DIAGNOSIS — K648 Other hemorrhoids: Secondary | ICD-10-CM

## 2014-10-18 DIAGNOSIS — Z01419 Encounter for gynecological examination (general) (routine) without abnormal findings: Secondary | ICD-10-CM | POA: Diagnosis not present

## 2014-10-18 DIAGNOSIS — Z124 Encounter for screening for malignant neoplasm of cervix: Secondary | ICD-10-CM

## 2014-10-18 NOTE — Patient Instructions (Addendum)
EXERCISE AND DIET:  We recommended that you start or continue a regular exercise program for good health. Regular exercise means any activity that makes your heart beat faster and makes you sweat.  We recommend exercising at least 30 minutes per day at least 3 days a week, preferably 4 or 5.  We also recommend a diet low in fat and sugar.  Inactivity, poor dietary choices and obesity can cause diabetes, heart attack, stroke, and kidney damage, among others.    ALCOHOL AND SMOKING:  Women should limit their alcohol intake to no more than 7 drinks/beers/glasses of wine (combined, not each!) per week. Moderation of alcohol intake to this level decreases your risk of breast cancer and liver damage. And of course, no recreational drugs are part of a healthy lifestyle.  And absolutely no smoking or even second hand smoke. Most people know smoking can cause heart and lung diseases, but did you know it also contributes to weakening of your bones? Aging of your skin?  Yellowing of your teeth and nails?  CALCIUM AND VITAMIN D:  Adequate intake of calcium and Vitamin D are recommended.  The recommendations for exact amounts of these supplements seem to change often, but generally speaking 600 mg of calcium (either carbonate or citrate) and 800 units of Vitamin D per day seems prudent. Certain women may benefit from higher intake of Vitamin D.  If you are among these women, your doctor will have told you during your visit.    PAP SMEARS:  Pap smears, to check for cervical cancer or precancers,  have traditionally been done yearly, although recent scientific advances have shown that most women can have pap smears less often.  However, every woman still should have a physical exam from her gynecologist every year. It will include a breast check, inspection of the vulva and vagina to check for abnormal growths or skin changes, a visual exam of the cervix, and then an exam to evaluate the size and shape of the uterus and  ovaries.  And after 77 years of age, a rectal exam is indicated to check for rectal cancers. We will also provide age appropriate advice regarding health maintenance, like when you should have certain vaccines, screening for sexually transmitted diseases, bone density testing, colonoscopy, mammograms, etc.   MAMMOGRAMS:  All women over 40 years old should have a yearly mammogram. Many facilities now offer a "3D" mammogram, which may cost around $50 extra out of pocket. If possible,  we recommend you accept the option to have the 3D mammogram performed.  It both reduces the number of women who will be called back for extra views which then turn out to be normal, and it is better than the routine mammogram at detecting truly abnormal areas.    COLONOSCOPY:  Colonoscopy to screen for colon cancer is recommended for all women at age 50.  We know, you hate the idea of the prep.  We agree, BUT, having colon cancer and not knowing it is worse!!  Colon cancer so often starts as a polyp that can be seen and removed at colonscopy, which can quite literally save your life!  And if your first colonoscopy is normal and you have no family history of colon cancer, most women don't have to have it again for 10 years.  Once every ten years, you can do something that may end up saving your life, right?  We will be happy to help you get it scheduled when you are ready.    Be sure to check your insurance coverage so you understand how much it will cost.  It may be covered as a preventative service at no cost, but you should check your particular policy.     Hemorrhoids Hemorrhoids are swollen veins around the rectum or anus. There are two types of hemorrhoids:   Internal hemorrhoids. These occur in the veins just inside the rectum. They may poke through to the outside and become irritated and painful.  External hemorrhoids. These occur in the veins outside the anus and can be felt as a painful swelling or hard lump near the  anus. CAUSES  Pregnancy.   Obesity.   Constipation or diarrhea.   Straining to have a bowel movement.   Sitting for long periods on the toilet.  Heavy lifting or other activity that caused you to strain.  Anal intercourse. SYMPTOMS   Pain.   Anal itching or irritation.   Rectal bleeding.   Fecal leakage.   Anal swelling.   One or more lumps around the anus.  DIAGNOSIS  Your caregiver may be able to diagnose hemorrhoids by visual examination. Other examinations or tests that may be performed include:   Examination of the rectal area with a gloved hand (digital rectal exam).   Examination of anal canal using a small tube (scope).   A blood test if you have lost a significant amount of blood.  A test to look inside the colon (sigmoidoscopy or colonoscopy). TREATMENT Most hemorrhoids can be treated at home. However, if symptoms do not seem to be getting better or if you have a lot of rectal bleeding, your caregiver may perform a procedure to help make the hemorrhoids get smaller or remove them completely. Possible treatments include:   Placing a rubber band at the base of the hemorrhoid to cut off the circulation (rubber band ligation).   Injecting a chemical to shrink the hemorrhoid (sclerotherapy).   Using a tool to burn the hemorrhoid (infrared light therapy).   Surgically removing the hemorrhoid (hemorrhoidectomy).   Stapling the hemorrhoid to block blood flow to the tissue (hemorrhoid stapling).  HOME CARE INSTRUCTIONS   Eat foods with fiber, such as whole grains, beans, nuts, fruits, and vegetables. Ask your doctor about taking products with added fiber in them (fibersupplements).  Increase fluid intake. Drink enough water and fluids to keep your urine clear or pale yellow.   Exercise regularly.   Go to the bathroom when you have the urge to have a bowel movement. Do not wait.   Avoid straining to have bowel movements.   Keep the  anal area dry and clean. Use wet toilet paper or moist towelettes after a bowel movement.   Medicated creams and suppositories may be used or applied as directed.   Only take over-the-counter or prescription medicines as directed by your caregiver.   Take warm sitz baths for 15-20 minutes, 3-4 times a day to ease pain and discomfort.   Place ice packs on the hemorrhoids if they are tender and swollen. Using ice packs between sitz baths may be helpful.   Put ice in a plastic bag.   Place a towel between your skin and the bag.   Leave the ice on for 15-20 minutes, 3-4 times a day.   Do not use a donut-shaped pillow or sit on the toilet for long periods. This increases blood pooling and pain.  SEEK MEDICAL CARE IF:  You have increasing pain and swelling that is not controlled by treatment  or medicine.  You have uncontrolled bleeding.  You have difficulty or you are unable to have a bowel movement.  You have pain or inflammation outside the area of the hemorrhoids. MAKE SURE YOU:  Understand these instructions.  Will watch your condition.  Will get help right away if you are not doing well or get worse. Document Released: 12/06/2000 Document Revised: 11/25/2012 Document Reviewed: 10/13/2012 [facility] Patient Information 2015 [place], [place]. This information is not intended to replace advice given to you by your health care provider. Make sure you discuss any questions you have with your health care provider.

## 2014-10-18 NOTE — Progress Notes (Signed)
Reviewed personally.  M. Suzanne Catrice Zuleta, MD.  

## 2014-10-18 NOTE — Progress Notes (Signed)
79 y.o. [id number] Married Caucasian Fe here for annual exam. Menopausal no HRT.Denies vaginal bleeding, occasional vaginal dryness. Sees PCP for aex and labs, also see cardiology as needed Pulmonary for asthma.. Ambulating well without problems. "Enjoying life" Continues with stress incontinence only with coughing a lot and fecal incontinence only with loose stools, occasional. Mybretriq working well for urinary frequency, uses as needed. No Rx needed at this time. Has hemorrhoid flare at present with stool change occasional bleeding with wiping only. Denies black or tarry stools. Patient scheduling appointment with Dr. Collene Mares for evaluation of loose stools. No other health issues today  Patient's last menstrual period was 12/23/1989.          Sexually active: No.  The current method of family planning is tubal ligation.    Exercising: Yes.    walking Smoker:  no  Health Maintenance: Pap:  06-04-12 neg MMG: 09-19-14 density category b, birads category 1:neg Colonoscopy:  2013 polyps 7 years BMD:   2013 osteopenia TDaP:  2011 Labs: none Self breast exam: done occ   reports that she has never smoked. She has never used smokeless tobacco. She reports that she does not drink alcohol or use illicit drugs.  Past Medical History  Diagnosis Date  . GERD (gastroesophageal reflux disease)   . Asthma   . Osteopenia   . MVP (mitral valve prolapse)   . Iron deficiency anemia 6/13  . Incontinence   . Depression   . IBS (irritable bowel syndrome)   . Neck pain     currently being evaluated 5, 6, 6/7 vertebrae  . Bronchiectasis   . Cervical stenosis of spinal canal     Past Surgical History  Procedure Laterality Date  . Breast biopsy    . Tubal ligation  1992  . Hemorrhoid surgery    . Endovenous ablation saphenous vein w/ laser Left 12-08-2013    left greater saphenous vein and sclerotherapy left leg by Curt Jews MD    Current Outpatient Prescriptions  Medication Sig Dispense Refill  .  ALBUTEROL IN Inhale into the lungs as needed.      . Alum Hydroxide-Mag Carbonate (GAVISCON EXTRA STRENGTH PO) Take by mouth.      Marland Kitchen CALCIUM PO Take by mouth.      . cetirizine (ZYRTEC) 10 MG tablet Take 10 mg by mouth as needed for allergies.      . Cholecalciferol (VITAMIN D) 2000 UNITS CAPS Take by mouth.      . Cyanocobalamin (VITAMIN B-12) 2500 MCG SUBL Place under the tongue.      Marland Kitchen esomeprazole (NEXIUM) 40 MG capsule Take 40 mg by mouth daily before breakfast.      . FERROUS SULFATE PO Take by mouth 3 (three) times daily.      . Flaxseed, Linseed, (FLAX SEED OIL PO) Take by mouth.      Marland Kitchen FLUoxetine (PROZAC) 20 MG capsule TAKE (1) CAPSULE DAILY  90 capsule  0  . fluticasone (FLONASE) 50 MCG/ACT nasal spray Place 2 sprays into the nose.      . Magnesium 400 MG CAPS Take by mouth.      . mirabegron ER (MYRBETRIQ) 50 MG TB24 tablet Take 1 tablet (50 mg total) by mouth daily. One po qd  30 tablet  12  . montelukast (SINGULAIR) 10 MG tablet Take 10 mg by mouth at bedtime.      . OMEGA-3 KRILL OIL PO Take by mouth.      . Oxybutynin Chloride (GELNIQUE)  10 % GEL Place onto the skin daily.      . Probiotic Product (PROBIOTIC PO) Take by mouth.       No current facility-administered medications for this visit.    Family History  Problem Relation Age of Onset  . Spina bifida Son   . Cancer Paternal Uncle     ? type  . Hypertension Brother   . Heart disease Brother     bypass surgery  . Thyroid disease Daughter   . Thyroid disease Sister     ROS:  Pertinent items are noted in HPI.  Otherwise, a comprehensive ROS was negative.  Exam:   BP 110/62  Pulse 64  Resp 16  Ht 5' 2.25" (1.581 m)  Wt 136 lb (61.689 kg)  BMI 24.68 kg/m2  LMP 12/23/1989 Height: 5' 2.25" (158.1 cm)  Ht Readings from Last 3 Encounters:  10/18/14 5' 2.25" (1.581 m)  12/14/13 5\' 3"  (1.6 m)  12/08/13 5\' 3"  (1.6 m)    General appearance: alert, cooperative and appears stated age Head: Normocephalic, without  obvious abnormality, atraumatic Neck: no adenopathy, supple, symmetrical, trachea midline and thyroid normal to inspection and palpation Lungs: clear to auscultation bilaterally occasional wheeze Breasts: normal appearance, no masses or tenderness, No nipple retraction or dimpling, No nipple discharge or bleeding, No axillary or supraclavicular adenopathy Heart: regular rate and rhythm Abdomen: soft, non-tender; no masses,  no organomegaly Extremities: extremities normal, atraumatic, no cyanosis or edema Skin: Skin color, texture, turgor normal. No rashes or lesions Lymph nodes: Cervical, supraclavicular, and axillary nodes normal. No abnormal inguinal nodes palpated Neurologic: Grossly normal   Pelvic: External genitalia:  no lesions              Urethra:  normal appearing urethra with no masses, tenderness or lesions              Bartholin's and Skene's: normal                 Vagina: normal appearing vagina with normal color and discharge, no lesions, mild cystocele noted              Cervix: normal,non tender, no lesions              Pap taken: No. Bimanual Exam:  Uterus:  normal size, contour, position, consistency, mobility, non-tender and mid position              Adnexa: normal adnexa and no mass, fullness, tenderness               Rectovaginal: Confirms               Anus:  normal sphincter tone, hemorrhoid noted, not thrombosed, slightly tender, no bleeding noted A:  Well Woman with normal exam  Menopausal no HRT  Stress incontinence and stool incontinence, no change  Hemorrhoid flare, plans to see Dr Collene Mares soon  Asthma managed with Pulmonologist  Anemia with PCP management  Osteopenia with PCP management  P:   Reviewed health and wellness pertinent to exam  Aware of need to evaluate if vaginal bleeding  Continue to work on [employer]  Follow up with MD as recommended and with Dr. Collene Mares regarding loose stools  Pap smear not taken today   Labs with PCP copies to scan  today   counseled on breast self exam, mammography screening, adequate intake of calcium and vitamin D, diet and exercise, Kegel's exercises  return annually or prn  An After Visit Summary  was printed and given to the patient.

## 2014-10-24 ENCOUNTER — Encounter: Payer: Self-pay | Admitting: Certified Nurse Midwife

## 2014-12-14 ENCOUNTER — Other Ambulatory Visit: Payer: Self-pay | Admitting: Obstetrics and Gynecology

## 2014-12-14 NOTE — Telephone Encounter (Signed)
Medication refill request: Prozac 20 mg  Last AEX:  10/18/14 with Ms. Debbie Next AEX: 10/20/15 with Ms. Debbie Last MMG (if hormonal medication request): N/A Refill authorized: Please advise.

## 2015-03-21 ENCOUNTER — Ambulatory Visit: Payer: Medicare Other | Admitting: Obstetrics & Gynecology

## 2015-10-04 ENCOUNTER — Other Ambulatory Visit: Payer: Self-pay | Admitting: Obstetrics and Gynecology

## 2015-10-04 NOTE — Telephone Encounter (Signed)
Medication refill request: Prozac  Last AEX:  10/18/14 DL Next AEX: 10/20/15 SM Last MMG (if hormonal medication request): 09/20/14 BIRADS1:Neg Refill authorized: 12/14/14 #[id number]/2R. Today please advise.

## 2015-10-05 NOTE — Telephone Encounter (Signed)
Patient needs call regarding refill. She was not sure she was going to continue use.

## 2015-10-05 NOTE — Telephone Encounter (Signed)
Patient states she tried to get off of Rx but in a month she had to go back to it. She has been on it since then. She only need 1 refill until her AEX with Dr. Sabra Heck. Please advise.

## 2015-10-06 ENCOUNTER — Other Ambulatory Visit: Payer: Self-pay | Admitting: Obstetrics and Gynecology

## 2015-10-06 NOTE — Telephone Encounter (Signed)
Will refill until OV

## 2015-10-06 NOTE — Telephone Encounter (Signed)
Patient notified- Rx sent to pharmacy. 

## 2015-10-20 ENCOUNTER — Ambulatory Visit (INDEPENDENT_AMBULATORY_CARE_PROVIDER_SITE_OTHER): Payer: Medicare Other | Admitting: Obstetrics & Gynecology

## 2015-10-20 ENCOUNTER — Encounter: Payer: Self-pay | Admitting: Obstetrics & Gynecology

## 2015-10-20 VITALS — BP 132/70 | HR 72 | Resp 16 | Ht 62.0 in | Wt 130.0 lb

## 2015-10-20 DIAGNOSIS — N9089 Other specified noninflammatory disorders of vulva and perineum: Secondary | ICD-10-CM

## 2015-10-20 DIAGNOSIS — L723 Sebaceous cyst: Secondary | ICD-10-CM

## 2015-10-20 DIAGNOSIS — Z01419 Encounter for gynecological examination (general) (routine) without abnormal findings: Secondary | ICD-10-CM | POA: Diagnosis not present

## 2015-10-20 DIAGNOSIS — Z124 Encounter for screening for malignant neoplasm of cervix: Secondary | ICD-10-CM | POA: Diagnosis not present

## 2015-10-20 NOTE — Progress Notes (Signed)
80 y.o. [id number] MarriedCaucasianF here for annual exam.  Pt reports she had shingles this year.  Showed it to her pulmonologist after several days and was diagnosed.  Was not treated as it was "so many days" advanced.  Symptoms started September.  Pt still says it is itchy.    Saw Dr. Laurance Flatten, [place], in early October.    Had MRI done on liver.  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  Repeat MRI done 9/16 and showed stable findings.    H/O lung nodules that are stable.    Saw Dr Florene Glen, GI, in late august.    PCP Dr. Lanell Persons, at [facility], in July 2014.    Denies vaginal bleeding.  Pt has a place on her buttocks that feels inflamed and she would like to look at it today.  She also has another sebaceous cyst that is on her left labia.  It is hard and can be very sore.  She would like it removed.  Advised pt these are benign and can be recurrent and should really only remove symptomatic ones.  She really does desire removal/excision/destruction--whatever it takes.  Patient's last menstrual period was 12/23/1989.          Sexually active: No.  The current method of family planning is tubal ligation.    Exercising: Yes.    walking Smoker:  Former smoker-years ago  Health Maintenance: Pap:  06/04/12 WNL History of abnormal Pap:  no MMG:  09/19/14 3D BiRads 1-negative Colonoscopy:  12013-repeat in 7 years, Dr. Collene Mares BMD:   2013-osteopenia TDaP:  2009 Screening Labs: PCP, Hb today: PCP, Urine today: PCP   reports that she has quit smoking. She has never used smokeless tobacco. She reports that she drinks alcohol. She reports that she does not use illicit drugs.  Past Medical History  Diagnosis Date  . GERD (gastroesophageal reflux disease)   . Asthma   . Osteopenia   . MVP (mitral valve prolapse)   . Iron deficiency anemia 6/13  . Incontinence   . Depression   . IBS (irritable bowel syndrome)   . Neck pain     currently being  evaluated 5, 6, 6/7 vertebrae  . Bronchiectasis ([place])   . Cervical stenosis of spinal canal   . Shingles 08/24/15    Past Surgical History  Procedure Laterality Date  . Breast biopsy    . Tubal ligation  1992  . Hemorrhoid surgery    . Endovenous ablation saphenous vein w/ laser Left 12-08-2013    left greater saphenous vein and sclerotherapy left leg by Curt Jews MD    Current Outpatient Prescriptions  Medication Sig Dispense Refill  . ALBUTEROL IN Inhale into the lungs as needed.    . Alum Hydroxide-Mag Carbonate (GAVISCON EXTRA STRENGTH PO) Take by mouth.    . Alum Hydroxide-Mag Carbonate (GAVISCON PO) Take by mouth.    Marland Kitchen CALCIUM PO Take by mouth.    . cetirizine (ZYRTEC) 10 MG tablet Take 10 mg by mouth as needed for allergies.    . Cholecalciferol (VITAMIN D) 2000 UNITS CAPS Take by mouth.    . Cyanocobalamin (VITAMIN B-12) 2500 MCG SUBL Place under the tongue.    . dicyclomine (BENTYL) 10 MG capsule TAKE 1 CAPSULE FOUR TIMES DAILY BEFORE MEALS AND AT BEDTIME AS NEEDED    . FERROUS SULFATE PO Take by mouth 3 (three) times daily.    . Flaxseed, Linseed, (FLAX SEED OIL PO)  Take by mouth.    Marland Kitchen FLUoxetine (PROZAC) 20 MG capsule TAKE (1) CAPSULE DAILY 30 capsule 0  . fluticasone (FLONASE) 50 MCG/ACT nasal spray Place 2 sprays into the nose.    . Magnesium 400 MG CAPS Take by mouth.    . mirabegron ER (MYRBETRIQ) 50 MG TB24 tablet Take 1 tablet (50 mg total) by mouth daily. One po qd 30 tablet 12  . montelukast (SINGULAIR) 10 MG tablet Take 10 mg by mouth at bedtime.    . OMEGA-3 KRILL OIL PO Take by mouth.    . Oxybutynin Chloride (GELNIQUE) 10 % GEL Place onto the skin daily.    . Probiotic Product (PROBIOTIC PO) Take by mouth.    . sucralfate (CARAFATE) 1 G tablet Take 1 g by mouth.     No current facility-administered medications for this visit.    Family History  Problem Relation Age of Onset  . Spina bifida Son   . Cancer Paternal Uncle     ? type  . Hypertension  Brother   . Heart disease Brother     bypass surgery  . Thyroid disease Daughter   . Thyroid disease Sister     ROS:  Pertinent items are noted in HPI.  Otherwise, a comprehensive ROS was negative.  Exam:   BP 132/70 mmHg  Pulse 72  Resp 16  Ht 5\' 2"  (1.575 m)  Wt 130 lb (58.968 kg)  BMI 23.77 kg/m2  LMP 12/23/1989    Height: 5\' 2"  (157.5 cm)  Ht Readings from Last 3 Encounters:  10/20/15 5\' 2"  (1.575 m)  10/18/14 5' 2.25" (1.581 m)  12/14/13 5\' 3"  (1.6 m)    General appearance: alert, cooperative and appears stated age Head: Normocephalic, without obvious abnormality, atraumatic Neck: no adenopathy, supple, symmetrical, trachea midline and thyroid normal to inspection and palpation Lungs: clear to auscultation bilaterally Breasts: normal appearance, no masses or tenderness Heart: regular rate and rhythm Abdomen: soft, non-tender; bowel sounds normal; no masses,  no organomegaly Extremities: extremities normal, atraumatic, no cyanosis or edema Skin: Skin color, texture, turgor normal except for right mid rib on side, erythematous and raised patch of skin.  Scale present around the outside of it.   Lymph nodes: Cervical, supraclavicular, and axillary nodes normal. No abnormal inguinal nodes palpated Neurologic: Grossly normal   Pelvic: External genitalia:  no lesions, several small sebaceous cysts but one mid left labia majora present.  It is hard and tender on exam.  No surrounding cellulitis              Urethra:  normal appearing urethra with no masses, tenderness or lesions              Bartholins and Skenes: normal                 Vagina: normal appearing vagina with normal color and discharge, no lesions              Cervix: no lesions              Pap taken: Yes.   Bimanual Exam:  Uterus:  normal size, contour, position, consistency, mobility, non-tender              Adnexa: normal adnexa and no mass, fullness, tenderness               Rectovaginal: Confirms                Anus:  normal sphincter tone,  no lesions  Procedure:  Sterile betadine prep applied to skin.  0.5% Xylocaine plain used to anesthetize skin.  1.72mm skin incision made with #11 blade.  Cyst opened and pushed through incision.  Cyst wall grasped and entire cyst with contents removed.  No pathology sent.  Silver nitrate used for hemostasis.  Pt tolerated procedure well.    Chaperone was present for exam.  A:  Well Woman with normal exam Bronchiectasis H/O dysphagia GERD OAB H/O IBS, liver hemangiomas, 13mm cyst H/O spinal stenosis H/O ostoeporosis PMP, no HRT t  P:   Mammogram yearly pap smear obtained today Pt will continue to see Dr. Amalia Hailey (urology), Dr. Florene Glen (GI), Dr. Laurance Flatten (Pulmonology), and Dr. Lanell Persons. Vaccine documentation from [place] added to [facility] records today. Removal/destruction of vulvar sebaceous cyst Return annually or prn

## 2015-10-21 NOTE — Addendum Note (Signed)
Addended by: Megan Salon on: 10/21/2015 04:37 AM   Modules accepted: Miquel Dunn

## 2015-10-23 LAB — IPS PAP SMEAR ONLY

## 2015-10-26 ENCOUNTER — Other Ambulatory Visit: Payer: Self-pay

## 2015-10-26 DIAGNOSIS — Z1231 Encounter for screening mammogram for malignant neoplasm of breast: Secondary | ICD-10-CM

## 2015-11-06 ENCOUNTER — Other Ambulatory Visit: Payer: Self-pay | Admitting: Certified Nurse Midwife

## 2015-11-06 NOTE — Telephone Encounter (Signed)
Medication refill request: Prozac Last AEX:  01-24-15 Next AEX: 02-04-17 Last MMG (if hormonal medication request): 09-19-14 WNL Refill authorized: please advise

## 2015-12-07 ENCOUNTER — Ambulatory Visit
Admission: RE | Admit: 2015-12-07 | Discharge: 2015-12-07 | Disposition: A | Discharge status: Home / Self Care | Payer: Medicare Other | Source: Ambulatory Visit

## 2015-12-07 DIAGNOSIS — Z1231 Encounter for screening mammogram for malignant neoplasm of breast: Secondary | ICD-10-CM

## 2016-06-03 ENCOUNTER — Encounter: Payer: Self-pay | Admitting: Family

## 2016-06-03 ENCOUNTER — Ambulatory Visit (INDEPENDENT_AMBULATORY_CARE_PROVIDER_SITE_OTHER): Payer: Medicare Other | Admitting: Family

## 2016-06-03 VITALS — BP 139/64 | HR 82 | Temp 97.7°F | Ht 62.0 in | Wt 130.5 lb

## 2016-06-03 DIAGNOSIS — S3092XA Unspecified superficial injury of abdominal wall, initial encounter: Secondary | ICD-10-CM

## 2016-06-03 DIAGNOSIS — W57XXXA Bitten or stung by nonvenomous insect and other nonvenomous arthropods, initial encounter: Secondary | ICD-10-CM

## 2016-06-03 MED ORDER — DOXYCYCLINE HYCLATE 100 MG PO TABS: 200.0000 mg | ORAL_TABLET | Freq: Once | ORAL | Status: DC

## 2016-06-03 NOTE — Progress Notes (Signed)
   Subjective:    Patient ID: [name], female    DOB: 05-18-1935, 81 y.o.   MRN: [id number]  HPI Pr presents to the office today for a tick bite that she noticed about two weeks ago. Pt states she noticed the tick on her right lower side and removed it then. Pt states she is unsure how long it was attached, but believes it was "that long". Pt states she has one onset of generalized joint pain that lasted for a few minutes, but then went away. PT denies any rash or fever.    Review of Systems  Constitutional: Negative.   HENT: Negative.   Eyes: Negative.   Respiratory: Negative.  Negative for shortness of breath.   Cardiovascular: Negative.  Negative for palpitations.  Gastrointestinal: Negative.   Endocrine: Negative.   Genitourinary: Negative.   Musculoskeletal: Negative.   Neurological: Negative.  Negative for headaches.  Hematological: Negative.   Psychiatric/Behavioral: Negative.   All other systems reviewed and are negative.      Objective:   Physical Exam  Constitutional: She is oriented to person, place, and time. She appears well-developed and well-nourished. No distress.  HENT:  Head: Normocephalic.  Eyes: Pupils are equal, round, and reactive to light.  Neck: Normal range of motion. Neck supple. No thyromegaly present.  Cardiovascular: Normal rate, regular rhythm, normal heart sounds and intact distal pulses.   No murmur heard. Pulmonary/Chest: Effort normal and breath sounds normal. No respiratory distress. She has no wheezes.  Abdominal: Soft. Bowel sounds are normal. She exhibits no distension. There is no tenderness.  Musculoskeletal: Normal range of motion. She exhibits no edema or tenderness.  Neurological: She is alert and oriented to person, place, and time.  Skin: Skin is warm and dry.  Psychiatric: She has a normal mood and affect. Her behavior is normal. Judgment and thought content normal.  Vitals reviewed.     BP 139/64 mmHg  Pulse 82   Temp(Src) 97.7 F (36.5 C) (Oral)  Ht 5\' 2"  (1.575 m)  Wt 130 lb 8 oz (59.194 kg)  BMI 23.86 kg/m2  LMP 12/23/1989     Assessment & Plan:  1. Tick bite --Pt to report any new fever, joint pain, or rash -Wear protective clothing while outside- Long sleeves and long pants -Put insect repellent on all exposed skin and along clothing -Take a shower as soon as possible after being outside -RTO prn - Rocky mtn spotted fvr abs pnl(IgG+IgM) - Lyme Ab/Western Blot Reflex - doxycycline (VIBRA-TABS) 100 MG tablet; Take 2 tablets (200 mg total) by mouth once.  Dispense: 2 tablet; Refill: 0  Evelina Dun, FNP

## 2016-06-03 NOTE — Patient Instructions (Signed)
Tick Bite Information Ticks are insects that attach themselves to the skin and draw blood for food. There are various types of ticks. Common types include wood ticks and deer ticks. Most ticks live in shrubs and grassy areas. Ticks can climb onto your body when you make contact with leaves or grass where the tick is waiting. The most common places on the body for ticks to attach themselves are the scalp, neck, armpits, waist, and groin. Most tick bites are harmless, but sometimes ticks carry germs that cause diseases. These germs can be spread to a person during the tick's feeding process. The chance of a disease spreading through a tick bite depends on:   The type of tick.  Time of year.   How long the tick is attached.   Geographic location.  HOW CAN YOU PREVENT TICK BITES? Take these steps to help prevent tick bites when you are outdoors:  Wear protective clothing. Long sleeves and long pants are best.   Wear white clothes so you can see ticks more easily.  Tuck your pant legs into your socks.   If walking on a trail, stay in the middle of the trail to avoid brushing against bushes.  Avoid walking through areas with long grass.  Put insect repellent on all exposed skin and along boot tops, pant legs, and sleeve cuffs.   Check clothing, hair, and skin repeatedly and before going inside.   Brush off any ticks that are not attached.  Take a shower or bath as soon as possible after being outdoors.  WHAT IS THE PROPER WAY TO REMOVE A TICK? Ticks should be removed as soon as possible to help prevent diseases caused by tick bites. 1. If latex gloves are available, put them on before trying to remove a tick.  2. Using fine-point tweezers, grasp the tick as close to the skin as possible. You may also use curved forceps or a tick removal tool. Grasp the tick as close to its head as possible. Avoid grasping the tick on its body. 3. Pull gently with steady upward pressure until  the tick lets go. Do not twist the tick or jerk it suddenly. This may break off the tick's head or mouth parts. 4. Do not squeeze or crush the tick's body. This could force disease-carrying fluids from the tick into your body.  5. After the tick is removed, wash the bite area and your hands with soap and water or other disinfectant such as alcohol. 6. Apply a small amount of antiseptic cream or ointment to the bite site.  7. Wash and disinfect any instruments that were used.  Do not try to remove a tick by applying a hot match, petroleum jelly, or fingernail polish to the tick. These methods do not work and may increase the chances of disease being spread from the tick bite.  WHEN SHOULD YOU SEEK MEDICAL CARE? Contact your health care provider if you are unable to remove a tick from your skin or if a part of the tick breaks off and is stuck in the skin.  After a tick bite, you need to be aware of signs and symptoms that could be related to diseases spread by ticks. Contact your health care provider if you develop any of the following in the days or weeks after the tick bite:  Unexplained fever.  Rash. A circular rash that appears days or weeks after the tick bite may indicate the possibility of Lyme disease. The rash may resemble   a target with a bull's-eye and may occur at a different part of your body than the tick bite.  Redness and swelling in the area of the tick bite.   Tender, swollen lymph glands.   Diarrhea.   Weight loss.   Cough.   Fatigue.   Muscle, joint, or bone pain.   Abdominal pain.   Headache.   Lethargy or a change in your level of consciousness.  Difficulty walking or moving your legs.   Numbness in the legs.   Paralysis.  Shortness of breath.   Confusion.   Repeated vomiting.    This information is not intended to replace advice given to you by your health care provider. Make sure you discuss any questions you have with your health  care provider.   Document Released: 12/06/2000 Document Revised: 12/30/2014 Document Reviewed: 05/19/2013 Elsevier Interactive Patient Education 2016 [employer]  

## 2016-06-05 LAB — LYME AB/WESTERN BLOT REFLEX
LYME DISEASE AB, QUANT, IGM: <0.8 index (ref 0.00–0.79)
Lyme IgG/IgM Ab: <0.91 {ISR} (ref 0.00–0.90)

## 2016-06-05 LAB — ROCKY MTN SPOTTED FVR ABS PNL(IGG+IGM)
RMSF IgG: NEGATIVE
RMSF IgM: 0.18 index (ref 0.00–0.89)

## 2016-06-05 NOTE — Progress Notes (Signed)
Patient is aware of results.

## 2016-11-04 ENCOUNTER — Other Ambulatory Visit: Payer: Self-pay | Admitting: Obstetrics & Gynecology

## 2016-11-04 DIAGNOSIS — Z1231 Encounter for screening mammogram for malignant neoplasm of breast: Secondary | ICD-10-CM

## 2016-11-13 ENCOUNTER — Other Ambulatory Visit: Payer: Self-pay | Admitting: Obstetrics & Gynecology

## 2016-11-13 NOTE — Telephone Encounter (Signed)
Medication refill request: FLUoxetine 20mg  Last AEX:  10/20/15 SM Next AEX: 02/04/17  Last MMG (if hormonal medication request): 12/07/15 BIRADS 1 negative; MMG scheduled for 12/09/16 Refill authorized: 11/06/15 #30 w/13 refills; today please advise

## 2016-11-19 NOTE — Telephone Encounter (Signed)
[facility] is calling to check on the status of a refill request for Fluoxetine.

## 2016-11-22 ENCOUNTER — Encounter: Payer: Self-pay | Admitting: Obstetrics & Gynecology

## 2016-11-22 ENCOUNTER — Ambulatory Visit (INDEPENDENT_AMBULATORY_CARE_PROVIDER_SITE_OTHER): Payer: Medicare Other | Admitting: Obstetrics & Gynecology

## 2016-11-22 VITALS — BP 138/60 | HR 70 | Resp 14 | Ht 61.5 in | Wt 133.0 lb

## 2016-11-22 DIAGNOSIS — Z01419 Encounter for gynecological examination (general) (routine) without abnormal findings: Secondary | ICD-10-CM | POA: Diagnosis not present

## 2016-11-22 DIAGNOSIS — M8580 Other specified disorders of bone density and structure, unspecified site: Secondary | ICD-10-CM

## 2016-11-22 DIAGNOSIS — Z124 Encounter for screening for malignant neoplasm of cervix: Secondary | ICD-10-CM | POA: Diagnosis not present

## 2016-11-22 DIAGNOSIS — K648 Other hemorrhoids: Secondary | ICD-10-CM | POA: Diagnosis not present

## 2016-11-22 MED ORDER — FLUOXETINE HCL 20 MG PO CAPS: 20.0000 mg | ORAL_CAPSULE | Freq: Every day | ORAL | 4 refills | Status: DC

## 2016-11-22 NOTE — Progress Notes (Signed)
81 y.o. [id number] MarriedCaucasianF here for annual exam.  Doing well.  Since I saw her last, she's had follow-up with Dr. Florene Glen the GI and had MRI of pancreas.  Lesion was stable.  Repeat MRI in 1-2 years.  Has also Dr. Laurance Flatten, Pulmonology, at [facility] and had CT 11/11/16.  Lung nodularity is being followed.  No vaginal bleeding.  Is having some current issues with hemorrhoids.  PCP:  Dr. Lanell Persons.  [facility].    Patient's last menstrual period was 12/23/1989.          Sexually active: No.  The current method of family planning is tubal ligation.    Exercising: Yes  walking Smoker:  Former smoker  Health Maintenance: Pap:  10/20/15 negative  History of abnormal Pap:  no MMG: 12/07/15 BIRADS 1 negative  Colonoscopy:  2013- Dr. Collene Mares- repeat 7 years BMD:   2013- osteopenia TDaP:  12/24/07  Pneumonia vaccine(s):  12/23/08, 01/22/13 Zostavax:   12/23/10 Hep C testing: not indicated  Screening Labs: PCP, Hb today: PCP, Urine today: PCP   reports that she has quit smoking. She has never used smokeless tobacco. She reports that she drinks alcohol. She reports that she does not use drugs.  Past Medical History:  Diagnosis Date  . Asthma   . Bronchiectasis ([place])   . Cervical stenosis of spinal canal   . Depression   . GERD (gastroesophageal reflux disease)   . IBS (irritable bowel syndrome)   . Incontinence   . Iron deficiency anemia 6/13  . MVP (mitral valve prolapse)   . Neck pain    currently being evaluated 5, 6, 6/7 vertebrae  . Osteopenia   . Shingles 08/24/15    Past Surgical History:  Procedure Laterality Date  . BREAST BIOPSY    . ENDOVENOUS ABLATION SAPHENOUS VEIN W/ LASER Left 12-08-2013   left greater saphenous vein and sclerotherapy left leg by Curt Jews MD  . HEMORRHOID SURGERY    . TUBAL LIGATION  1992    Current Outpatient Prescriptions  Medication Sig Dispense Refill  . ALBUTEROL IN Inhale into the lungs as needed.    . Alum Hydroxide-Mag Carbonate (GAVISCON EXTRA STRENGTH  PO) Take by mouth.    Marland Kitchen CALCIUM PO Take by mouth.    . cetirizine (ZYRTEC) 10 MG tablet Take 10 mg by mouth as needed for allergies.    . Cholecalciferol (VITAMIN D) 2000 UNITS CAPS Take by mouth.    . Cyanocobalamin (VITAMIN B-12) 2500 MCG SUBL Place under the tongue.    . Flaxseed, Linseed, (FLAX SEED OIL PO) Take by mouth.    Marland Kitchen FLUoxetine (PROZAC) 20 MG capsule TAKE (1) CAPSULE DAILY 30 capsule 0  . fluticasone (FLONASE) 50 MCG/ACT nasal spray Place 2 sprays into the nose.    . folic acid (FOLVITE) [id number] MCG tablet Take by mouth.    . hypromellose (GENTEAL) 0.3 % GEL ophthalmic ointment Place into both eyes 2 times daily.    . Magnesium 400 MG CAPS Take by mouth.    . mirabegron ER (MYRBETRIQ) 50 MG TB24 tablet Take 1 tablet (50 mg total) by mouth daily. One po qd 30 tablet 12  . montelukast (SINGULAIR) 10 MG tablet Take 10 mg by mouth at bedtime.    . OMEGA-3 KRILL OIL PO Take by mouth.    . ondansetron (ZOFRAN-ODT) 4 MG disintegrating tablet Place one tablet under your tongue as needed for nausea    . Probiotic Product (PROBIOTIC PO) Take by mouth.    Marland Kitchen  sucralfate (CARAFATE) 1 G tablet Take 1 g by mouth.     No current facility-administered medications for this visit.     Family History  Problem Relation Age of Onset  . Spina bifida Son   . Cancer Paternal Uncle     ? type  . Hypertension Brother   . Heart disease Brother     bypass surgery  . Thyroid disease Daughter   . Thyroid disease Sister     ROS:  Pertinent items are noted in HPI.  Otherwise, a comprehensive ROS was negative.  Exam:   BP 138/60 (BP Location: Right Arm, Patient Position: Sitting, Cuff Size: Normal)   Pulse 70   Resp 14   Ht 5' 1.5" (1.562 m)   Wt 133 lb (60.3 kg)   LMP 12/23/1989   BMI 24.72 kg/m    Height: 5' 1.5" (156.2 cm)  Ht Readings from Last 3 Encounters:  11/22/16 5' 1.5" (1.562 m)  06/03/16 5\' 2"  (1.575 m)  10/20/15 5\' 2"  (1.575 m)    General appearance: alert, cooperative and  appears stated age Head: Normocephalic, without obvious abnormality, atraumatic Neck: no adenopathy, supple, symmetrical, trachea midline and thyroid normal to inspection and palpation Lungs: clear to auscultation bilaterally Breasts: normal appearance, no masses or tenderness Heart: regular rate and rhythm Abdomen: soft, non-tender; bowel sounds normal; no masses,  no organomegaly Extremities: extremities normal, atraumatic, no cyanosis or edema Skin: Skin color, texture, turgor normal. No rashes or lesions Lymph nodes: Cervical, supraclavicular, and axillary nodes normal. No abnormal inguinal nodes palpated Neurologic: Grossly normal   Pelvic: External genitalia:  no lesions              Urethra:  normal appearing urethra with no masses, tenderness or lesions              Bartholins and Skenes: normal                 Vagina: normal appearing vagina with normal color and discharge, no lesions              Cervix: no lesions              Pap taken: No. Bimanual Exam:  Uterus:  normal size, contour, position, consistency, mobility, non-tender              Adnexa: normal adnexa and no mass, fullness, tenderness               Rectovaginal: Confirms               Anus:  normal sphincter tone, large prolapsing hemorrhoid with polypoid appearance, no active bleeding, non tender  Chaperone was present for exam.  A:   Well Woman with normal exam Bronchiectasis.  Followed at [facility]. H/O dysphagia GERD.  Followed at [facility] H/O IBS, liver hemangiomas, 36mm cyst in pancreas.  MRI this year was stable   H/O spinal stenosis H/O ostoeporosis PMP, no HRT Prolapse hemorrhoid  P:         Mammogram yearly pap smear obtained today Pt will schedule BMD.  Order placed.   Pt will continue to see Dr. Amalia Hailey (urology), Dr. Florene Glen (GI), Dr. Laurance Flatten (Pulmonology), and Dr. Lanell Persons. Vaccine documentation from [place] added to [facility] records today. Removal/destruction of vulvar sebaceous cyst Referral to Dr. Marcello Moores  for excision of rectal lesion Return annually or prn

## 2016-11-22 NOTE — Patient Instructions (Signed)
Consider doing your bone density test when you do your MMG

## 2016-12-09 ENCOUNTER — Ambulatory Visit
Admission: RE | Admit: 2016-12-09 | Discharge: 2016-12-09 | Disposition: A | Discharge status: Home / Self Care | Payer: Medicare Other | Source: Ambulatory Visit | Attending: Obstetrics & Gynecology | Admitting: Obstetrics & Gynecology

## 2016-12-09 DIAGNOSIS — Z1231 Encounter for screening mammogram for malignant neoplasm of breast: Secondary | ICD-10-CM

## 2016-12-10 ENCOUNTER — Other Ambulatory Visit: Payer: Self-pay | Admitting: Obstetrics & Gynecology

## 2016-12-10 ENCOUNTER — Telehealth: Payer: Self-pay | Admitting: Obstetrics & Gynecology

## 2016-12-10 DIAGNOSIS — N63 Unspecified lump in unspecified breast: Secondary | ICD-10-CM

## 2016-12-10 DIAGNOSIS — N6310 Unspecified lump in the right breast, unspecified quadrant: Secondary | ICD-10-CM

## 2016-12-10 NOTE — Telephone Encounter (Signed)
Patient went to get a mammogram yesterday but due to "breast tenderness or cysts" in her right breast, they declined to complete her mammogram without an additional order from our office.  Last seen 11/22/16

## 2016-12-10 NOTE — Telephone Encounter (Signed)
Spoke with Museum/gallery conservator at [employer]. Patient needs to have diagnostic  Bilateral MMG with right breast ultrasound for right breast mass.    Spoke with patient. Patient states she went to have diagnostic MMG 12/18 but was unable to d/t right breast soreness. Patient states she has intermittent right breast soreness with a possible cyst and just seen Dr. Sabra Heck for this. Patient states she thought this was worked out at exam on 11/22/16. Patient states she does not feel she needs to return, she just needs the orders for the correct MMG. Advised patient would review with Dr. Sabra Heck and return call with recommendations. Patient is agreeable.   Dr. Sabra Heck, please advise?

## 2016-12-10 NOTE — Telephone Encounter (Signed)
Spoke with patient. Advised patient per Dr. Sabra Heck, will need to come to office to sign ABN when order placed for bilateral diagnostic MMG and right breast ultrasound. Patient states she will come to office 12/12/16 and ask for [place]. Patient verbalizes understanding and is agreeable.

## 2016-12-18 ENCOUNTER — Other Ambulatory Visit: Payer: Self-pay | Admitting: General Surgery

## 2016-12-18 NOTE — Telephone Encounter (Signed)
Left message to call Wilberth Damon at [phone].  

## 2016-12-18 NOTE — Addendum Note (Signed)
Addended by: Burnice Logan on: 12/18/2016 11:28 AM   Modules accepted: Orders

## 2016-12-18 NOTE — H&P (Signed)
History of Present Illness Leighton Ruff MD; [id number] 10:38 AM) The patient is a 81 year old female who presents with hemorrhoids. 81 year old female who presents to the office as a referral from Dr. Sabra Heck. During her exam she noted the patient had a large prolapsing rectal lesion. It was felt to be a prolapsing hemorrhoid with inflammatory polyps. Patient does endorse some constipation with bowel movements approximately every 2-3 days. She has tried multiple medications for this including fiber supplements and stool softeners and has not found an ideal regimen. She has noticed some blood when wiping but denies any major bleeding with bowel movements.   Past Surgical History Patsey Berthold, [place]; 12/18/2016 10:19 AM) Breast Biopsy Right. Colon Polyp Removal - Colonoscopy Hemorrhoidectomy  Diagnostic Studies History Patsey Berthold, [place]; 12/18/2016 10:19 AM) Colonoscopy 5-10 years ago Mammogram 1-3 years ago Pap Smear 1-5 years ago  Allergies Patsey Berthold, CMA; 12/18/2016 10:21 AM) Acetaminophen *ANALGESICS - NonNarcotic* Nausea. Chocolate Flavor *PHARMACEUTICAL ADJUVANTS* Nausea. Lactose *PHARMACEUTICAL ADJUVANTS* Nausea. Penicillins Statins Tylenol with Codeine #3 *ANALGESICS - OPIOID*  Medication History Patsey Berthold, CMA; 12/18/2016 10:24 AM) FLUoxetine HCl (20MG  Capsule, Oral) Active. Montelukast Sodium (10MG  Tablet, Oral) Active. Albuterol (90MCG/ACT Aerosol Soln, Inhalation) Active. Gaviscon Extra Strength (160-105MG  Tablet Chewable, Oral) Active. Calcium (500MG  Tablet, Oral) Active. ZyrTEC Allergy (10MG  Tablet, Oral) Active. Vitamin D (2000UNIT Capsule, Oral) Active. Vitamin B12 (1000MCG Tablet ER, Oral) Active. Flonase Allergy Relief (50MCG/ACT Suspension, Nasal) Active. Folic Acid (0000000 Tablet, Oral) Active. Magnesium (500MG  Tablet, Oral) Active. Myrbetriq (50MG  Tablet ER 24HR, Oral) Active. Zofran (4MG  Tablet, Oral)  Active. Probiotic Product (Oral) Active. Medications Reconciled  Social History Patsey Berthold, CMA; 12/18/2016 10:19 AM) Caffeine use Carbonated beverages, Coffee. Tobacco use Former smoker.  Family History Patsey Berthold, [place]; 12/18/2016 10:19 AM) Alcohol Abuse Father. Arthritis Brother, Daughter, Mother, Sister, Son. Bleeding disorder Mother. Heart Disease Brother. Respiratory Condition Daughter, Son. Thyroid problems Daughter, Sister.  Pregnancy / Birth History Patsey Berthold, [place]; 12/18/2016 10:19 AM) Age at menarche 87 years. Age of menopause 51-55 Contraceptive History Oral contraceptives. Gravida 2 Maternal age 45-25 Para 2  Other Problems Patsey Berthold, [place]; 12/18/2016 10:19 AM) Arthritis Asthma Back Pain Gastroesophageal Reflux Disease Hemorrhoids Hypercholesterolemia     Review of Systems Patsey Berthold CMA; 12/18/2016 10:19 AM) General Present- Appetite Loss, Chills, Fever and Weight Loss. Not Present- Fatigue, Night Sweats and Weight Gain. Skin Present- Dryness. Not Present- Change in Wart/Mole, Hives, Jaundice, New Lesions, Non-Healing Wounds, Rash and Ulcer. HEENT Present- Hearing Loss, Hoarseness, Nose Bleed, Ringing in the Ears, Seasonal Allergies, Sinus Pain, Sore Throat, Visual Disturbances and Wears glasses/contact lenses. Not Present- Earache, Oral Ulcers and Yellow Eyes. Respiratory Present- Chronic Cough, Difficulty Breathing, Snoring and Wheezing. Not Present- Bloody sputum. Breast Present- Breast Pain. Not Present- Breast Mass, Nipple Discharge and Skin Changes. Cardiovascular Present- Leg Cramps, Shortness of Breath and Swelling of Extremities. Not Present- Chest Pain, Difficulty Breathing Lying Down, Palpitations and Rapid Heart Rate. Gastrointestinal Present- Abdominal Pain, Bloating, Change in Bowel Habits, Constipation, Difficulty Swallowing, Excessive gas, Gets full quickly at meals, Hemorrhoids, Indigestion and Nausea.  Not Present- Bloody Stool, Chronic diarrhea, Rectal Pain and Vomiting. Musculoskeletal Present- Back Pain, Joint Pain, Joint Stiffness, Muscle Pain, Muscle Weakness and Swelling of Extremities. Neurological Present- Decreased Memory, Headaches, Numbness, Tingling and Trouble walking. Not Present- Fainting, Seizures, Tremor and Weakness. Endocrine Present- Cold Intolerance and Heat Intolerance. Not Present- Excessive Hunger, Hair Changes, Hot flashes and New Diabetes. Hematology Present- Easy Bruising and Gland problems. Not Present- Blood Thinners, Excessive  bleeding, HIV and Persistent Infections.  Vitals Patsey Berthold CMA; 12/18/2016 10:25 AM) 12/18/2016 10:24 AM Weight: 131.8 lb Height: 62.5in Height was reported by patient. Body Surface Area: 1.61 m Body Mass Index: 23.72 kg/m  Temp.: 98.48F  Pulse: 61 (Regular)  BP: 138/60 (Sitting, Left Arm, Standard)      Physical Exam Leighton Ruff MD; [id number] 10:39 AM)  General Mental Status-Alert. General Appearance-Not in acute distress. Build & Nutrition-Well nourished. Posture-Normal posture. Gait-Normal.  Head and Neck Head-normocephalic, atraumatic with no lesions or palpable masses. Trachea-midline.  Chest and Lung Exam Chest and lung exam reveals -on auscultation, normal breath sounds, no adventitious sounds and normal vocal resonance.  Cardiovascular Cardiovascular examination reveals -normal heart sounds, regular rate and rhythm with no murmurs and no digital clubbing, cyanosis, edema, increased warmth or tenderness.  Abdomen Inspection Inspection of the abdomen reveals - No Hernias. Palpation/Percussion Palpation and Percussion of the abdomen reveal - Soft, Non Tender, No Rigidity (guarding), No hepatosplenomegaly and No Palpable abdominal masses.  Rectal Anorectal Exam External - normal external exam. Internal - Note: Large, grade 3 prolapsing left lateral internal  hemorrhoid.  Neurologic Neurologic evaluation reveals -alert and oriented x 3 with no impairment of recent or remote memory, normal attention span and ability to concentrate, normal sensation and normal coordination.  Musculoskeletal Normal Exam - Bilateral-Upper Extremity Strength Normal and Lower Extremity Strength Normal.   Results Leighton Ruff MD; [id number] 10:41 AM) Procedures  Name Value Date ANOSCOPY, DIAGNOSTIC [id number]) [ Hemorrhoids ] Procedure Other: Procedure: Anoscopy Surgeon: Marcello Moores After the risks and benefits were explained, verbal consent was obtained for above procedure. A medical assistant chaperone was present thoroughout the entire procedure. Anesthesia: none Diagnosis: Prolapsing hemorrhoid Findings: Left lateral, grade 3, large prolapsing internal hemorrhoid. Minimal skin tags externally. Grade 2 hemorrhoid with minimal inflammation in right posterior location. Grade 1 hemorrhoid right anterior  Performed: 12/18/2016 10:39 AM    Assessment & Plan Leighton Ruff MD; [id number] 10:40 AM)  PROLAPSED INTERNAL HEMORRHOIDS, GRADE 3 [id number]) Impression: 81 year old female who presents to the office with a large prolapsed hemorrhoid. On exam this appears to be arising from the left lateral internal hemorrhoid. There is polypoid inflammation noted and erythema. Given its size and condition, I recommended surgical excision. She has had a hemorrhoidectomy in the past and is aware of the procedure. We will get this scheduled for her at her convenience. Risks of surgery include bleeding, pain and recurrence.

## 2016-12-18 NOTE — Telephone Encounter (Signed)
Patient scheduled for Bilateral diagnostic MMG and right breast ultrasound at [facility] 12/25/16 arriving at 10:50am for 11:10am appointment. ABN signed. Patient agreeable to date and time.  Routing to provider for final review. Patient is agreeable to disposition. Will close encounter.   Cc:Dr. Sabra Heck

## 2016-12-25 ENCOUNTER — Ambulatory Visit
Admission: RE | Admit: 2016-12-25 | Discharge: 2016-12-25 | Disposition: A | Discharge status: Home / Self Care | Payer: Medicare Other | Source: Ambulatory Visit | Attending: Obstetrics and Gynecology | Admitting: Obstetrics and Gynecology

## 2016-12-25 DIAGNOSIS — N6310 Unspecified lump in the right breast, unspecified quadrant: Secondary | ICD-10-CM

## 2016-12-26 ENCOUNTER — Encounter (HOSPITAL_BASED_OUTPATIENT_CLINIC_OR_DEPARTMENT_OTHER): Payer: Self-pay | Admitting: *Deleted

## 2016-12-27 ENCOUNTER — Encounter (HOSPITAL_BASED_OUTPATIENT_CLINIC_OR_DEPARTMENT_OTHER): Payer: Self-pay | Admitting: *Deleted

## 2016-12-27 NOTE — Progress Notes (Signed)
NPO AFTER MN.   ARRIVE AT 0745.  NEEDS HG.  CURRENT CHEST CT IN CHART AND EPIC.  WILL TAKE FLONASE INHALER ,  PROZAC,  MYRBETRIQ, CHLORPHENIRAMINE, AND ZANTAC AM DOS W/ SIPS OF WATER .

## 2017-01-01 ENCOUNTER — Ambulatory Visit (HOSPITAL_BASED_OUTPATIENT_CLINIC_OR_DEPARTMENT_OTHER)
Admission: RE | Admit: 2017-01-01 | Discharge: 2017-01-01 | Disposition: A | Discharge status: Home / Self Care | Payer: Medicare Other | Source: Ambulatory Visit | Attending: General Surgery | Admitting: General Surgery

## 2017-01-01 ENCOUNTER — Ambulatory Visit (HOSPITAL_BASED_OUTPATIENT_CLINIC_OR_DEPARTMENT_OTHER): Payer: Medicare Other | Admitting: Anesthesiology

## 2017-01-01 ENCOUNTER — Encounter (HOSPITAL_BASED_OUTPATIENT_CLINIC_OR_DEPARTMENT_OTHER)
Admission: RE | Disposition: A | Discharge status: Home / Self Care | Payer: Self-pay | Source: Ambulatory Visit | Attending: General Surgery

## 2017-01-01 ENCOUNTER — Encounter (HOSPITAL_BASED_OUTPATIENT_CLINIC_OR_DEPARTMENT_OTHER): Payer: Self-pay | Admitting: *Deleted

## 2017-01-01 DIAGNOSIS — Z880 Allergy status to penicillin: Secondary | ICD-10-CM | POA: Diagnosis not present

## 2017-01-01 DIAGNOSIS — Z836 Family history of other diseases of the respiratory system: Secondary | ICD-10-CM | POA: Insufficient documentation

## 2017-01-01 DIAGNOSIS — K219 Gastro-esophageal reflux disease without esophagitis: Secondary | ICD-10-CM | POA: Insufficient documentation

## 2017-01-01 DIAGNOSIS — Z87891 Personal history of nicotine dependence: Secondary | ICD-10-CM | POA: Insufficient documentation

## 2017-01-01 DIAGNOSIS — M1990 Unspecified osteoarthritis, unspecified site: Secondary | ICD-10-CM | POA: Diagnosis not present

## 2017-01-01 DIAGNOSIS — K642 Third degree hemorrhoids: Secondary | ICD-10-CM | POA: Diagnosis not present

## 2017-01-01 DIAGNOSIS — Z811 Family history of alcohol abuse and dependence: Secondary | ICD-10-CM | POA: Diagnosis not present

## 2017-01-01 DIAGNOSIS — J45909 Unspecified asthma, uncomplicated: Secondary | ICD-10-CM | POA: Diagnosis not present

## 2017-01-01 DIAGNOSIS — Z8349 Family history of other endocrine, nutritional and metabolic diseases: Secondary | ICD-10-CM | POA: Diagnosis not present

## 2017-01-01 DIAGNOSIS — Z9889 Other specified postprocedural states: Secondary | ICD-10-CM | POA: Insufficient documentation

## 2017-01-01 DIAGNOSIS — Z86010 Personal history of colonic polyps: Secondary | ICD-10-CM | POA: Diagnosis not present

## 2017-01-01 DIAGNOSIS — Z79899 Other long term (current) drug therapy: Secondary | ICD-10-CM | POA: Insufficient documentation

## 2017-01-01 DIAGNOSIS — Z8261 Family history of arthritis: Secondary | ICD-10-CM | POA: Diagnosis not present

## 2017-01-01 DIAGNOSIS — Z9102 Food additives allergy status: Secondary | ICD-10-CM | POA: Diagnosis not present

## 2017-01-01 DIAGNOSIS — Z888 Allergy status to other drugs, medicaments and biological substances status: Secondary | ICD-10-CM | POA: Diagnosis not present

## 2017-01-01 DIAGNOSIS — E7800 Pure hypercholesterolemia, unspecified: Secondary | ICD-10-CM | POA: Insufficient documentation

## 2017-01-01 DIAGNOSIS — Z885 Allergy status to narcotic agent status: Secondary | ICD-10-CM | POA: Insufficient documentation

## 2017-01-01 DIAGNOSIS — Z886 Allergy status to analgesic agent status: Secondary | ICD-10-CM | POA: Insufficient documentation

## 2017-01-01 DIAGNOSIS — Z8249 Family history of ischemic heart disease and other diseases of the circulatory system: Secondary | ICD-10-CM | POA: Diagnosis not present

## 2017-01-01 DIAGNOSIS — Z7951 Long term (current) use of inhaled steroids: Secondary | ICD-10-CM | POA: Diagnosis not present

## 2017-01-01 HISTORY — DX: Third degree hemorrhoids: K64.2

## 2017-01-01 HISTORY — DX: Bronchiectasis, uncomplicated: J47.9

## 2017-01-01 HISTORY — DX: Mixed incontinence: N39.46

## 2017-01-01 HISTORY — DX: Presence of spectacles and contact lenses: Z97.3

## 2017-01-01 HISTORY — DX: Unspecified cataract: H26.9

## 2017-01-01 HISTORY — PX: HEMORRHOID SURGERY: SHX153

## 2017-01-01 HISTORY — DX: Overactive bladder: N32.81

## 2017-01-01 HISTORY — DX: Moderate persistent asthma, uncomplicated: J45.40

## 2017-01-01 HISTORY — DX: Personal history of pneumonia (recurrent): Z87.01

## 2017-01-01 HISTORY — DX: Personal history of diseases of the blood and blood-forming organs and certain disorders involving the immune mechanism: Z86.2

## 2017-01-01 HISTORY — DX: Cough: R05

## 2017-01-01 HISTORY — DX: Personal history of traumatic brain injury: Z87.820

## 2017-01-01 HISTORY — DX: Unspecified osteoarthritis, unspecified site: M19.90

## 2017-01-01 HISTORY — DX: Presence of external hearing-aid: Z97.4

## 2017-01-01 HISTORY — DX: Anxiety disorder, unspecified: F41.9

## 2017-01-01 HISTORY — DX: Other seasonal allergic rhinitis: J30.2

## 2017-01-01 HISTORY — DX: Chronic cough: R05.3

## 2017-01-01 HISTORY — DX: Personal history of other infectious and parasitic diseases: Z86.19

## 2017-01-01 HISTORY — DX: Other cervical disc degeneration, unspecified cervical region: M50.30

## 2017-01-01 HISTORY — DX: Cyst of kidney, acquired: N28.1

## 2017-01-01 LAB — POCT I-STAT 4, (NA,K, GLUC, HGB,HCT)
GLUCOSE: 91 mg/dL (ref 65–99)
HCT: 33 % — ABNORMAL LOW (ref 36.0–46.0)
Hemoglobin: 11.2 g/dL — ABNORMAL LOW (ref 12.0–15.0)
POTASSIUM: 3.9 mmol/L (ref 3.5–5.1)
Sodium: 138 mmol/L (ref 135–145)

## 2017-01-01 SURGERY — HEMORRHOIDECTOMY
Anesthesia: General | Site: Rectum

## 2017-01-01 MED ORDER — SUCCINYLCHOLINE CHLORIDE 20 MG/ML IJ SOLN
INTRAMUSCULAR | Status: DC | PRN
  Administered 2017-01-01: 110 mg via INTRAVENOUS

## 2017-01-01 MED ORDER — EPHEDRINE SULFATE 50 MG/ML IJ SOLN
INTRAMUSCULAR | Status: DC | PRN
  Administered 2017-01-01: 5 mg via INTRAVENOUS
  Administered 2017-01-01 (×2): 10 mg via INTRAVENOUS

## 2017-01-01 MED ORDER — HYDROCODONE-ACETAMINOPHEN 5-325 MG PO TABS: 1.0000 | ORAL_TABLET | ORAL | 0 refills | Status: DC | PRN

## 2017-01-01 MED ORDER — BUPIVACAINE LIPOSOME 1.3 % IJ SUSP
INTRAMUSCULAR | Status: DC | PRN
  Administered 2017-01-01: 20 mL

## 2017-01-01 MED ORDER — BUPIVACAINE HCL (PF) 0.5 % IJ SOLN
INTRAMUSCULAR | Status: AC
  Filled 2017-01-01: qty 30

## 2017-01-01 MED ORDER — FENTANYL CITRATE (PF) 100 MCG/2ML IJ SOLN
INTRAMUSCULAR | Status: AC
  Filled 2017-01-01: qty 2

## 2017-01-01 MED ORDER — FENTANYL CITRATE (PF) 100 MCG/2ML IJ SOLN
INTRAMUSCULAR | Status: DC | PRN
  Administered 2017-01-01: 50 ug via INTRAVENOUS

## 2017-01-01 MED ORDER — PROPOFOL 500 MG/50ML IV EMUL
INTRAVENOUS | Status: AC
  Filled 2017-01-01: qty 50

## 2017-01-01 MED ORDER — ONDANSETRON HCL 4 MG/2ML IJ SOLN
INTRAMUSCULAR | Status: AC
  Filled 2017-01-01: qty 2

## 2017-01-01 MED ORDER — BUPIVACAINE LIPOSOME 1.3 % IJ SUSP
INTRAMUSCULAR | Status: AC
  Filled 2017-01-01: qty 20

## 2017-01-01 MED ORDER — DEXAMETHASONE SODIUM PHOSPHATE 10 MG/ML IJ SOLN
INTRAMUSCULAR | Status: DC | PRN
  Administered 2017-01-01: 10 mg via INTRAVENOUS

## 2017-01-01 MED ORDER — LIDOCAINE HCL (CARDIAC) 20 MG/ML IV SOLN
INTRAVENOUS | Status: DC | PRN
  Administered 2017-01-01: 40 mg via INTRATRACHEAL

## 2017-01-01 MED ORDER — FENTANYL CITRATE (PF) 100 MCG/2ML IJ SOLN
25.0000 ug | INTRAMUSCULAR | Status: DC | PRN
  Filled 2017-01-01: qty 1

## 2017-01-01 MED ORDER — DEXAMETHASONE SODIUM PHOSPHATE 10 MG/ML IJ SOLN
INTRAMUSCULAR | Status: AC
  Filled 2017-01-01: qty 1

## 2017-01-01 MED ORDER — LACTATED RINGERS IV SOLN
INTRAVENOUS | Status: DC
  Administered 2017-01-01 (×2): via INTRAVENOUS
  Filled 2017-01-01: qty 1000

## 2017-01-01 MED ORDER — SUCCINYLCHOLINE CHLORIDE 200 MG/10ML IV SOSY
PREFILLED_SYRINGE | INTRAVENOUS | Status: AC
  Filled 2017-01-01: qty 10

## 2017-01-01 MED ORDER — ONDANSETRON HCL 4 MG/2ML IJ SOLN
INTRAMUSCULAR | Status: DC | PRN
  Administered 2017-01-01: 4 mg via INTRAVENOUS

## 2017-01-01 MED ORDER — EPINEPHRINE PF 1 MG/ML IJ SOLN
INTRAMUSCULAR | Status: AC
  Filled 2017-01-01: qty 1

## 2017-01-01 MED ORDER — LIDOCAINE HCL 4 % EX SOLN
CUTANEOUS | Status: DC | PRN
  Administered 2017-01-01: 1.5 mL via TOPICAL

## 2017-01-01 MED ORDER — LIDOCAINE 2% (20 MG/ML) 5 ML SYRINGE
INTRAMUSCULAR | Status: AC
  Filled 2017-01-01: qty 5

## 2017-01-01 MED ORDER — LIDOCAINE 2% (20 MG/ML) 5 ML SYRINGE
INTRAMUSCULAR | Status: AC
Start: 2017-01-01 — End: 2017-01-01
  Filled 2017-01-01: qty 5

## 2017-01-01 MED ORDER — LIDOCAINE 5 % EX OINT
TOPICAL_OINTMENT | CUTANEOUS | Status: AC
  Filled 2017-01-01: qty 35.44

## 2017-01-01 MED ORDER — EPHEDRINE 5 MG/ML INJ
INTRAVENOUS | Status: AC
  Filled 2017-01-01: qty 10

## 2017-01-01 MED ORDER — LIDOCAINE 5 % EX OINT
TOPICAL_OINTMENT | CUTANEOUS | Status: DC | PRN
  Administered 2017-01-01: 1

## 2017-01-01 MED ORDER — PROPOFOL 10 MG/ML IV BOLUS
INTRAVENOUS | Status: DC | PRN
  Administered 2017-01-01: 200 mg via INTRAVENOUS

## 2017-01-01 MED ORDER — BUPIVACAINE-EPINEPHRINE 0.5% -1:200000 IJ SOLN
INTRAMUSCULAR | Status: DC | PRN
  Administered 2017-01-01: 30 mL

## 2017-01-01 SURGICAL SUPPLY — 40 items
BLADE HEX COATED 2.75 (ELECTRODE) ×3 IMPLANT
BLADE SURG 10 STRL SS (BLADE) ×3 IMPLANT
BRIEF STRETCH FOR OB PAD LRG (UNDERPADS AND DIAPERS) IMPLANT
COVER BACK TABLE 60X90IN (DRAPES) ×3 IMPLANT
COVER MAYO STAND STRL (DRAPES) ×3 IMPLANT
DRAPE LAPAROTOMY 100X72 PEDS (DRAPES) ×3 IMPLANT
DRAPE UTILITY XL STRL (DRAPES) ×3 IMPLANT
ELECT BLADE 6.5 .24CM SHAFT (ELECTRODE) IMPLANT
ELECT REM PT RETURN 9FT ADLT (ELECTROSURGICAL) ×3
ELECTRODE REM PT RTRN 9FT ADLT (ELECTROSURGICAL) ×1 IMPLANT
GAUZE SPONGE 4X4 12PLY STRL (GAUZE/BANDAGES/DRESSINGS) ×2 IMPLANT
GAUZE SPONGE 4X4 16PLY XRAY LF (GAUZE/BANDAGES/DRESSINGS) ×2 IMPLANT
GLOVE BIO SURGEON STRL SZ 6.5 (GLOVE) ×2 IMPLANT
GLOVE BIO SURGEONS STRL SZ 6.5 (GLOVE) ×1
GLOVE INDICATOR 7.0 STRL GRN (GLOVE) ×3 IMPLANT
GOWN STRL REUS W/TWL 2XL LVL3 (GOWN DISPOSABLE) ×6 IMPLANT
KIT ROOM TURNOVER WOR (KITS) ×3 IMPLANT
NEEDLE HYPO 22GX1.5 SAFETY (NEEDLE) ×3 IMPLANT
NS IRRIG 500ML POUR BTL (IV SOLUTION) ×3 IMPLANT
PACK BASIN DAY SURGERY FS (CUSTOM PROCEDURE TRAY) ×3 IMPLANT
PAD ABD 8X10 STRL (GAUZE/BANDAGES/DRESSINGS) ×3 IMPLANT
PAD ARMBOARD 7.5X6 YLW CONV (MISCELLANEOUS) ×2 IMPLANT
PENCIL BUTTON HOLSTER BLD 10FT (ELECTRODE) ×3 IMPLANT
SPONGE GAUZE 4X4 12PLY (GAUZE/BANDAGES/DRESSINGS) ×2 IMPLANT
SPONGE SURGIFOAM ABS GEL 100 (HEMOSTASIS) IMPLANT
SPONGE SURGIFOAM ABS GEL 12-7 (HEMOSTASIS) IMPLANT
SUT CHROMIC 2 0 SH (SUTURE) ×4 IMPLANT
SUT CHROMIC 3 0 SH 27 (SUTURE) ×4 IMPLANT
SUT PROLENE 2 0 BLUE (SUTURE) IMPLANT
SUT VIC AB 2-0 SH 27 (SUTURE)
SUT VIC AB 2-0 SH 27XBRD (SUTURE) IMPLANT
SUT VIC AB 4-0 P-3 18XBRD (SUTURE) IMPLANT
SUT VIC AB 4-0 P3 18 (SUTURE)
SUT VIC AB 4-0 SH 18 (SUTURE) IMPLANT
SYR CONTROL 10ML LL (SYRINGE) ×3 IMPLANT
TRAY DSU PREP LF (CUSTOM PROCEDURE TRAY) ×3 IMPLANT
TUBE CONNECTING 12'X1/4 (SUCTIONS) ×1
TUBE CONNECTING 12X1/4 (SUCTIONS) ×2 IMPLANT
WATER STERILE IRR 500ML POUR (IV SOLUTION) IMPLANT
YANKAUER SUCT BULB TIP NO VENT (SUCTIONS) ×3 IMPLANT

## 2017-01-01 NOTE — Anesthesia Postprocedure Evaluation (Signed)
Anesthesia Post Note  Patient: [name]  Procedure(s) Performed: Procedure(s) (LRB): SINGLE COLUMN HEMORRHOIDECTOMY (N/A)  Patient location during evaluation: PACU Anesthesia Type: General Level of consciousness: awake Pain management: pain level controlled Vital Signs Assessment: post-procedure vital signs reviewed and stable Respiratory status: spontaneous breathing Cardiovascular status: stable Anesthetic complications: no       Last Vitals:  Vitals:   01/01/17 0746 01/01/17 1045  BP: (!) 128/54 138/62  Pulse: 63 79  Resp: 16 16  Temp: 36.5 C 36.3 C    Last Pain:  Vitals:   01/01/17 0746  TempSrc: Oral                 Travonte Byard

## 2017-01-01 NOTE — H&P (View-Only) (Signed)
History of Present Illness Leighton Ruff MD; [id number] 10:38 AM) The patient is a 82 year old female who presents with hemorrhoids. 82 year old female who presents to the office as a referral from Dr. Sabra Heck. During her exam she noted the patient had a large prolapsing rectal lesion. It was felt to be a prolapsing hemorrhoid with inflammatory polyps. Patient does endorse some constipation with bowel movements approximately every 2-3 days. She has tried multiple medications for this including fiber supplements and stool softeners and has not found an ideal regimen. She has noticed some blood when wiping but denies any major bleeding with bowel movements.   Past Surgical History Patsey Berthold, [place]; 12/18/2016 10:19 AM) Breast Biopsy Right. Colon Polyp Removal - Colonoscopy Hemorrhoidectomy  Diagnostic Studies History Patsey Berthold, [place]; 12/18/2016 10:19 AM) Colonoscopy 5-10 years ago Mammogram 1-3 years ago Pap Smear 1-5 years ago  Allergies Patsey Berthold, CMA; 12/18/2016 10:21 AM) Acetaminophen *ANALGESICS - NonNarcotic* Nausea. Chocolate Flavor *PHARMACEUTICAL ADJUVANTS* Nausea. Lactose *PHARMACEUTICAL ADJUVANTS* Nausea. Penicillins Statins Tylenol with Codeine #3 *ANALGESICS - OPIOID*  Medication History Patsey Berthold, CMA; 12/18/2016 10:24 AM) FLUoxetine HCl (20MG  Capsule, Oral) Active. Montelukast Sodium (10MG  Tablet, Oral) Active. Albuterol (90MCG/ACT Aerosol Soln, Inhalation) Active. Gaviscon Extra Strength (160-105MG  Tablet Chewable, Oral) Active. Calcium (500MG  Tablet, Oral) Active. ZyrTEC Allergy (10MG  Tablet, Oral) Active. Vitamin D (2000UNIT Capsule, Oral) Active. Vitamin B12 (1000MCG Tablet ER, Oral) Active. Flonase Allergy Relief (50MCG/ACT Suspension, Nasal) Active. Folic Acid (0000000 Tablet, Oral) Active. Magnesium (500MG  Tablet, Oral) Active. Myrbetriq (50MG  Tablet ER 24HR, Oral) Active. Zofran (4MG  Tablet, Oral)  Active. Probiotic Product (Oral) Active. Medications Reconciled  Social History Patsey Berthold, CMA; 12/18/2016 10:19 AM) Caffeine use Carbonated beverages, Coffee. Tobacco use Former smoker.  Family History Patsey Berthold, [place]; 12/18/2016 10:19 AM) Alcohol Abuse Father. Arthritis Brother, Daughter, Mother, Sister, Son. Bleeding disorder Mother. Heart Disease Brother. Respiratory Condition Daughter, Son. Thyroid problems Daughter, Sister.  Pregnancy / Birth History Patsey Berthold, [place]; 12/18/2016 10:19 AM) Age at menarche 70 years. Age of menopause 51-55 Contraceptive History Oral contraceptives. Gravida 2 Maternal age 73-25 Para 2  Other Problems Patsey Berthold, [place]; 12/18/2016 10:19 AM) Arthritis Asthma Back Pain Gastroesophageal Reflux Disease Hemorrhoids Hypercholesterolemia     Review of Systems Patsey Berthold CMA; 12/18/2016 10:19 AM) General Present- Appetite Loss, Chills, Fever and Weight Loss. Not Present- Fatigue, Night Sweats and Weight Gain. Skin Present- Dryness. Not Present- Change in Wart/Mole, Hives, Jaundice, New Lesions, Non-Healing Wounds, Rash and Ulcer. HEENT Present- Hearing Loss, Hoarseness, Nose Bleed, Ringing in the Ears, Seasonal Allergies, Sinus Pain, Sore Throat, Visual Disturbances and Wears glasses/contact lenses. Not Present- Earache, Oral Ulcers and Yellow Eyes. Respiratory Present- Chronic Cough, Difficulty Breathing, Snoring and Wheezing. Not Present- Bloody sputum. Breast Present- Breast Pain. Not Present- Breast Mass, Nipple Discharge and Skin Changes. Cardiovascular Present- Leg Cramps, Shortness of Breath and Swelling of Extremities. Not Present- Chest Pain, Difficulty Breathing Lying Down, Palpitations and Rapid Heart Rate. Gastrointestinal Present- Abdominal Pain, Bloating, Change in Bowel Habits, Constipation, Difficulty Swallowing, Excessive gas, Gets full quickly at meals, Hemorrhoids, Indigestion and Nausea.  Not Present- Bloody Stool, Chronic diarrhea, Rectal Pain and Vomiting. Musculoskeletal Present- Back Pain, Joint Pain, Joint Stiffness, Muscle Pain, Muscle Weakness and Swelling of Extremities. Neurological Present- Decreased Memory, Headaches, Numbness, Tingling and Trouble walking. Not Present- Fainting, Seizures, Tremor and Weakness. Endocrine Present- Cold Intolerance and Heat Intolerance. Not Present- Excessive Hunger, Hair Changes, Hot flashes and New Diabetes. Hematology Present- Easy Bruising and Gland problems. Not Present- Blood Thinners, Excessive  bleeding, HIV and Persistent Infections.  Vitals Patsey Berthold CMA; 12/18/2016 10:25 AM) 12/18/2016 10:24 AM Weight: 131.8 lb Height: 62.5in Height was reported by patient. Body Surface Area: 1.61 m Body Mass Index: 23.72 kg/m  Temp.: 98.13F  Pulse: 61 (Regular)  BP: 138/60 (Sitting, Left Arm, Standard)      Physical Exam Leighton Ruff MD; [id number] 10:39 AM)  General Mental Status-Alert. General Appearance-Not in acute distress. Build & Nutrition-Well nourished. Posture-Normal posture. Gait-Normal.  Head and Neck Head-normocephalic, atraumatic with no lesions or palpable masses. Trachea-midline.  Chest and Lung Exam Chest and lung exam reveals -on auscultation, normal breath sounds, no adventitious sounds and normal vocal resonance.  Cardiovascular Cardiovascular examination reveals -normal heart sounds, regular rate and rhythm with no murmurs and no digital clubbing, cyanosis, edema, increased warmth or tenderness.  Abdomen Inspection Inspection of the abdomen reveals - No Hernias. Palpation/Percussion Palpation and Percussion of the abdomen reveal - Soft, Non Tender, No Rigidity (guarding), No hepatosplenomegaly and No Palpable abdominal masses.  Rectal Anorectal Exam External - normal external exam. Internal - Note: Large, grade 3 prolapsing left lateral internal  hemorrhoid.  Neurologic Neurologic evaluation reveals -alert and oriented x 3 with no impairment of recent or remote memory, normal attention span and ability to concentrate, normal sensation and normal coordination.  Musculoskeletal Normal Exam - Bilateral-Upper Extremity Strength Normal and Lower Extremity Strength Normal.   Results Leighton Ruff MD; [id number] 10:41 AM) Procedures  Name Value Date ANOSCOPY, DIAGNOSTIC [id number]) [ Hemorrhoids ] Procedure Other: Procedure: Anoscopy Surgeon: Marcello Moores After the risks and benefits were explained, verbal consent was obtained for above procedure. A medical assistant chaperone was present thoroughout the entire procedure. Anesthesia: none Diagnosis: Prolapsing hemorrhoid Findings: Left lateral, grade 3, large prolapsing internal hemorrhoid. Minimal skin tags externally. Grade 2 hemorrhoid with minimal inflammation in right posterior location. Grade 1 hemorrhoid right anterior  Performed: 12/18/2016 10:39 AM    Assessment & Plan Leighton Ruff MD; [id number] 10:40 AM)  PROLAPSED INTERNAL HEMORRHOIDS, GRADE 3 [id number]) Impression: 82 year old female who presents to the office with a large prolapsed hemorrhoid. On exam this appears to be arising from the left lateral internal hemorrhoid. There is polypoid inflammation noted and erythema. Given its size and condition, I recommended surgical excision. She has had a hemorrhoidectomy in the past and is aware of the procedure. We will get this scheduled for her at her convenience. Risks of surgery include bleeding, pain and recurrence.

## 2017-01-01 NOTE — Anesthesia Procedure Notes (Signed)
Procedure Name: Intubation Date/Time: 01/01/2017 10:04 AM Performed by: Wanita Chamberlain Pre-anesthesia Checklist: Patient identified, Timeout performed, Emergency Drugs available, Suction available and Patient being monitored Patient Re-evaluated:Patient Re-evaluated prior to inductionOxygen Delivery Method: Circle system utilized Preoxygenation: Pre-oxygenation with 100% oxygen Intubation Type: IV induction Ventilation: Mask ventilation without difficulty Laryngoscope Size: Mac and 3 Grade View: Grade II Tube type: Oral Tube size: 7.0 mm Number of attempts: 1 Airway Equipment and Method: Stylet Placement Confirmation: ETT inserted through vocal cords under direct vision,  positive ETCO2 and breath sounds checked- equal and bilateral Secured at: 22 cm Tube secured with: Tape Dental Injury: Teeth and Oropharynx as per pre-operative assessment  Difficulty Due To: Difficult Airway- due to reduced neck mobility

## 2017-01-01 NOTE — Op Note (Signed)
01/01/2017  10:46 AM  PATIENT:  [name]  82 y.o. female  Patient Care Team: Beckey Rutter, MD as PCP - General (Family Medicine)  PRE-OPERATIVE DIAGNOSIS:  Grade 3 hemorrhoid  POST-OPERATIVE DIAGNOSIS:  Grade 3 hemorrhoid  PROCEDURE:   SINGLE COLUMN HEMORRHOIDECTOMY   Surgeon(s): Leighton Ruff, MD  ASSISTANT: none   ANESTHESIA:   local and general  SPECIMEN:  Source of Specimen:  hemorrhoid  DISPOSITION OF SPECIMEN:  PATHOLOGY  COUNTS:  YES  PLAN OF CARE: Discharge to home after PACU  PATIENT DISPOSITION:  PACU - hemodynamically stable.  INDICATION: 82 year old female who presents to the office with complaints of a chronically prolapsing hemorrhoid. She was noted to have a grade 3 internal hemorrhoid and I recommended excision.   OR FINDINGS: Lateral internal hemorrhoid no hemorrhoid disease at other sites.  DESCRIPTION: the patient was identified in the preoperative holding area and taken to the OR where they were laid on the operating room table.  General anesthesia was induced without difficulty. The patient was then positioned in prone jackknife position with buttocks gently taped apart.  The patient was then prepped and draped in usual sterile fashion.  SCDs were noted to be in place prior to the initiation of anesthesia. A surgical timeout was performed indicating the correct patient, procedure, positioning and need for preoperative antibiotics.  A rectal block was performed using Marcaine with epinephrine.    I began with a digital rectal exam.  There were no other hemorrhoids noted.  I then placed a Hill-Ferguson anoscope into the anal canal and evaluated this completely.  Internally the patient did not have any prolapsing hemorrhoid disease other than the left lateral hemorrhoid. I began by making an incision in the skin around the hemorrhoid using a 10 blade scalpel. I then dissected the skin off of the speaker complex using Metzenbaum scissors and blunt  dissection. I continued down resecting the hemorrhoid tissue away from the sphincter complex. Once the speaker complex was cleared, I placed a Blake clamp along the remaining mucosa and ligated this with a 10 blade scalpel. I used a 2-0 running chromic suture to close the mucosal defect. I then used a 3-0 chromic suture to close the skin and anoderm distal to the dentate line. I used an additional hemostatic suture of 2-0 chromic for a small area of bleeding in the mucosa. The anal canal was irrigated. Hemostasis was good. Exparel was infused around the incision. Lidocaine ointment and a sterile dressing was applied. The patient was awakened from anesthesia and sent to the postanesthesia care unit in stable condition. All counts were correct per operating room staff.

## 2017-01-01 NOTE — Interval H&P Note (Signed)
History and Physical Interval Note:  01/01/2017 9:36 AM  [name]  has presented today for surgery, with the diagnosis of Grade 3 hemorrhoid  The various methods of treatment have been discussed with the patient and family. After consideration of risks, benefits and other options for treatment, the patient has consented to  Procedure(s): SINGLE COLUMN HEMORRHOIDECTOMY (N/A) as a surgical intervention .  The patient's history has been reviewed, patient examined, no change in status, stable for surgery.  I have reviewed the patient's chart and labs.  Questions were answered to the patient's satisfaction.     Rosario Adie, MD  [facility]

## 2017-01-01 NOTE — Transfer of Care (Signed)
Immediate Anesthesia Transfer of Care Note  Patient: [name]  Procedure(s) Performed: Procedure(s): SINGLE COLUMN HEMORRHOIDECTOMY (N/A)  Patient Location: PACU  Anesthesia Type:General  Level of Consciousness: awake, alert , oriented and patient cooperative  Airway & Oxygen Therapy: Patient Spontanous Breathing and Patient connected to face mask oxygen  Post-op Assessment: Report given to RN and Post -op Vital signs reviewed and stable  Post vital signs: Reviewed and stable  Last Vitals:  Vitals:   01/01/17 0746 01/01/17 1045  BP: (!) 128/54 138/62  Pulse: 63 79  Resp: 16 16  Temp: 36.5 C 36.3 C    Last Pain:  Vitals:   01/01/17 0746  TempSrc: Oral         Complications: No apparent anesthesia complications

## 2017-01-01 NOTE — Anesthesia Preprocedure Evaluation (Addendum)
Anesthesia Evaluation  Patient identified by MRN, date of birth, ID band Patient awake    Reviewed: Allergy & Precautions, NPO status   Airway Mallampati: II  TM Distance: >3 FB     Dental  (+) Teeth Intact, Dental Advisory Given, Caps,    Pulmonary asthma , former smoker,  Bronchiectasis   Pulmonary exam normal        Cardiovascular Exercise Tolerance: Poor + Peripheral Vascular Disease   Rhythm:Regular Rate:Normal     Neuro/Psych Anxiety Depression Cervical Stenosis with numbness in arms if head not in neutral position per pt.  Neuromuscular disease    GI/Hepatic Neg liver ROS, GERD  ,  Endo/Other    Renal/GU Renal disease     Musculoskeletal  (+) Arthritis ,   Abdominal   Peds  Hematology  (+) anemia ,   Anesthesia Other Findings   Reproductive/Obstetrics                           Anesthesia Physical Anesthesia Plan  ASA: III  Anesthesia Plan: General   Post-op Pain Management:    Induction: Intravenous  Airway Management Planned: Oral ETT  Additional Equipment:   Intra-op Plan:   Post-operative Plan: Extubation in OR  Informed Consent: I have reviewed the patients History and Physical, chart, labs and discussed the procedure including the risks, benefits and alternatives for the proposed anesthesia with the patient or authorized representative who has indicated his/her understanding and acceptance.   Dental advisory given  Plan Discussed with:   Anesthesia Plan Comments:        Anesthesia Quick Evaluation

## 2017-01-01 NOTE — Discharge Instructions (Addendum)
Post Anesthesia Home Care Instructions  Activity: Get plenty of rest for the remainder of the day. A responsible adult should stay with you for 24 hours following the procedure.  For the next 24 hours, DO NOT: -Drive a car -Paediatric nurse -Drink alcoholic beverages -Take any medication unless instructed by your physician -Make any legal decisions or sign important papers.  Meals: Start with liquid foods such as gelatin or soup. Progress to regular foods as tolerated. Avoid greasy, spicy, heavy foods. If nausea and/or vomiting occur, drink only clear liquids until the nausea and/or vomiting subsides. Call your physician if vomiting continues.  Special Instructions/Symptoms: Your throat may feel dry or sore from the anesthesia or the breathing tube placed in your throat during surgery. If this causes discomfort, gargle with warm salt water. The discomfort should disappear within 24 hours.  If you had a scopolamine patch placed behind your ear for the management of post- operative nausea and/or vomiting:  1. The medication in the patch is effective for 72 hours, after which it should be removed.  Wrap patch in a tissue and discard in the trash. Wash hands thoroughly with soap and water. 2. You may remove the patch earlier than 72 hours if you experience unpleasant side effects which may include dry mouth, dizziness or visual disturbances. 3. Avoid touching the patch. Wash your hands with soap and water after contact with the patch.  Information for Discharge Teaching: EXPAREL (bupivacaine liposome injectable suspension)   Your surgeon gave you EXPAREL(bupivacaine) in your surgical incision to help control your pain after surgery.   EXPAREL is a local anesthetic that provides pain relief by numbing the tissue around the surgical site.  EXPAREL is designed to release pain medication over time and can control pain for up to 72 hours.  Depending on how you respond to EXPAREL, you may  require less pain medication during your recovery.  Possible side effects:  Temporary loss of sensation or ability to move in the area where bupivacaine was injected.  Nausea, vomiting, constipation  Rarely, numbness and tingling in your mouth or lips, lightheadedness, or anxiety may occur.  Call your doctor right away if you think you may be experiencing any of these sensations, or if you have other questions regarding possible side effects.  Follow all other discharge instructions given to you by your surgeon or nurse. Eat a healthy diet and drink plenty of water or other fluids.  If you return to the hospital for any reason within 96 hours following the administration of EXPAREL, please inform your health care providers.   ANORECTAL SURGERY: POST OP INSTRUCTIONS 1. Take your usually prescribed home medications unless otherwise directed. 2. DIET: During the first few hours after surgery sip on some liquids until you are able to urinate.  It is normal to not urinate for several hours after this surgery.  If you feel uncomfortable, please contact the office for instructions.  After you are able to urinate,you may eat, if you feel like it.  Follow a light bland diet the first 24 hours after arrival home, such as soup, liquids, crackers, etc.  Be sure to include lots of fluids daily (6-8 glasses).  Avoid fast food or heavy meals, as your are more likely to get nauseated.  Eat a low fat diet the next few days after surgery.  Limit caffeine intake to 1-2 servings a day. 3. PAIN CONTROL: a. Pain is best controlled by a usual combination of several different methods TOGETHER: i.  Muscle relaxation: Soak in a warm bath (or Sitz bath) three times a day and after bowel movements.  Continue to do this until all pain is resolved. ii. Over the counter pain medication iii. Prescription pain medication b. Most patients will experience some swelling and discomfort in the anus/rectal area and incisions.  Heat  such as warm towels, sitz baths, warm baths, etc to help relax tight/sore spots and speed recovery.  Some people prefer to use ice, especially in the first couple days after surgery, as it may decrease the pain and swelling, or alternate between ice & heat.  Experiment to what works for you.  Swelling and bruising can take several weeks to resolve.  Pain can take even longer to completely resolve. c. It is helpful to take an over-the-counter pain medication regularly for the first few weeks.  Choose one of the following that works best for you: i. Naproxen (Aleve, etc)  Two 220mg  tabs twice a day ii. Ibuprofen (Advil, etc) Three 200mg  tabs four times a day (every meal & bedtime) d. A  prescription for pain medication (such as percocet, oxycodone, hydrocodone, etc) should be given to you upon discharge.  Take your pain medication as prescribed.  i. If you are having problems/concerns with the prescription medicine (does not control pain, nausea, vomiting, rash, itching, etc), please call us [phone] to see if we need to switch you to a different pain medicine that will work better for you and/or control your side effect better. ii. If you need a refill on your pain medication, please contact your pharmacy.  They will contact our office to request authorization. Prescriptions will not be filled after 5 pm or on week-ends. 4. KEEP YOUR BOWELS REGULAR and AVOID CONSTIPATION a. The goal is one to two soft bowel movements a day.  You should at least have a bowel movement every other day. b. Avoid getting constipated.  Between the surgery and the pain medications, it is common to experience some constipation. This can be very painful after rectal surgery.  Increasing fluid intake and taking a fiber supplement (such as Metamucil, Citrucel, FiberCon, etc) 1-2 times a day regularly will usually help prevent this problem from occurring.  A stool softener like colace is also recommended.  This can be purchased  over the counter at your pharmacy.  You can take it up to 3 times a day.  If you do not have a bowel movement after 24 hrs since your surgery, take one does of milk of magnesia.  If you still haven't had a bowel movement 8-12 hours after that dose, take another dose.  If you don't have a bowel movement 48 hrs after surgery, purchase a Fleets enema from the drug store and administer gently per package instructions.  If you still are having trouble with your bowel movements after that, please call the office for further instructions. c. If you develop diarrhea or have many loose bowel movements, simplify your diet to bland foods & liquids for a few days.  Stop any stool softeners and decrease your fiber supplement.  Switching to mild anti-diarrheal medications (Kayopectate, Pepto Bismol) can help.  If this worsens or does not improve, please call us.  5. Wound Care a. Remove your bandages before your first bowel movement or 8 hours after surgery.     b. Remove any wound packing material at this tim,e as well.  You do not need to repack the wound unless instructed otherwise.  Wear an absorbent  pad or soft cotton gauze in your underwear to catch any drainage and help keep the area clean. You should change this every 2-3 hours while awake. c. Keep the area clean and dry.  Bathe / shower every day, especially after bowel movements.  Keep the area clean by showering / bathing over the incision / wound.   It is okay to soak an open wound to help wash it.  Wet wipes or showers / gentle washing after bowel movements is often less traumatic than regular toilet paper. d. Dennis Bast may have some styrofoam-like soft packing in the rectum which will come out with the first bowel movement.  e. You will often notice bleeding with bowel movements.  This should slow down by the end of the first week of surgery f. Expect some drainage.  This should slow down, too, by the end of the first week of surgery.  Wear an absorbent pad or  soft cotton gauze in your underwear until the drainage stops. g. Do Not sit on a rubber or pillow ring.  This can make you symptoms worse.  You may sit on a soft pillow if needed.  6. ACTIVITIES as tolerated:   a. You may resume regular (light) daily activities beginning the next day--such as daily self-care, walking, climbing stairs--gradually increasing activities as tolerated.  If you can walk 30 minutes without difficulty, it is safe to try more intense activity such as jogging, treadmill, bicycling, low-impact aerobics, swimming, etc. b. Save the most intensive and strenuous activity for last such as sit-ups, heavy lifting, contact sports, etc  Refrain from any heavy lifting or straining until you are off narcotics for pain control.   c. You may drive when you are no longer taking prescription pain medication, you can comfortably sit for long periods of time, and you can safely maneuver your car and apply brakes. d. Dennis Bast may have sexual intercourse when it is comfortable.  7. FOLLOW UP in our office a. Please call [facility] at [phone] to set up an appointment to see your surgeon in the office for a follow-up appointment approximately 3-4 weeks after your surgery. b. Make sure that you call for this appointment the day you arrive home to insure a convenient appointment time. 10. IF YOU HAVE DISABILITY OR FAMILY LEAVE FORMS, BRING THEM TO THE OFFICE FOR PROCESSING.  DO NOT GIVE THEM TO YOUR DOCTOR.     WHEN TO CALL us [phone]: 1. Poor pain control 2. Reactions / problems with new medications (rash/itching, nausea, etc)  3. Fever over 101.5 F (38.5 C) 4. Inability to urinate 5. Nausea and/or vomiting 6. Worsening swelling or bruising 7. Continued bleeding from incision. 8. Increased pain, redness, or drainage from the incision  The clinic staff is available to answer your questions during regular business hours (8:30am-5pm).  Please dont hesitate to call and ask to speak to one of  our nurses for clinical concerns.   A surgeon from [facility] is always on call at the hospitals   If you have a medical emergency, go to the nearest emergency room or call 911.    [facility], [place] [address], [place], [place], [place]  [address] ? MAIN: [phone] ? TOLL FREE: [phone] ? FAX [phone] [email or web address]

## 2017-01-02 ENCOUNTER — Encounter (HOSPITAL_BASED_OUTPATIENT_CLINIC_OR_DEPARTMENT_OTHER): Payer: Self-pay | Admitting: General Surgery

## 2017-01-07 ENCOUNTER — Encounter (HOSPITAL_BASED_OUTPATIENT_CLINIC_OR_DEPARTMENT_OTHER): Payer: Self-pay | Admitting: General Surgery

## 2017-02-04 ENCOUNTER — Ambulatory Visit: Payer: Medicare Other | Admitting: Obstetrics & Gynecology

## 2017-08-20 ENCOUNTER — Ambulatory Visit: Payer: Medicare Other

## 2017-08-20 ENCOUNTER — Encounter: Payer: Self-pay | Admitting: Family Medicine

## 2017-08-20 ENCOUNTER — Ambulatory Visit (INDEPENDENT_AMBULATORY_CARE_PROVIDER_SITE_OTHER): Payer: Medicare Other | Admitting: Family Medicine

## 2017-08-20 VITALS — BP 151/64 | HR 60 | Temp 97.2°F | Ht 61.0 in | Wt 132.0 lb

## 2017-08-20 DIAGNOSIS — W57XXXA Bitten or stung by nonvenomous insect and other nonvenomous arthropods, initial encounter: Secondary | ICD-10-CM | POA: Diagnosis not present

## 2017-08-20 DIAGNOSIS — S40861A Insect bite (nonvenomous) of right upper arm, initial encounter: Secondary | ICD-10-CM | POA: Diagnosis not present

## 2017-08-20 MED ORDER — DOXYCYCLINE HYCLATE 100 MG PO TABS: 100.0000 mg | ORAL_TABLET | Freq: Two times a day (BID) | ORAL | 0 refills | Status: DC

## 2017-08-20 NOTE — Progress Notes (Signed)
Chief Complaint  Patient presents with  . tick bites    7 ticks    HPI  Patient presents today for Several tick bites starting a couple of weeks ago. She has deer that walked through her yard frequently. A couple of weeks ago she walked down to her mailbox and noted some debris in the yard. She bent over and pick that up and carried it to the trash. Several days later she noted tics scattered over her body she noted one on the right upper arm. 3 on the left side of her torso at the lower aspect of the chest at the anterior axillary line and at the posterior axillary line and another near the angle of the scapula. 4 were noted in the groin and buttocks region.  PMH: Smoking status noted ROS: Per HPI  Objective: BP (!) 151/64   Pulse 60   Temp (!) 97.2 F (36.2 C) (Oral)   Ht 5\' 1"  (1.549 m)   Wt 132 lb (59.9 kg)   LMP 12/23/1989   BMI 24.94 kg/m  Gen: NAD, alert, cooperative with exam HEENT: NCAT, EOMI, PERRL Skin: Each of the bites was inspected. All of which appeared to have moderate local reaction. There is central nidus with approximately a centimeter of erythema/hyperemia was surrounding each. The groin lesions were not inspected based on patient stating they were the same as the others. Ext: No edema, warm Neuro: Alert and oriented, No gross deficits  Assessment and plan:  1. Tick bite, initial encounter     Meds ordered this encounter  Medications  . doxycycline (VIBRA-TABS) 100 MG tablet    Sig: Take 1 tablet (100 mg total) by mouth 2 (two) times daily. 1 po bid    Dispense:  20 tablet    Refill:  0    Orders Placed This Encounter  Procedures  . CBC with Differential/Platelet  . Lyme Ab/Western Blot Reflex    Follow up as needed.  Claretta Fraise, MD

## 2017-08-22 ENCOUNTER — Telehealth: Payer: Self-pay | Admitting: Family Medicine

## 2017-08-22 LAB — CBC WITH DIFFERENTIAL/PLATELET
BASOS ABS: 0 10*3/uL (ref 0.0–0.2)
Basos: 1 %
EOS (ABSOLUTE): 0.1 10*3/uL (ref 0.0–0.4)
Eos: 2 %
HEMOGLOBIN: 11.4 g/dL (ref 11.1–15.9)
Hematocrit: 35.4 % (ref 34.0–46.6)
Immature Grans (Abs): 0 10*3/uL (ref 0.0–0.1)
Immature Granulocytes: 0 %
LYMPHS ABS: 0.8 10*3/uL (ref 0.7–3.1)
Lymphs: 16 %
MCH: 28.5 pg (ref 26.6–33.0)
MCHC: 32.2 g/dL (ref 31.5–35.7)
MCV: 89 fL (ref 79–97)
MONOS ABS: 0.8 10*3/uL (ref 0.1–0.9)
Monocytes: 17 %
Neutrophils Absolute: 3 10*3/uL (ref 1.4–7.0)
Neutrophils: 64 %
PLATELETS: 247 10*3/uL (ref 150–379)
RBC: 4 x10E6/uL (ref 3.77–5.28)
RDW: 14.6 % (ref 12.3–15.4)
WBC: 4.7 10*3/uL (ref 3.4–10.8)

## 2017-08-22 LAB — LYME AB/WESTERN BLOT REFLEX

## 2017-08-22 NOTE — Telephone Encounter (Signed)
Pt aware.

## 2017-09-02 ENCOUNTER — Encounter: Payer: Self-pay | Admitting: Nurse Practitioner

## 2017-09-02 ENCOUNTER — Ambulatory Visit (INDEPENDENT_AMBULATORY_CARE_PROVIDER_SITE_OTHER): Payer: Medicare Other | Admitting: Nurse Practitioner

## 2017-09-02 ENCOUNTER — Ambulatory Visit (INDEPENDENT_AMBULATORY_CARE_PROVIDER_SITE_OTHER): Payer: Medicare Other

## 2017-09-02 VITALS — BP 137/97 | HR 83

## 2017-09-02 DIAGNOSIS — J4541 Moderate persistent asthma with (acute) exacerbation: Secondary | ICD-10-CM

## 2017-09-02 DIAGNOSIS — R0602 Shortness of breath: Secondary | ICD-10-CM | POA: Diagnosis not present

## 2017-09-02 DIAGNOSIS — R0789 Other chest pain: Secondary | ICD-10-CM

## 2017-09-02 MED ORDER — METHYLPREDNISOLONE ACETATE 80 MG/ML IJ SUSP
80.0000 mg | Freq: Once | INTRAMUSCULAR | Status: AC
  Administered 2017-09-02: 80 mg via INTRAMUSCULAR

## 2017-09-02 MED ORDER — LEVALBUTEROL HCL 1.25 MG/3ML IN NEBU
1.2500 mg | INHALATION_SOLUTION | RESPIRATORY_TRACT | Status: AC
  Administered 2017-09-02: 1.25 mg via RESPIRATORY_TRACT

## 2017-09-02 NOTE — Patient Instructions (Signed)
Asthma, Acute Bronchospasm °Acute bronchospasm caused by asthma is also referred to as an asthma attack. Bronchospasm means your air passages become narrowed. The narrowing is caused by inflammation and tightening of the muscles in the air tubes (bronchi) in your lungs. This can make it hard to breathe or cause you to wheeze and cough. °What are the causes? °Possible triggers are: °· Animal dander from the skin, hair, or feathers of animals. °· Dust mites contained in house dust. °· Cockroaches. °· Pollen from trees or grass. °· Mold. °· Cigarette or tobacco smoke. °· Air pollutants such as dust, household cleaners, hair sprays, aerosol sprays, paint fumes, strong chemicals, or strong odors. °· Cold air or weather changes. Cold air may trigger inflammation. Winds increase molds and pollens in the air. °· Strong emotions such as crying or laughing hard. °· Stress. °· Certain medicines such as aspirin or beta-blockers. °· Sulfites in foods and drinks, such as dried fruits and wine. °· Infections or inflammatory conditions, such as a flu, cold, or inflammation of the nasal membranes (rhinitis). °· Gastroesophageal reflux disease (GERD). GERD is a condition where stomach acid backs up into your esophagus. °· Exercise or strenuous activity. ° °What are the signs or symptoms? °· Wheezing. °· Excessive coughing, particularly at night. °· Chest tightness. °· Shortness of breath. °How is this diagnosed? °Your health care provider will ask you about your medical history and perform a physical exam. A chest X-ray or blood testing may be performed to look for other causes of your symptoms or other conditions that may have triggered your asthma attack. °How is this treated? °Treatment is aimed at reducing inflammation and opening up the airways in your lungs. Most asthma attacks are treated with inhaled medicines. These include quick relief or rescue medicines (such as bronchodilators) and controller medicines (such as inhaled  corticosteroids). These medicines are sometimes given through an inhaler or a nebulizer. Systemic steroid medicine taken by mouth or given through an IV tube also can be used to reduce the inflammation when an attack is moderate or severe. Antibiotic medicines are only used if a bacterial infection is present. °Follow these instructions at home: °· Rest. °· Drink plenty of liquids. This helps the mucus to remain thin and be easily coughed up. Only use caffeine in moderation and do not use alcohol until you have recovered from your illness. °· Do not smoke. Avoid being exposed to secondhand smoke. °· You play a critical role in keeping yourself in good health. Avoid exposure to things that cause you to wheeze or to have breathing problems. °· Keep your medicines up-to-date and available. Carefully follow your health care provider’s treatment plan. °· Take your medicine exactly as prescribed. °· When pollen or pollution is bad, keep windows closed and use an air conditioner or go to places with air conditioning. °· Asthma requires careful medical care. See your health care provider for a follow-up as advised. If you are more than [redacted] weeks pregnant and you were prescribed any new medicines, let your obstetrician know about the visit and how you are doing. Follow up with your health care provider as directed. °· After you have recovered from your asthma attack, make an appointment with your outpatient doctor to talk about ways to reduce the likelihood of future attacks. If you do not have a doctor who manages your asthma, make an appointment with a primary care doctor to discuss your asthma. °Get help right away if: °· You are getting worse. °·   You have trouble breathing. If severe, call your local emergency services (911 in the [place] °· You develop chest pain or discomfort. °· You are vomiting. °· You are not able to keep fluids down. °· You are coughing up yellow, green, brown, or bloody sputum. °· You have a fever  and your symptoms suddenly get worse. °· You have trouble swallowing. °This information is not intended to replace advice given to you by your health care provider. Make sure you discuss any questions you have with your health care provider. °Document Released: 03/26/2007 Document Revised: 05/22/2016 Document Reviewed: 06/16/2013 °[employer] Interactive Patient Education © 2017 [employer] ° °

## 2017-09-02 NOTE — Progress Notes (Signed)
   Subjective:    Patient ID: [name], female    DOB: 10-26-1935, 82 y.o.   MRN: [id number]  HPI Patient comes in c/o sob and cough- started this morning around 10am. Has had some chest tightness but she says she feels that when she has a lot of mucus in her chest. She has cough up some phlegm. She used her albuterol inhaler but that did not seem to help.    Review of Systems  Constitutional: Negative.  Negative for diaphoresis.  HENT: Negative.   Respiratory: Positive for cough and shortness of breath.   Cardiovascular: Positive for chest pain.  Gastrointestinal: Negative.   Genitourinary: Negative.   Musculoskeletal: Negative.   Neurological: Negative.   Psychiatric/Behavioral: Negative.   All other systems reviewed and are negative.      Objective:   Physical Exam  Constitutional: She is oriented to person, place, and time. She appears well-developed and well-nourished. No distress.  Cardiovascular: Normal rate and regular rhythm.   Pulmonary/Chest: Effort normal. No respiratory distress. She has no wheezes. She has no rales.  Slightly diminished breath sounds in bil lower lobes  Neurological: She is alert and oriented to person, place, and time.  Skin: Skin is warm.  Psychiatric: She has a normal mood and affect. Her behavior is normal. Judgment and thought content normal.   BP (!) 137/97   Pulse 83   LMP 12/23/1989   SpO2 (!) 89% Comment: dropped as she was moving around on table  EKG- sinus rhythm- no acute Kathlen Mody, FNP   Status post xopenenx breathing- good air exchange on all fields    Assessment & Plan:   1. SOB (shortness of breath)   2. Chest tightness   3. Moderate persistent asthma with acute exacerbation    Meds ordered this encounter  Medications  . levalbuterol (XOPENEX) nebulizer solution 1.25 mg  . methylPREDNISolone acetate (DEPO-MEDROL) injection 80 mg   Albuterol as needed Call pulmonilogist if worsens or go to the  er  Chevis Pretty, FNP

## 2017-12-09 ENCOUNTER — Other Ambulatory Visit: Payer: Self-pay | Admitting: Obstetrics & Gynecology

## 2017-12-09 DIAGNOSIS — Z1239 Encounter for other screening for malignant neoplasm of breast: Secondary | ICD-10-CM

## 2017-12-24 ENCOUNTER — Other Ambulatory Visit: Payer: Self-pay | Admitting: Obstetrics & Gynecology

## 2017-12-24 NOTE — Telephone Encounter (Signed)
Medication refill request: Prozac Last AEX:  11-22-16  Next AEX: 02-27-18  Last MMG (if hormonal medication request): 12-25-16 WNL  Refill authorized: please advise

## 2018-01-12 ENCOUNTER — Ambulatory Visit
Admission: RE | Admit: 2018-01-12 | Discharge: 2018-01-12 | Disposition: A | Discharge status: Home / Self Care | Payer: Medicare Other | Source: Ambulatory Visit | Attending: Obstetrics & Gynecology | Admitting: Obstetrics & Gynecology

## 2018-01-12 DIAGNOSIS — Z1239 Encounter for other screening for malignant neoplasm of breast: Secondary | ICD-10-CM

## 2018-01-23 HISTORY — PX: MASS EXCISION: SHX2000

## 2018-02-27 ENCOUNTER — Ambulatory Visit (INDEPENDENT_AMBULATORY_CARE_PROVIDER_SITE_OTHER): Payer: Medicare Other | Admitting: Obstetrics & Gynecology

## 2018-02-27 ENCOUNTER — Encounter: Payer: Self-pay | Admitting: Obstetrics & Gynecology

## 2018-02-27 ENCOUNTER — Other Ambulatory Visit: Payer: Self-pay

## 2018-02-27 VITALS — BP 136/64 | HR 60 | Resp 16 | Ht 61.5 in | Wt 129.0 lb

## 2018-02-27 DIAGNOSIS — Z01419 Encounter for gynecological examination (general) (routine) without abnormal findings: Secondary | ICD-10-CM | POA: Diagnosis not present

## 2018-02-27 MED ORDER — FLUOXETINE HCL 20 MG PO CAPS: 20.0000 mg | ORAL_CAPSULE | Freq: Every day | ORAL | 4 refills | Status: DC

## 2018-02-27 MED ORDER — MIRABEGRON ER 50 MG PO TB24: 50.0000 mg | ORAL_TABLET | Freq: Every day | ORAL | 4 refills | Status: DC

## 2018-02-27 NOTE — Progress Notes (Signed)
83 y.o. O2V0350 MarriedCaucasianF here for annual exam.  Had a hemorrhoidectomy and has continued to have rectal bleeding since the hemorrhoidectomy.  Had colonoscopy due to continued rectal bleeding.  There was a polyp that was an adenomatous polyp.    She is sure she is not having vaginal bleeding.    Just had hand surgery for cyst on wrist.    Patient's last menstrual period was 12/23/1989.          Sexually active: No.  The current method of family planning is tubal ligation.    Exercising: Yes.    walking. Smoker:  former  Health Maintenance: Pap:  10/20/15 Neg  History of abnormal Pap:  no MMG:  01/12/18 BIRADS1:neg  Colonoscopy:  01/15/18, Dr. Hendricks Limes at [facility] BMD:   06/02/09 Osteopenia  TDaP:  2010 Pneumonia vaccine(s):  2014 Shingrix:   Zostavax 2012  Hep C testing: no Screening Labs: not needed today   reports that she quit smoking about 29 years ago. Her smoking use included cigarettes. She has a 7.50 pack-year smoking history. she has never used smokeless tobacco. She reports that she does not drink alcohol or use drugs.  Past Medical History:  Diagnosis Date  . Anxiety   . Bronchiectasis without complication [facility])    pulmologist-  dr Soledad Gerlach [facility])-- last chest CT 11/ 2017;  last exacerbation possible pneumonia 11/ 2017  resolved per pt  . Cataract of both eyes    mature  . Chronic cough    NON-PRODUCTIVE  . DDD (degenerative disc disease), cervical   . Depression   . GERD (gastroesophageal reflux disease)    watches diet very carefully and takes zantac/gaviscon prn  . Grade III internal hemorrhoids   . History of Clostridium difficile colitis    2012-- resolved  . History of concussion    1973 approx--  no residual  . History of iron deficiency anemia   . History of recurrent pneumonia    last bout possible bacterial 11/ 2017   . IBS (irritable bowel syndrome)   . Mixed stress and urge urinary incontinence   . Moderate persistent asthma    pulmologist-  dr  Soledad Gerlach [facility])    . OA (osteoarthritis)    hands, knees  . OAB (overactive bladder)   . OAB (overactive bladder)   . Osteopenia   . Renal cyst, left    simple-- per urologist note (dr Alona Bene, [facility])  . Seasonal allergic rhinitis   . Wears glasses   . Wears hearing aid    bilateral---  left ear is better than right    Past Surgical History:  Procedure Laterality Date  . BREAST BIOPSY Right yrs ago   benign  . CARDIOVASCULAR STRESS TEST  10/12/2003   normal nuclear study w/ no ischemia/  normal LV function and wall motion, ef 75%  . ENDOVENOUS ABLATION SAPHENOUS VEIN W/ LASER Left 12-08-2013   left greater saphenous vein and sclerotherapy left leg by Curt Jews MD  . EVALUATION UNDER ANESTHESIA WITH HEMORRHOIDECTOMY  05/10/2005  . HEMORRHOID SURGERY N/A 01/01/2017   Procedure: SINGLE COLUMN HEMORRHOIDECTOMY;  Surgeon: Leighton Ruff, MD;  Location: [facility];  Service: General;  Laterality: N/A;  . MASS EXCISION  01/2018   left wrist--path pending  . TRANSTHORACIC ECHOCARDIOGRAM  04-26-2013  [facility])   ef 55-60%/  trivial AR/  mild MR and TR/  RVSF pressure 20mmHg/  no evidence mvp , normal structure and function  . TUBAL LIGATION  Bilateral 1992    Current Outpatient Medications  Medication Sig Dispense Refill  . albuterol (PROAIR HFA) 108 (90 Base) MCG/ACT inhaler Inhale into the lungs every 6 (six) hours as needed for wheezing or shortness of breath.    . Alum Hydroxide-Mag Carbonate (GAVISCON EXTRA STRENGTH PO) Take 1 tablet by mouth as needed.     . Calcium Carb-Cholecalciferol (CALCIUM 1000 + D PO) Take 1 capsule by mouth 2 (two) times daily.    . cetirizine (ZYRTEC) 10 MG tablet Take 10 mg by mouth as needed for allergies.    . chlorpheniramine (CHLOR-TRIMETON) 4 MG tablet Take 4 mg by mouth every 4 (four) hours as needed for allergies.    . Cholecalciferol (VITAMIN D) 2000 UNITS CAPS Take 1 capsule by mouth every morning.     . Cyanocobalamin  (VITAMIN B-12) 2500 MCG SUBL Place under the tongue daily.     Marland Kitchen donepezil (ARICEPT) 5 MG tablet Take 1 tablet by mouth at bedtime.    Marland Kitchen doxycycline (VIBRA-TABS) 100 MG tablet Take 1 tablet (100 mg total) by mouth 2 (two) times daily. 1 po bid 20 tablet 0  . FLUoxetine (PROZAC) 20 MG capsule TAKE (1) CAPSULE DAILY 90 capsule 0  . fluticasone (FLONASE) 50 MCG/ACT nasal spray Place 2 sprays into the nose 2 (two) times daily.     . folic acid (FOLVITE) 573 MCG tablet Take 800 mcg by mouth daily.     . hypromellose (GENTEAL) 0.3 % GEL ophthalmic ointment Place 1 application into both eyes as needed (dry eyes).    Marland Kitchen ibuprofen (ADVIL,MOTRIN) 200 MG tablet Take 200 mg by mouth every 6 (six) hours as needed.    . Magnesium 400 MG CAPS Take 1 capsule by mouth daily.     . mirabegron ER (MYRBETRIQ) 50 MG TB24 tablet Take 1 tablet (50 mg total) by mouth daily. One po qd (Patient taking differently: Take 50 mg by mouth every morning. One po qd) 30 tablet 12  . montelukast (SINGULAIR) 10 MG tablet Take 10 mg by mouth at bedtime.    . OMEGA-3 KRILL OIL PO Take by mouth daily.     . ondansetron (ZOFRAN-ODT) 4 MG disintegrating tablet Place one tablet under your tongue as needed for nausea    . Probiotic Product (PROBIOTIC PO) Take 1 capsule by mouth 2 (two) times daily.     . ranitidine (ZANTAC) 150 MG tablet Take 150 mg by mouth 2 (two) times daily as needed for heartburn.    . trospium (SANCTURA) 20 MG tablet Take 20 mg by mouth 2 (two) times daily.    . vitamin E 400 UNIT capsule Take 400 Units by mouth daily.     No current facility-administered medications for this visit.     Family History  Problem Relation Age of Onset  . Spina bifida Son   . Cancer Paternal Uncle        ? type  . Hypertension Brother   . Heart disease Brother        bypass surgery  . Thyroid disease Daughter   . Thyroid disease Sister   . Breast cancer Neg Hx     ROS:  Pertinent items are noted in HPI.  Otherwise, a  comprehensive ROS was negative.  Exam:   BP 136/64 (BP Location: Right Arm, Patient Position: Sitting, Cuff Size: Normal)   Pulse 60   Resp 16   Ht 5' 1.5" (1.562 m)   Wt 129 lb (58.5 kg)  LMP 12/23/1989   BMI 23.98 kg/m   Weight change: -4#  Height: 5' 1.5" (156.2 cm) Ht Readings from Last 3 Encounters:  02/27/18 5' 1.5" (1.562 m)  08/20/17 5\' 1"  (1.549 m)  12/27/16 5' 1.5" (1.562 m)    General appearance: alert, cooperative and appears stated age Head: Normocephalic, without obvious abnormality, atraumatic Neck: no adenopathy, supple, symmetrical, trachea midline and thyroid normal to inspection and palpation Lungs: clear to auscultation bilaterally Breasts: normal appearance, no masses or tenderness Heart: regular rate and rhythm Abdomen: soft, non-tender; bowel sounds normal; no masses,  no organomegaly Extremities: extremities normal, atraumatic, no cyanosis or edema Skin: Skin color, texture, turgor normal. No rashes or lesions Lymph nodes: Cervical, supraclavicular, and axillary nodes normal. No abnormal inguinal nodes palpated Neurologic: Grossly normal   Pelvic: External genitalia:  no lesions              Urethra:  normal appearing urethra with no masses, tenderness or lesions              Bartholins and Skenes: normal                 Vagina: normal appearing vagina with normal color and discharge, no lesions              Cervix: no lesions              Pap taken: No. Bimanual Exam:  Uterus:  normal size, contour, position, consistency, mobility, non-tender              Adnexa: normal adnexa and no mass, fullness, tenderness               Rectovaginal: Confirms               Anus:  normal sphincter tone, no lesions  Chaperone was present for exam.  A:  Well Woman with normal exam PMP, no HRT H/O hemorrhoidectomy last year, now with almost daily rectal bleeding H/O IBS, liver hemangiomas, pancreatic cysts.  Recent MRI was again stable H/o spinal stenosis H/o  osteoporosis  P:   Mammogram guidelines reveiwed pap smear not indicated.  D/w pt no need to continue these.  She is comfortable with this Will refer pt back to Dr. Marcello Moores for rectal bleeding RF for mybetriq 25mg  daily.  #90/4RF Prozac RF 20mg  daily.  #90/4RF No blood work obtained today Establishing care with new pulmonologist.  Continues to be followed by Dr. Lanell Persons (PCP), Dr. Florene Glen (GI) and Dr. Amalia Hailey (urology) Information about shingrix vaccination given Return annually or prn

## 2018-02-27 NOTE — Patient Instructions (Addendum)
[place] [phone] Please call to see if you can get the new Shingrix vaccine (shingles)

## 2018-07-08 ENCOUNTER — Ambulatory Visit (INDEPENDENT_AMBULATORY_CARE_PROVIDER_SITE_OTHER): Payer: Medicare Other | Admitting: Family Medicine

## 2018-07-08 VITALS — BP 138/66 | HR 61 | Temp 97.1°F | Ht 61.5 in | Wt 129.0 lb

## 2018-07-08 DIAGNOSIS — J4541 Moderate persistent asthma with (acute) exacerbation: Secondary | ICD-10-CM

## 2018-07-08 MED ORDER — BETAMETHASONE SOD PHOS & ACET 6 (3-3) MG/ML IJ SUSP
6.0000 mg | Freq: Once | INTRAMUSCULAR | Status: AC
  Administered 2018-07-08: 6 mg via INTRAMUSCULAR

## 2018-07-08 MED ORDER — MOXIFLOXACIN HCL 400 MG PO TABS: 400.0000 mg | ORAL_TABLET | Freq: Every day | ORAL | 0 refills | Status: AC

## 2018-07-10 ENCOUNTER — Other Ambulatory Visit: Payer: Self-pay | Admitting: Family Medicine

## 2018-07-10 ENCOUNTER — Telehealth: Payer: Self-pay | Admitting: Family Medicine

## 2018-07-10 MED ORDER — PREDNISONE 10 MG (21) PO TBPK: ORAL_TABLET | ORAL | 0 refills | Status: DC

## 2018-07-10 NOTE — Telephone Encounter (Signed)
Left detailed message advising pt requested medication sent into pharmacy and to d/c antibiotic, to call back with any further questions or concerns.

## 2018-07-10 NOTE — Telephone Encounter (Signed)
I sent in prednisone. Have her DC avelox. No Abx, because all can cause C. Diff to flare

## 2018-07-10 NOTE — Telephone Encounter (Signed)
Pt states she is uncomfortable taking the Avelox due to side effects and the fact that she has had Cdiff in the past. Advised pt that almost all antibiotics will have similar side effects with the GI disturbance and asked if there was a particular antibiotic she has taken in the past and is comfortable with but pt states she doesn't take antibiotics often and couldn't remember. Pt states she usually just takes prednisone and does well with that. Advised pt we did give her a steroid injection in the office but pt states she would still like rx for oral prednisone if possible.

## 2018-07-10 NOTE — Telephone Encounter (Signed)
Pt states that she can't take the moxifloxacin (AVELOX) 400 MG tablet due to the side effects that are listed states that it was 3 pages worth that she read through and wants to know if we can send something else in to [facility]. Pt wants to know if she needs to come back she thinks that she may need an antibiotic, pt was seen on Wednesday

## 2018-07-12 ENCOUNTER — Encounter: Payer: Self-pay | Admitting: Family Medicine

## 2018-07-12 NOTE — Progress Notes (Signed)
Chief Complaint  Patient presents with  . URI    cough and congestion  . Shortness of Breath    HPI  Patient presents today for Patient presents with upper respiratory congestion. Rhinorrhea that is frequently purulent. There is moderate sore throat. Patient reports coughing frequently as well.  Thick, cloudy sputum noted. There is no fever, chills or sweats. The patient reports being short of breath. Onset was 3-5 days ago. Gradually worsening. Tried OTCs without improvement.  PMH: Smoking status noted ROS: Per HPI  Objective: BP 138/66 (BP Location: Left Arm)   Pulse 61   Temp (!) 97.1 F (36.2 C) (Oral)   Ht 5' 1.5" (1.562 m)   Wt 129 lb (58.5 kg)   LMP 12/23/1989   SpO2 98% Comment: room air  BMI 23.98 kg/m  Gen: NAD, alert, cooperative with exam HEENT: NCAT, Nasal passages swollen, red TMS RED CV: RRR, good S1/S2, no murmur Resp: Bronchitis changes with scattered wheezes, non-labored Ext: No edema, warm Neuro: Alert and oriented, No gross deficits  Assessment and plan:  1. Moderate persistent asthma with acute exacerbation     Meds ordered this encounter  Medications  . moxifloxacin (AVELOX) 400 MG tablet    Sig: Take 1 tablet (400 mg total) by mouth daily for 10 days.    Dispense:  10 tablet    Refill:  0  . betamethasone acetate-betamethasone sodium phosphate (CELESTONE) injection 6 mg    Follow up as needed.  Claretta Fraise, MD

## 2018-10-23 ENCOUNTER — Ambulatory Visit (INDEPENDENT_AMBULATORY_CARE_PROVIDER_SITE_OTHER): Payer: Medicare Other | Admitting: Pediatrics

## 2018-10-23 ENCOUNTER — Encounter: Payer: Self-pay | Admitting: Pediatrics

## 2018-10-23 VITALS — BP 101/56 | HR 80 | Temp 97.1°F | Ht 61.5 in | Wt 125.0 lb

## 2018-10-23 DIAGNOSIS — K5900 Constipation, unspecified: Secondary | ICD-10-CM | POA: Diagnosis not present

## 2018-10-23 DIAGNOSIS — R110 Nausea: Secondary | ICD-10-CM | POA: Diagnosis not present

## 2018-10-23 DIAGNOSIS — E860 Dehydration: Secondary | ICD-10-CM

## 2018-10-23 MED ORDER — ONDANSETRON HCL 4 MG PO TABS: 4.0000 mg | ORAL_TABLET | Freq: Two times a day (BID) | ORAL | 0 refills | Status: AC | PRN

## 2018-10-23 NOTE — Progress Notes (Signed)
  Subjective:   Patient ID: [name], female    DOB: 29-Nov-1935, 83 y.o.   MRN: [id number] CC: Nausea and headache, bloating (began last night, worse today; taking Zofran ODT)  HPI: [name] is a 83 y.o. female   Intermittently has bloating in her stomach.  Had an episode last night.  Felt like her stomach was very large, full of gas.  Felt uncomfortable.  Some nausea.  Bloating much better today.  Has not had a bowel movement in several days.  Does not usually take anything for constipation.  Appetite is quite low today.  No fevers.  No vomiting.  Relevant past medical, surgical, family and social history reviewed. Allergies and medications reviewed and updated. Social History   Tobacco Use  Smoking Status Former Smoker  . Packs/day: 0.25  . Years: 30.00  . Pack years: 7.50  . Types: Cigarettes  . Last attempt to quit: 12/27/1988  . Years since quitting: 29.8  Smokeless Tobacco Never Used   ROS: Per HPI   Objective:    BP (!) 101/56   Pulse 80   Temp (!) 97.1 F (36.2 C) (Oral)   Ht 5' 1.5" (1.562 m)   Wt 125 lb (56.7 kg)   LMP 12/23/1989   BMI 23.24 kg/m   Wt Readings from Last 3 Encounters:  10/23/18 125 lb (56.7 kg)  07/08/18 129 lb (58.5 kg)  02/27/18 129 lb (58.5 kg)    Gen: NAD, alert, cooperative with exam, NCAT EYES: EOMI, no conjunctival injection, or no icterus ENT:  OP without erythema LYMPH: no cervical LAD CV: NRRR, normal S1/S2, no murmur, distal pulses 2+ b/l Resp: CTABL, no wheezes, normal WOB Abd: +BS, soft, NTND. no guarding or organomegaly Ext: No edema, warm Neuro: Alert and oriented, strength equal b/l UE and LE, coordination grossly normal MSK: normal muscle bulk  Assessment & Plan:  [name] was seen today for nausea and headache, bloating in after-hours clinic.  No fevers.  Symptoms seem primarily related to constipation and bloating.  Will treat constipation, give something for nausea.  Patient to follow-up within the week,  sooner if any worsening symptoms.  Diagnoses and all orders for this visit:  Nausea Wants to switch to tab from ODT because of taste -     ondansetron (ZOFRAN) 4 MG tablet; Take 1 tablet (4 mg total) by mouth 2 (two) times daily as needed for nausea or vomiting.  Constipation, unspecified constipation type Bloating Discussed symptomatic care.  Use MiraLAX 1-3 times a day as needed.  Gave samples of Linzess, start daily in the morning.   Dehydration Blood pressure slightly lower than usual.  Push fluids.  Follow up plan: Return in about 1 week (around 10/30/2018). Assunta Found, MD [place]

## 2019-01-14 ENCOUNTER — Encounter: Payer: Self-pay | Admitting: Family Medicine

## 2019-01-14 ENCOUNTER — Ambulatory Visit (INDEPENDENT_AMBULATORY_CARE_PROVIDER_SITE_OTHER): Payer: Medicare Other | Admitting: Family Medicine

## 2019-01-14 VITALS — BP 127/65 | HR 71 | Temp 98.1°F | Ht 61.5 in | Wt 126.4 lb

## 2019-01-14 DIAGNOSIS — J3089 Other allergic rhinitis: Secondary | ICD-10-CM | POA: Diagnosis not present

## 2019-01-14 DIAGNOSIS — J329 Chronic sinusitis, unspecified: Secondary | ICD-10-CM

## 2019-01-14 MED ORDER — AZELASTINE HCL 0.1 % NA SOLN: 2.0000 | Freq: Two times a day (BID) | NASAL | 12 refills | Status: DC

## 2019-01-14 MED ORDER — METHYLPREDNISOLONE ACETATE 80 MG/ML IJ SUSP
40.0000 mg | Freq: Once | INTRAMUSCULAR | Status: AC
  Administered 2019-01-14: 40 mg via INTRAMUSCULAR

## 2019-01-14 NOTE — Progress Notes (Signed)
Subjective: CC: Sinusitis PCP: Claretta Fraise, MD [id number] [name] is a 84 y.o. female presenting to clinic today for:  1.  Sinusitis Patient reports chronic issues with rhinosinusitis.  She notes that this seems to occur and increase in severity every year.  She denies any infectious symptoms including fevers, chills, nausea, vomiting, hemoptysis or purulence from nares.  She reports postnasal drip, rhinorrhea, sinus pressure within the frontal and maxillary sinuses and hoarse voice.  She has had a productive cough intermittently and has a history of asthma.  She uses multiple medications including Zyrtec, Chlor-Trimeton, Flonase nasal spray, Singulair and albuterol.  She also perform sinus rinses regularly which she does find to be helpful but symptoms never stay improved.  Denies any shortness of breath or sensation of throat swelling.  ROS: Per HPI  Allergies  Allergen Reactions  . Chocolate Flavor Nausea Only  . Lactose Nausea Only  . Latex Itching    Skin reddness Skin reddness  . Penicillins Other (See Comments)    Arthritis hands/feet  . Statins Other (See Comments)  . Tylenol With Codeine #3 [Acetaminophen-Codeine] Other (See Comments)    Nausea, dizziness  . Other Palpitations and Other (See Comments)    Pain meds make her shaky, several years ago.   Past Medical History:  Diagnosis Date  . Anxiety   . Bronchiectasis without complication [facility])    pulmologist-  dr Soledad Gerlach [facility])-- last chest CT 11/ 2017;  last exacerbation possible pneumonia 11/ 2017  resolved per pt  . Cataract of both eyes    mature  . Chronic cough    NON-PRODUCTIVE  . DDD (degenerative disc disease), cervical   . Depression   . GERD (gastroesophageal reflux disease)    watches diet very carefully and takes zantac/gaviscon prn  . Grade III internal hemorrhoids   . History of Clostridium difficile colitis    2012-- resolved  . History of concussion    1973 approx--  no residual  .  History of iron deficiency anemia   . History of recurrent pneumonia    last bout possible bacterial 11/ 2017   . IBS (irritable bowel syndrome)   . Mixed stress and urge urinary incontinence   . Moderate persistent asthma    pulmologist-  dr Soledad Gerlach [facility])    . OA (osteoarthritis)    hands, knees  . OAB (overactive bladder)   . OAB (overactive bladder)   . Osteopenia   . Renal cyst, left    simple-- per urologist note (dr Alona Bene, [facility])  . Seasonal allergic rhinitis   . Wears glasses   . Wears hearing aid    bilateral---  left ear is better than right    Current Outpatient Medications:  .  albuterol (PROAIR HFA) 108 (90 Base) MCG/ACT inhaler, Inhale into the lungs every 6 (six) hours as needed for wheezing or shortness of breath., Disp: , Rfl:  .  Alum Hydroxide-Mag Carbonate (GAVISCON EXTRA STRENGTH PO), Take 1 tablet by mouth as needed. , Disp: , Rfl:  .  Calcium Carb-Cholecalciferol (CALCIUM 1000 + D PO), Take 1 capsule by mouth 2 (two) times daily., Disp: , Rfl:  .  cetirizine (ZYRTEC) 10 MG tablet, Take 10 mg by mouth as needed for allergies., Disp: , Rfl:  .  chlorpheniramine (CHLOR-TRIMETON) 4 MG tablet, Take 4 mg by mouth every 4 (four) hours as needed for allergies., Disp: , Rfl:  .  Cholecalciferol (VITAMIN D) 2000 UNITS CAPS, Take 1 capsule  by mouth every morning. , Disp: , Rfl:  .  Cyanocobalamin (VITAMIN B-12) 2500 MCG SUBL, Place under the tongue daily. , Disp: , Rfl:  .  FLUoxetine (PROZAC) 20 MG capsule, Take 1 capsule (20 mg total) by mouth daily., Disp: 90 capsule, Rfl: 4 .  fluticasone (FLONASE) 50 MCG/ACT nasal spray, Place 2 sprays into the nose 2 (two) times daily. , Disp: , Rfl:  .  folic acid (FOLVITE) 250 MCG tablet, Take 800 mcg by mouth daily. , Disp: , Rfl:  .  hypromellose (GENTEAL) 0.3 % GEL ophthalmic ointment, Place 1 application into both eyes as needed (dry eyes)., Disp: , Rfl:  .  ibuprofen (ADVIL,MOTRIN) 200 MG tablet, Take 200 mg by mouth  every 6 (six) hours as needed., Disp: , Rfl:  .  Magnesium 400 MG CAPS, Take 1 capsule by mouth daily. , Disp: , Rfl:  .  mirabegron ER (MYRBETRIQ) 50 MG TB24 tablet, Take 1 tablet (50 mg total) by mouth daily. One po qd, Disp: 90 tablet, Rfl: 4 .  montelukast (SINGULAIR) 10 MG tablet, Take 10 mg by mouth at bedtime., Disp: , Rfl:  .  OMEGA-3 KRILL OIL PO, Take by mouth daily. , Disp: , Rfl:  .  ondansetron (ZOFRAN) 4 MG tablet, Take 1 tablet (4 mg total) by mouth 2 (two) times daily as needed for nausea or vomiting., Disp: 15 tablet, Rfl: 0 .  Probiotic Product (PROBIOTIC PO), Take 1 capsule by mouth 2 (two) times daily. , Disp: , Rfl:  .  vitamin E 400 UNIT capsule, Take 400 Units by mouth daily., Disp: , Rfl:  Social History   Socioeconomic History  . Marital status: Married    Spouse name: Not on file  . Number of children: Not on file  . Years of education: Not on file  . Highest education level: Not on file  Occupational History  . Not on file  Social Needs  . Financial resource strain: Not on file  . Food insecurity:    Worry: Not on file    Inability: Not on file  . Transportation needs:    Medical: Not on file    Non-medical: Not on file  Tobacco Use  . Smoking status: Former Smoker    Packs/day: 0.25    Years: 30.00    Pack years: 7.50    Types: Cigarettes    Last attempt to quit: 12/27/1988    Years since quitting: 30.0  . Smokeless tobacco: Never Used  Substance and Sexual Activity  . Alcohol use: No    Alcohol/week: 0.0 standard drinks  . Drug use: No  . Sexual activity: Never    Partners: Male    Birth control/protection: Surgical    Comment: BTL  Lifestyle  . Physical activity:    Days per week: Not on file    Minutes per session: Not on file  . Stress: Not on file  Relationships  . Social connections:    Talks on phone: Not on file    Gets together: Not on file    Attends religious service: Not on file    Active member of club or organization: Not on  file    Attends meetings of clubs or organizations: Not on file    Relationship status: Not on file  . Intimate partner violence:    Fear of current or ex partner: Not on file    Emotionally abused: Not on file    Physically abused: Not on file  Forced sexual activity: Not on file  Other Topics Concern  . Not on file  Social History Narrative  . Not on file   Family History  Problem Relation Age of Onset  . Spina bifida Son   . Cancer Paternal Uncle        ? type  . Hypertension Brother   . Heart disease Brother        bypass surgery  . Thyroid disease Daughter   . Thyroid disease Sister   . Breast cancer Neg Hx     Objective: Office vital signs reviewed. BP 127/65   Pulse 71   Temp 98.1 F (36.7 C) (Oral)   Ht 5' 1.5" (1.562 m)   Wt 126 lb 6.4 oz (57.3 kg)   LMP 12/23/1989   SpO2 95%   BMI 23.50 kg/m   Physical Examination:  General: Awake, alert, well nourished, No acute distress HEENT: +TTP to frontal and maxillary sinuses    Neck: No masses palpated. No lymphadenopathy    Ears: Tympanic membranes obscured by cerumen.    Eyes: PERRLA, extraocular membranes intact, sclera white    Nose: nasal turbinates moist, clear nasal discharge; she has a hemostatic bleed within the left nare.    Throat: moist mucus membranes, no erythema, no tonsillar exudate.  Airway is patent Cardio: regular rate and rhythm, S1S2 heard, no murmurs appreciated Pulm: clear to auscultation bilaterally, no wheezes, rhonchi or rales; normal work of breathing on room air  Assessment/ Plan: 84 y.o. female   1. Non-seasonal allergic rhinitis, unspecified trigger I have started Astelin nasal spray.  She may continue other home care remedies.  Of asked her to hold Flonase for now but she may resume if needed.  We discussed reasons for return evaluation.  She is to follow-up with PCP for ongoing needs.  2. Rhinosinusitis Because of significant/severe symptoms despite many modalities of  antihistamine treatment, I have given her a dose of Depo-Medrol intramuscularly here in office.  She will follow-up with PCP for ongoing needs. - methylPREDNISolone acetate (DEPO-MEDROL) injection 40 mg   No orders of the defined types were placed in this encounter.  Meds ordered this encounter  Medications  . DISCONTD: azelastine (ASTELIN) 0.1 % nasal spray    Sig: Place 2 sprays into both nostrils 2 (two) times daily. Use in each nostril as directed    Dispense:  30 mL    Refill:  12  . azelastine (ASTELIN) 0.1 % nasal spray    Sig: Place 2 sprays into both nostrils 2 (two) times daily. Use in each nostril as directed    Dispense:  30 mL    Refill:  12  . methylPREDNISolone acetate (DEPO-MEDROL) injection 40 mg     Janora Norlander, DO [place] [phone]

## 2019-01-14 NOTE — Patient Instructions (Signed)
You may continue your other therapies for allergy symptoms.  We discussed holding your Flonase and starting the Astelin spray instead.  If you find a need for Flonase you can add this back in addition to Astelin.  Follow-up with Dr. Livia Snellen for ongoing needs.  I value your feedback and appreciate you entrusting [place] with your care.  If you get a survey, I would appreciate your taking the time to let us know what your experience was like.   Allergies, Adult An allergy means that your body reacts to something that bothers it (allergen). It is not a normal reaction. This can happen from something that you:  Eat.  Breathe in.  Touch. You can have an allergy (be allergic) to:  Outdoor things, like: ? Pollen. ? Grass. ? Weeds.  Indoor things, like: ? Dust. ? Smoke. ? Pet dander.  Foods.  Medicines.  Things that bother your skin, like: ? Detergents. ? Chemicals. ? Latex.  Perfume.  Bugs. An allergy cannot spread from person to person (is not contagious). Follow these instructions at home:         Stay away from things that you know you are allergic to.  If you have allergies to things in the air, wash out your nose each day. Do it with one of these: ? A salt-water (saline) spray. ? A container (neti pot).  Take over-the-counter and prescription medicines only as told by your doctor.  Keep all follow-up visits as told by your doctor. This is important.  If you are at risk for a very bad allergy reaction (anaphylaxis), keep an auto-injector with you all the time. This is called an epinephrine injection. ? This is pre-measured medicine with a needle. You can put it into your skin by yourself. ? Right after you have a very bad allergy reaction, you or a person with you must give the medicine in less than a few minutes. This is an emergency.  If you have ever had a very bad allergy reaction, wear a medical alert bracelet or necklace. Your very bad allergy should be written  on it. Contact a health care provider if:  Your symptoms do not get better with treatment. Get help right away if:  You have symptoms of a very bad allergy reaction. These include: ? A swollen mouth, tongue, or throat. ? Pain or tightness in your chest. ? Trouble breathing. ? Being short of breath. ? Dizziness. ? Fainting. ? Very bad pain in your belly (abdomen). ? Throwing up (vomiting). ? Watery poop (diarrhea). Summary  An allergy means that your body reacts to something that bothers it (allergen). It is not a normal reaction.  Stay away from things that make your body react.  Take over-the-counter and prescription medicines only as told by your doctor.  If you are at risk for a very bad allergy reaction, carry an auto-injector (epinephrine injection) all the time. Also, wear a medical alert bracelet or necklace so people know about your allergy. This information is not intended to replace advice given to you by your health care provider. Make sure you discuss any questions you have with your health care provider. Document Released: 04/05/2013 Document Revised: 03/24/2017 Document Reviewed: 03/24/2017 Elsevier Interactive Patient Education  2019 [employer].

## 2019-01-18 ENCOUNTER — Ambulatory Visit (INDEPENDENT_AMBULATORY_CARE_PROVIDER_SITE_OTHER): Payer: Medicare Other | Admitting: Family Medicine

## 2019-01-18 ENCOUNTER — Encounter: Payer: Self-pay | Admitting: Family Medicine

## 2019-01-18 VITALS — BP 134/63 | HR 75 | Temp 97.4°F | Resp 18 | Ht 61.5 in | Wt 126.4 lb

## 2019-01-18 DIAGNOSIS — J069 Acute upper respiratory infection, unspecified: Secondary | ICD-10-CM | POA: Diagnosis not present

## 2019-01-18 MED ORDER — DOXYCYCLINE HYCLATE 100 MG PO TABS: 100.0000 mg | ORAL_TABLET | Freq: Two times a day (BID) | ORAL | 0 refills | Status: DC

## 2019-01-18 MED ORDER — PREDNISONE 20 MG PO TABS: ORAL_TABLET | ORAL | 0 refills | Status: DC

## 2019-01-18 MED ORDER — DOXYCYCLINE HYCLATE 100 MG PO TABS: 100.0000 mg | ORAL_TABLET | Freq: Two times a day (BID) | ORAL | 0 refills | Status: AC

## 2019-01-18 NOTE — Addendum Note (Signed)
Addended by: Marin Olp on: 01/18/2019 05:54 PM   Modules accepted: Orders

## 2019-01-18 NOTE — Patient Instructions (Addendum)

## 2019-01-18 NOTE — Progress Notes (Signed)
Subjective:    Patient ID: [name], female    DOB: 01/31/1935, 84 y.o.   MRN: [id number]  Chief Complaint:  URI (continued head and chest congestion, prod cough, sinus presure, bilateral ear pain, drainage)   HPI: [name] is a 84 y.o. female presenting on 01/18/2019 for URI (continued head and chest congestion, prod cough, sinus presure, bilateral ear pain, drainage)   1. URI with cough and congestion   Pt presents today for complaints of cough, chest congestion, right ear fullness, and sinus pressure. Pt states this started in December. States she was seen here last week and given a steroid shot, states she got better for a few days and then got worse. Pt states she has increased cough, shortness of breath with exertion, and thick sputum production. Pts daughter states she has a history of COPD. Pt denies a history of COPD. Pt states she had been fatigued.    Relevant past medical, surgical, family, and social history reviewed and updated as indicated.  Allergies and medications reviewed and updated.   Past Medical History:  Diagnosis Date  . Anxiety   . Bronchiectasis without complication [facility])    pulmologist-  dr Soledad Gerlach [facility])-- last chest CT 11/ 2017;  last exacerbation possible pneumonia 11/ 2017  resolved per pt  . Cataract of both eyes    mature  . Chronic cough    NON-PRODUCTIVE  . DDD (degenerative disc disease), cervical   . Depression   . GERD (gastroesophageal reflux disease)    watches diet very carefully and takes zantac/gaviscon prn  . Grade III internal hemorrhoids   . History of Clostridium difficile colitis    2012-- resolved  . History of concussion    1973 approx--  no residual  . History of iron deficiency anemia   . History of recurrent pneumonia    last bout possible bacterial 11/ 2017   . IBS (irritable bowel syndrome)   . Mixed stress and urge urinary incontinence   . Moderate persistent asthma    pulmologist-  dr Soledad Gerlach [facility])    . OA (osteoarthritis)    hands, knees  . OAB (overactive bladder)   . OAB (overactive bladder)   . Osteopenia   . Renal cyst, left    simple-- per urologist note (dr Alona Bene, [facility])  . Seasonal allergic rhinitis   . Wears glasses   . Wears hearing aid    bilateral---  left ear is better than right    Past Surgical History:  Procedure Laterality Date  . BREAST BIOPSY Right yrs ago   benign  . CARDIOVASCULAR STRESS TEST  10/12/2003   normal nuclear study w/ no ischemia/  normal LV function and wall motion, ef 75%  . ENDOVENOUS ABLATION SAPHENOUS VEIN W/ LASER Left 12-08-2013   left greater saphenous vein and sclerotherapy left leg by Curt Jews MD  . EVALUATION UNDER ANESTHESIA WITH HEMORRHOIDECTOMY  05/10/2005  . HEMORRHOID SURGERY N/A 01/01/2017   Procedure: SINGLE COLUMN HEMORRHOIDECTOMY;  Surgeon: Leighton Ruff, MD;  Location: [facility];  Service: General;  Laterality: N/A;  . MASS EXCISION  01/2018   left wrist--path pending  . TRANSTHORACIC ECHOCARDIOGRAM  04-26-2013  [facility])   ef 55-60%/  trivial AR/  mild MR and TR/  RVSF pressure 62mmHg/  no evidence mvp , normal structure and function  . TUBAL LIGATION Bilateral 1992    Social History   Socioeconomic History  . Marital  status: Married    Spouse name: Not on file  . Number of children: Not on file  . Years of education: Not on file  . Highest education level: Not on file  Occupational History  . Not on file  Social Needs  . Financial resource strain: Not on file  . Food insecurity:    Worry: Not on file    Inability: Not on file  . Transportation needs:    Medical: Not on file    Non-medical: Not on file  Tobacco Use  . Smoking status: Former Smoker    Packs/day: 0.25    Years: 30.00    Pack years: 7.50    Types: Cigarettes    Last attempt to quit: 12/27/1988    Years since quitting: 30.0  . Smokeless tobacco: Never Used  Substance and Sexual Activity  . Alcohol use:  No    Alcohol/week: 0.0 standard drinks  . Drug use: No  . Sexual activity: Never    Partners: Male    Birth control/protection: Surgical    Comment: BTL  Lifestyle  . Physical activity:    Days per week: Not on file    Minutes per session: Not on file  . Stress: Not on file  Relationships  . Social connections:    Talks on phone: Not on file    Gets together: Not on file    Attends religious service: Not on file    Active member of club or organization: Not on file    Attends meetings of clubs or organizations: Not on file    Relationship status: Not on file  . Intimate partner violence:    Fear of current or ex partner: Not on file    Emotionally abused: Not on file    Physically abused: Not on file    Forced sexual activity: Not on file  Other Topics Concern  . Not on file  Social History Narrative  . Not on file    Outpatient Encounter Medications as of 01/18/2019  Medication Sig  . albuterol (PROAIR HFA) 108 (90 Base) MCG/ACT inhaler Inhale into the lungs every 6 (six) hours as needed for wheezing or shortness of breath.  . Alum Hydroxide-Mag Carbonate (GAVISCON EXTRA STRENGTH PO) Take 1 tablet by mouth as needed.   Marland Kitchen azelastine (ASTELIN) 0.1 % nasal spray Place 2 sprays into both nostrils 2 (two) times daily. Use in each nostril as directed  . Calcium Carb-Cholecalciferol (CALCIUM 1000 + D PO) Take 1 capsule by mouth 2 (two) times daily.  . cetirizine (ZYRTEC) 10 MG tablet Take 10 mg by mouth as needed for allergies.  . chlorpheniramine (CHLOR-TRIMETON) 4 MG tablet Take 4 mg by mouth every 4 (four) hours as needed for allergies.  . Cholecalciferol (VITAMIN D) 2000 UNITS CAPS Take 1 capsule by mouth every morning.   . Cyanocobalamin (VITAMIN B-12) 2500 MCG SUBL Place under the tongue daily.   Marland Kitchen FLUoxetine (PROZAC) 20 MG capsule Take 1 capsule (20 mg total) by mouth daily.  . folic acid (FOLVITE) 810 MCG tablet Take 800 mcg by mouth daily.   . hypromellose (GENTEAL) 0.3  % GEL ophthalmic ointment Place 1 application into both eyes as needed (dry eyes).  Marland Kitchen ibuprofen (ADVIL,MOTRIN) 200 MG tablet Take 200 mg by mouth every 6 (six) hours as needed.  . Magnesium 400 MG CAPS Take 1 capsule by mouth daily.   . mirabegron ER (MYRBETRIQ) 50 MG TB24 tablet Take 1 tablet (50 mg total) by mouth  daily. One po qd  . montelukast (SINGULAIR) 10 MG tablet Take 10 mg by mouth at bedtime.  . OMEGA-3 KRILL OIL PO Take by mouth daily.   . ondansetron (ZOFRAN) 4 MG tablet Take 1 tablet (4 mg total) by mouth 2 (two) times daily as needed for nausea or vomiting.  . Probiotic Product (PROBIOTIC PO) Take 1 capsule by mouth 2 (two) times daily.   . vitamin E 400 UNIT capsule Take 400 Units by mouth daily.  Marland Kitchen doxycycline (VIBRA-TABS) 100 MG tablet Take 1 tablet (100 mg total) by mouth 2 (two) times daily for 7 days. 1 po bid  . fluticasone (FLONASE) 50 MCG/ACT nasal spray Place 2 sprays into the nose 2 (two) times daily.   . predniSONE (DELTASONE) 20 MG tablet 2 po at sametime daily for 5 days   No facility-administered encounter medications on file as of 01/18/2019.     Allergies  Allergen Reactions  . Chocolate Flavor Nausea Only  . Lactose Nausea Only  . Latex Itching    Skin reddness Skin reddness  . Penicillins Other (See Comments)    Arthritis hands/feet  . Statins Other (See Comments)  . Tylenol With Codeine #3 [Acetaminophen-Codeine] Other (See Comments)    Nausea, dizziness  . Other Palpitations and Other (See Comments)    Pain meds make her shaky, several years ago.    Review of Systems  Constitutional: Positive for activity change, chills and fatigue. Negative for fever.  HENT: Positive for congestion, ear pain, sinus pressure, sinus pain and tinnitus.   Respiratory: Positive for cough, shortness of breath and wheezing.   Cardiovascular: Negative for chest pain, palpitations and leg swelling.  Neurological: Negative for dizziness, weakness, light-headedness and  headaches.  Psychiatric/Behavioral: Negative for confusion.  All other systems reviewed and are negative.       Objective:    BP 134/63   Pulse 75   Temp (!) 97.4 F (36.3 C)   Resp 18   Ht 5' 1.5" (1.562 m)   Wt 126 lb 6.4 oz (57.3 kg)   LMP 12/23/1989   SpO2 95%   BMI 23.50 kg/m    Wt Readings from Last 3 Encounters:  01/18/19 126 lb 6.4 oz (57.3 kg)  01/14/19 126 lb 6.4 oz (57.3 kg)  10/23/18 125 lb (56.7 kg)    Physical Exam Vitals signs and nursing note reviewed.  Constitutional:      General: She is not in acute distress.    Appearance: Normal appearance. She is well-developed and well-groomed. She is not ill-appearing or toxic-appearing.  HENT:     Head: Normocephalic.     Right Ear: Ear canal and external ear normal. A middle ear effusion is present. Tympanic membrane is not perforated or erythematous.     Left Ear: Tympanic membrane, ear canal and external ear normal.     Nose: Nose normal.     Right Sinus: No maxillary sinus tenderness or frontal sinus tenderness.     Left Sinus: No maxillary sinus tenderness or frontal sinus tenderness.     Mouth/Throat:     Lips: Pink.     Mouth: Mucous membranes are moist.     Pharynx: Oropharynx is clear. Uvula midline.     Tonsils: No tonsillar exudate or tonsillar abscesses.  Cardiovascular:     Rate and Rhythm: Normal rate and regular rhythm.     Heart sounds: Normal heart sounds. No murmur. No friction rub. No gallop.   Pulmonary:  Effort: Prolonged expiration present. No respiratory distress.     Breath sounds: Wheezing (scattered, mild) present.  Skin:    General: Skin is warm and dry.     Capillary Refill: Capillary refill takes less than 2 seconds.     Coloration: Skin is not cyanotic or pale.  Neurological:     General: No focal deficit present.     Mental Status: She is alert and oriented to person, place, and time.  Psychiatric:        Mood and Affect: Mood normal.        Behavior: Behavior  normal. Behavior is cooperative.        Thought Content: Thought content normal.        Judgment: Judgment normal.     Results for orders placed or performed in visit on 08/20/17  CBC with Differential/Platelet  Result Value Ref Range   WBC 4.7 3.4 - 10.8 x10E3/uL   RBC 4.00 3.77 - 5.28 x10E6/uL   Hemoglobin 11.4 11.1 - 15.9 g/dL   Hematocrit 35.4 34.0 - 46.6 %   MCV 89 79 - 97 fL   MCH 28.5 26.6 - 33.0 pg   MCHC 32.2 31.5 - 35.7 g/dL   RDW 14.6 12.3 - 15.4 %   Platelets 247 150 - 379 x10E3/uL   Neutrophils 64 Not Estab. %   Lymphs 16 Not Estab. %   Monocytes 17 Not Estab. %   Eos 2 Not Estab. %   Basos 1 Not Estab. %   Neutrophils Absolute 3.0 1.4 - 7.0 x10E3/uL   Lymphocytes Absolute 0.8 0.7 - 3.1 x10E3/uL   Monocytes Absolute 0.8 0.1 - 0.9 x10E3/uL   EOS (ABSOLUTE) 0.1 0.0 - 0.4 x10E3/uL   Basophils Absolute 0.0 0.0 - 0.2 x10E3/uL   Immature Granulocytes 0 Not Estab. %   Immature Grans (Abs) 0.0 0.0 - 0.1 x10E3/uL  Lyme Ab/Western Blot Reflex  Result Value Ref Range   Lyme IgG/IgM Ab <0.91 0.00 - 0.90 ISR   LYME DISEASE AB, QUANT, IGM <0.80 0.00 - 0.79 index       Pertinent labs & imaging results that were available during my care of the patient were reviewed by me and considered in my medical decision making.  Assessment & Plan:  Mahlani was seen today for uri.  Diagnoses and all orders for this visit:  URI with cough and congestion Increase fluid intake. Pt reluctant to start antibiotics. Pt aware to start the antibiotics if she is not improving in the next two days. Pt aware to report any new or worsening symptoms.  -     predniSONE (DELTASONE) 20 MG tablet; 2 po at sametime daily for 5 days -     doxycycline (VIBRA-TABS) 100 MG tablet; Take 1 tablet (100 mg total) by mouth 2 (two) times daily for 7 days. 1 po bid    Continue all other maintenance medications.  Follow up plan: Return in 4 weeks (on 02/15/2019).  Educational handout given for URI  The  above assessment and management plan was discussed with the patient. The patient verbalized understanding of and has agreed to the management plan. Patient is aware to call the clinic if symptoms persist or worsen. Patient is aware when to return to the clinic for a follow-up visit. Patient educated on when it is appropriate to go to the emergency department.   Monia Pouch, FNP-C [facility] [phone]

## 2019-02-23 ENCOUNTER — Other Ambulatory Visit: Payer: Self-pay | Admitting: Obstetrics & Gynecology

## 2019-02-23 DIAGNOSIS — Z1231 Encounter for screening mammogram for malignant neoplasm of breast: Secondary | ICD-10-CM

## 2019-03-06 ENCOUNTER — Other Ambulatory Visit: Payer: Self-pay | Admitting: Obstetrics & Gynecology

## 2019-03-08 NOTE — Telephone Encounter (Signed)
Medication refill request: Myrbetriq Last AEX:  02/27/18 SM Next AEX: 05/14/19  Last MMG (if hormonal medication request): 01/12/18 BIRADS 1 negative/density b -- scheduled 03/23/19 Refill authorized: 02/27/18 #90 w/4 refills; today order pended for #90 w/0 refills if authorized

## 2019-03-23 ENCOUNTER — Ambulatory Visit: Payer: Medicare Other

## 2019-05-12 ENCOUNTER — Other Ambulatory Visit: Payer: Self-pay

## 2019-05-13 ENCOUNTER — Ambulatory Visit
Admission: RE | Admit: 2019-05-13 | Discharge: 2019-05-13 | Disposition: A | Discharge status: Home / Self Care | Payer: Medicare Other | Source: Ambulatory Visit | Attending: Obstetrics & Gynecology | Admitting: Obstetrics & Gynecology

## 2019-05-13 ENCOUNTER — Ambulatory Visit: Payer: Medicare Other

## 2019-05-13 DIAGNOSIS — Z1231 Encounter for screening mammogram for malignant neoplasm of breast: Secondary | ICD-10-CM

## 2019-05-14 ENCOUNTER — Encounter: Payer: Self-pay | Admitting: Obstetrics & Gynecology

## 2019-05-14 ENCOUNTER — Ambulatory Visit (INDEPENDENT_AMBULATORY_CARE_PROVIDER_SITE_OTHER): Payer: Medicare Other | Admitting: Obstetrics & Gynecology

## 2019-05-14 ENCOUNTER — Ambulatory Visit: Payer: Medicare Other

## 2019-05-14 ENCOUNTER — Other Ambulatory Visit: Payer: Self-pay

## 2019-05-14 VITALS — BP 112/70 | HR 76 | Temp 97.4°F | Resp 12 | Ht 61.5 in | Wt 129.0 lb

## 2019-05-14 DIAGNOSIS — Z01419 Encounter for gynecological examination (general) (routine) without abnormal findings: Secondary | ICD-10-CM

## 2019-05-14 MED ORDER — FLUOXETINE HCL 20 MG PO CAPS: 20.0000 mg | ORAL_CAPSULE | Freq: Every day | ORAL | 4 refills | Status: DC

## 2019-05-14 MED ORDER — MIRABEGRON ER 50 MG PO TB24: 50.0000 mg | ORAL_TABLET | Freq: Every day | ORAL | 4 refills | Status: DC

## 2019-05-14 NOTE — Progress Notes (Signed)
84 y.o. G45P2002 Married White or Caucasian female here for annual exam.  Doing well.  Has been staying home and taking care of herself.  She did have pneumonia in February and needed to go to the emergency room.  Pt reports she had so much coughing and had a lot of urinary leakage.  That has improved since the pneumonia was treated and improved.  Gets up once at night to void.    Denies vaginal bleeding.    Has IPMN of the pancreas and will have MRI in 08/2019.    Had rectal bleeding at beginning of January.  Had colonoscopy at [facility].  Reviewed results in [place].    Patient's last menstrual period was 12/23/1989.          Sexually active: No.  The current method of family planning is tubal ligation.    Exercising: Yes.    walking Smoker:  No, former  Health Maintenance: Pap:  10/20/15 neg   History of abnormal Pap:  no MMG:  05/13/19 BIRADS1:Neg  Colonoscopy: 01/15/2018 Dr. Hendricks Limes done due to rectal bleeding.   BMD:   06/02/09 Osteopenia  TDaP: 12/2007 (pt aware this is due but does not want this today) Pneumonia vaccine(s):  2014 Shingrix:   Zoster in 2012 Hep C testing: n/a Screening Labs: PCP   reports that she quit smoking about 30 years ago. Her smoking use included cigarettes. She has a 7.50 pack-year smoking history. She has never used smokeless tobacco. She reports that she does not drink alcohol or use drugs.  Past Medical History:  Diagnosis Date  . Anxiety   . Bronchiectasis without complication [facility])    pulmologist-  dr Soledad Gerlach [facility])-- last chest CT 11/ 2017;  last exacerbation possible pneumonia 11/ 2017  resolved per pt  . Cataract of both eyes    mature  . Chronic cough    NON-PRODUCTIVE  . DDD (degenerative disc disease), cervical   . Depression   . GERD (gastroesophageal reflux disease)    watches diet very carefully and takes zantac/gaviscon prn  . Grade III internal hemorrhoids   . History of Clostridium difficile colitis    2012-- resolved  .  History of concussion    1973 approx--  no residual  . History of iron deficiency anemia   . History of recurrent pneumonia    last bout possible bacterial 11/ 2017   . IBS (irritable bowel syndrome)   . Mixed stress and urge urinary incontinence   . Moderate persistent asthma    pulmologist-  dr Soledad Gerlach [facility])    . OA (osteoarthritis)    hands, knees  . OAB (overactive bladder)   . OAB (overactive bladder)   . Osteopenia   . Renal cyst, left    simple-- per urologist note (dr Alona Bene, [facility])  . Seasonal allergic rhinitis   . Wears glasses   . Wears hearing aid    bilateral---  left ear is better than right    Past Surgical History:  Procedure Laterality Date  . BREAST BIOPSY Right yrs ago   benign  . CARDIOVASCULAR STRESS TEST  10/12/2003   normal nuclear study w/ no ischemia/  normal LV function and wall motion, ef 75%  . ENDOVENOUS ABLATION SAPHENOUS VEIN W/ LASER Left 12-08-2013   left greater saphenous vein and sclerotherapy left leg by Curt Jews MD  . EVALUATION UNDER ANESTHESIA WITH HEMORRHOIDECTOMY  05/10/2005  . HEMORRHOID SURGERY N/A 01/01/2017   Procedure: SINGLE COLUMN  HEMORRHOIDECTOMY;  Surgeon: Leighton Ruff, MD;  [facility];  Service: General;  Laterality: N/A;  . MASS EXCISION  01/2018   left wrist--path pending  . TRANSTHORACIC ECHOCARDIOGRAM  04-26-2013  [facility])   ef 55-60%/  trivial AR/  mild MR and TR/  RVSF pressure 80mmHg/  no evidence mvp , normal structure and function  . TUBAL LIGATION Bilateral 1992    Current Outpatient Medications  Medication Sig Dispense Refill  . albuterol (PROAIR HFA) 108 (90 Base) MCG/ACT inhaler Inhale into the lungs every 6 (six) hours as needed for wheezing or shortness of breath.    . Alum Hydroxide-Mag Carbonate (GAVISCON EXTRA STRENGTH PO) Take 1 tablet by mouth as needed.     Marland Kitchen azelastine (ASTELIN) 0.1 % nasal spray Place 2 sprays into both nostrils 2 (two) times daily. Use in each  nostril as directed 30 mL 12  . Calcium Carb-Cholecalciferol (CALCIUM 1000 + D PO) Take 1 capsule by mouth 2 (two) times daily.    . cetirizine (ZYRTEC) 10 MG tablet Take 10 mg by mouth as needed for allergies.    . chlorpheniramine (CHLOR-TRIMETON) 4 MG tablet Take 4 mg by mouth every 4 (four) hours as needed for allergies.    . Cholecalciferol (VITAMIN D) 2000 UNITS CAPS Take 1 capsule by mouth every morning.     . Cyanocobalamin (VITAMIN B-12) 2500 MCG SUBL Place under the tongue daily.     Marland Kitchen FLUoxetine (PROZAC) 20 MG capsule Take 1 capsule (20 mg total) by mouth daily. 90 capsule 4  . fluticasone (FLONASE) 50 MCG/ACT nasal spray Place 2 sprays into the nose 2 (two) times daily.     . folic acid (FOLVITE) 892 MCG tablet Take 800 mcg by mouth daily.     . hypromellose (GENTEAL) 0.3 % GEL ophthalmic ointment Place 1 application into both eyes as needed (dry eyes).    Marland Kitchen ibuprofen (ADVIL,MOTRIN) 200 MG tablet Take 200 mg by mouth every 6 (six) hours as needed.    . Magnesium 400 MG CAPS Take 1 capsule by mouth daily.     . montelukast (SINGULAIR) 10 MG tablet Take 10 mg by mouth at bedtime.    Marland Kitchen MYRBETRIQ 50 MG TB24 tablet TAKE 1 TABLET DAILY 90 tablet 0  . ondansetron (ZOFRAN) 4 MG tablet Take 1 tablet (4 mg total) by mouth 2 (two) times daily as needed for nausea or vomiting. 15 tablet 0  . Probiotic Product (PROBIOTIC PO) Take 1 capsule by mouth 2 (two) times daily.     . vitamin E 400 UNIT capsule Take 400 Units by mouth daily.    . OMEGA-3 KRILL OIL PO Take by mouth daily.      No current facility-administered medications for this visit.     Family History  Problem Relation Age of Onset  . Spina bifida Son   . Cancer Paternal Uncle        ? type  . Hypertension Brother   . Heart disease Brother        bypass surgery  . Thyroid disease Daughter   . Thyroid disease Sister   . Breast cancer Neg Hx     Review of Systems  Constitutional: Negative.   HENT: Negative.   Eyes:  Negative.   Respiratory: Negative.   Cardiovascular: Negative.   Gastrointestinal: Negative.   Endocrine: Negative.   Genitourinary: Negative.   Musculoskeletal: Negative.   Skin: Negative.   Allergic/Immunologic: Negative.   Neurological: Negative.  Hematological: Negative.   Psychiatric/Behavioral: Negative.     Exam:   BP 112/70 (BP Location: Right Arm, Patient Position: Sitting, Cuff Size: Normal)   Pulse 76   Temp (!) 97.4 F (36.3 C) (Temporal)   Resp 12   Ht 5' 1.5" (1.562 m)   Wt 129 lb (58.5 kg)   LMP 12/23/1989   BMI 23.98 kg/m     Height: 5' 1.5" (156.2 cm)  Ht Readings from Last 3 Encounters:  05/14/19 5' 1.5" (1.562 m)  01/18/19 5' 1.5" (1.562 m)  01/14/19 5' 1.5" (1.562 m)    General appearance: alert, cooperative and appears stated age Head: Normocephalic, without obvious abnormality, atraumatic Neck: no adenopathy, supple, symmetrical, trachea midline and thyroid normal to inspection and palpation Lungs: clear to auscultation bilaterally Breasts: normal appearance, no masses or tenderness Heart: regular rate and rhythm Abdomen: soft, non-tender; bowel sounds normal; no masses,  no organomegaly Extremities: extremities normal, atraumatic, no cyanosis or edema Skin: Skin color, texture, turgor normal. No rashes or lesions Lymph nodes: Cervical, supraclavicular, and axillary nodes normal. No abnormal inguinal nodes palpated Neurologic: Grossly normal  Pelvic: External genitalia:  no lesions              Urethra:  normal appearing urethra with no masses, tenderness or lesions              Bartholins and Skenes: normal                 Vagina: normal appearing vagina with normal color and discharge, no lesions              Cervix: no lesions              Pap taken: No. Bimanual Exam:  Uterus:  normal size, contour, position, consistency, mobility, non-tender              Adnexa: normal adnexa and no mass, fullness, tenderness               Rectovaginal:  Confirms               Anus:  normal sphincter tone, no lesions  Chaperone was present for exam.  A:  Well Woman with normal exam PMP, no HT OAB H/o Spinal stenosis Osteoporosis  P:   Mammogram guidelines reviewed.  She did this yesterday. pap smear not indicated due to age RF for Myrtetriq 50mg  daily.  #90/4RF RF for Prozac 20mg  daily.  #90/4RF Blood work done with Dr. Lanell Persons Tdap due but declines today.  Discussed reasons to have this done. Shingrix discussed.  She knows this will need to be done at a pharmacy but does not want to go to a pharmacy at this time.  Agree with her at this time. Colonoscopy 12/2017 return annually or prn

## 2019-05-19 ENCOUNTER — Ambulatory Visit: Payer: Medicare Other

## 2019-10-05 ENCOUNTER — Ambulatory Visit (INDEPENDENT_AMBULATORY_CARE_PROVIDER_SITE_OTHER): Payer: Medicare Other

## 2019-10-05 ENCOUNTER — Encounter: Payer: Self-pay | Admitting: Physician Assistant

## 2019-10-05 ENCOUNTER — Ambulatory Visit (INDEPENDENT_AMBULATORY_CARE_PROVIDER_SITE_OTHER): Payer: Medicare Other | Admitting: Physician Assistant

## 2019-10-05 ENCOUNTER — Other Ambulatory Visit: Payer: Self-pay

## 2019-10-05 VITALS — BP 139/64 | HR 66 | Temp 97.0°F | Ht 61.5 in | Wt 128.0 lb

## 2019-10-05 DIAGNOSIS — G8929 Other chronic pain: Secondary | ICD-10-CM

## 2019-10-05 DIAGNOSIS — M545 Low back pain: Secondary | ICD-10-CM

## 2019-10-05 DIAGNOSIS — M810 Age-related osteoporosis without current pathological fracture: Secondary | ICD-10-CM

## 2019-10-05 MED ORDER — TRAMADOL HCL 50 MG PO TABS: 50.0000 mg | ORAL_TABLET | Freq: Four times a day (QID) | ORAL | 0 refills | Status: AC | PRN

## 2019-10-05 MED ORDER — PREDNISONE 10 MG (48) PO TBPK: ORAL_TABLET | ORAL | 0 refills | Status: DC

## 2019-10-06 ENCOUNTER — Encounter: Payer: Self-pay | Admitting: Physician Assistant

## 2019-10-06 ENCOUNTER — Other Ambulatory Visit: Payer: Medicare Other

## 2019-10-06 NOTE — Progress Notes (Signed)
BP 139/64   Pulse 66   Temp (!) 97 F (36.1 C) (Temporal)   Ht 5' 1.5" (1.562 m)   Wt 128 lb (58.1 kg)   LMP 12/23/1989   BMI 23.79 kg/m    Subjective:    Patient ID: [name], female    DOB: Oct 28, 1935, 84 y.o.   MRN: [id number]  HPI: [name] is a 84 y.o. female presenting on 10/05/2019 for Back Pain  In reviewing the chart this patient is under the care of Beckey Rutter, MD.  I will forward this note and information to make her aware.  It appears that the patient will often use our office for urgent care purposes.  In the past this was allowed but we are not trying to do this anymore.  However the appointment did go ahead and get scheduled so working to go ahead and see her today.  The patient complains of back pain.  She is accompanied by her husband today.  She states for many years she has had back issues.  This episode has lasted about 7 days.  She has tried taking a little bit of ibuprofen but not for very long.  She has taken 200 mg twice daily for just a couple of days.  She did not notice any difference.  She tried some lidocaine cream that her husband had.  She also tried icy help.  Neither of these were helping.  She has tried heat and ice.  She states that it hurts even when she is still bothers her when she is walking.  It has caused her to walk much more slightly.  In reviewing her chart she does have degenerative disc disease and spinal stenosis and osteoarthritis.  She also has osteoporosis.  I was concerned about the possibility of a compression fracture that may have began spontaneously.  We will have x-ray performed today and will be sent to radiology for reading.  In reviewing recent visits the patient is currently still taking treatment for a bronchitis.  She was placed on Biaxin suspension.  Past Medical History:  Diagnosis Date  . Anxiety   . Bronchiectasis without complication [facility])    pulmologist-  dr Soledad Gerlach [facility])-- last chest CT 11/  2017;  last exacerbation possible pneumonia 11/ 2017  resolved per pt  . Cataract of both eyes    mature  . Chronic cough    NON-PRODUCTIVE  . DDD (degenerative disc disease), cervical   . Depression   . GERD (gastroesophageal reflux disease)    watches diet very carefully and takes zantac/gaviscon prn  . Grade III internal hemorrhoids   . History of Clostridium difficile colitis    2012-- resolved  . History of concussion    1973 approx--  no residual  . History of iron deficiency anemia   . History of recurrent pneumonia    last bout possible bacterial 11/ 2017   . IBS (irritable bowel syndrome)   . Mixed stress and urge urinary incontinence   . Moderate persistent asthma    pulmologist-  dr Soledad Gerlach [facility])    . OA (osteoarthritis)    hands, knees  . OAB (overactive bladder)   . OAB (overactive bladder)   . Osteopenia   . Renal cyst, left    simple-- per urologist note (dr Alona Bene, [facility])  . Seasonal allergic rhinitis   . Wears glasses   . Wears hearing aid    bilateral---  left ear  is better than right   Relevant past medical, surgical, family and social history reviewed and updated as indicated. Interim medical history since our last visit reviewed. Allergies and medications reviewed and updated. DATA REVIEWED: CHART IN EPIC  Family History reviewed for pertinent findings.  Review of Systems  Constitutional: Negative.   HENT: Negative.   Eyes: Negative.   Respiratory: Negative.   Gastrointestinal: Negative.   Genitourinary: Negative.   Musculoskeletal: Positive for arthralgias, back pain, gait problem, joint swelling and myalgias.  Neurological: Negative for tremors and weakness.    Allergies as of 10/05/2019      Reactions   Chocolate Flavor Nausea Only   Lactose Nausea Only   Latex Itching   Skin reddness Skin reddness   Penicillins Other (See Comments)   Arthritis hands/feet   Statins Other (See Comments)   Tylenol With Codeine #3  [acetaminophen-codeine] Other (See Comments)   Nausea, dizziness   Other Palpitations, Other (See Comments)   Pain meds make her shaky, several years ago.      Medication List       Accurate as of October 05, 2019 11:59 PM. If you have any questions, ask your nurse or doctor.        azelastine 0.1 % nasal spray Commonly known as: ASTELIN Place 2 sprays into both nostrils 2 (two) times daily. Use in each nostril as directed   CALCIUM 1000 + D PO Take 1 capsule by mouth 2 (two) times daily.   cetirizine 10 MG tablet Commonly known as: ZYRTEC Take 10 mg by mouth as needed for allergies.   chlorpheniramine 4 MG tablet Commonly known as: CHLOR-TRIMETON Take 4 mg by mouth every 4 (four) hours as needed for allergies.   FLUoxetine 20 MG capsule Commonly known as: PROZAC Take 1 capsule (20 mg total) by mouth daily.   fluticasone 50 MCG/ACT nasal spray Commonly known as: FLONASE Place 2 sprays into the nose 2 (two) times daily.   folic acid Q000111Q MCG tablet Commonly known as: FOLVITE Take 800 mcg by mouth daily.   GAVISCON EXTRA STRENGTH PO Take 1 tablet by mouth as needed.   hypromellose 0.3 % Gel ophthalmic ointment Commonly known as: GENTEAL Place 1 application into both eyes as needed (dry eyes).   ibuprofen 200 MG tablet Commonly known as: ADVIL Take 200 mg by mouth every 6 (six) hours as needed.   Magnesium 400 MG Caps Take 1 capsule by mouth daily.   mirabegron ER 50 MG Tb24 tablet Commonly known as: Myrbetriq Take 1 tablet (50 mg total) by mouth daily.   montelukast 10 MG tablet Commonly known as: SINGULAIR Take 10 mg by mouth at bedtime.   OMEGA-3 KRILL OIL PO Take by mouth daily.   ondansetron 4 MG tablet Commonly known as: Zofran Take 1 tablet (4 mg total) by mouth 2 (two) times daily as needed for nausea or vomiting.   predniSONE 10 MG (48) Tbpk tablet Commonly known as: STERAPRED UNI-PAK 48 TAB Take as directed for 12 days Started by: Terald Sleeper, PA-C   ProAir HFA 108 [id number] Base) MCG/ACT inhaler Generic drug: albuterol Inhale into the lungs every 6 (six) hours as needed for wheezing or shortness of breath.   PROBIOTIC PO Take 1 capsule by mouth 2 (two) times daily.   traMADol 50 MG tablet Commonly known as: ULTRAM Take 1 tablet (50 mg total) by mouth every 6 (six) hours as needed for up to 5 days. Started by: Terald Sleeper,  PA-C   Vitamin B-12 2500 MCG Subl Place under the tongue daily.   Vitamin D 50 MCG (2000 UT) Caps Take 1 capsule by mouth every morning.   vitamin E 400 UNIT capsule Take 400 Units by mouth daily.          Objective:    BP 139/64   Pulse 66   Temp (!) 97 F (36.1 C) (Temporal)   Ht 5' 1.5" (1.562 m)   Wt 128 lb (58.1 kg)   LMP 12/23/1989   BMI 23.79 kg/m   Allergies  Allergen Reactions  . Chocolate Flavor Nausea Only  . Lactose Nausea Only  . Latex Itching    Skin reddness Skin reddness  . Penicillins Other (See Comments)    Arthritis hands/feet  . Statins Other (See Comments)  . Tylenol With Codeine #3 [Acetaminophen-Codeine] Other (See Comments)    Nausea, dizziness  . Other Palpitations and Other (See Comments)    Pain meds make her shaky, several years ago.    Wt Readings from Last 3 Encounters:  10/05/19 128 lb (58.1 kg)  05/14/19 129 lb (58.5 kg)  01/18/19 126 lb 6.4 oz (57.3 kg)    Physical Exam Constitutional:      General: She is not in acute distress.    Appearance: Normal appearance. She is well-developed.  HENT:     Head: Normocephalic and atraumatic.  Cardiovascular:     Rate and Rhythm: Normal rate.  Pulmonary:     Effort: Pulmonary effort is normal.  Musculoskeletal:     Lumbar back: She exhibits decreased range of motion, tenderness, pain and spasm.       Back:  Skin:    General: Skin is warm and dry.     Findings: No rash.  Neurological:     Mental Status: She is alert and oriented to person, place, and time.     Deep Tendon Reflexes:  Reflexes are normal and symmetric.         Assessment & Plan:   1. Chronic right-sided low back pain without sciatica - DG Lumbar Spine 2-3 Views; Future - predniSONE (STERAPRED UNI-PAK 48 TAB) 10 MG (48) TBPK tablet; Take as directed for 12 days  Dispense: 48 tablet; Refill: 0 - traMADol (ULTRAM) 50 MG tablet; Take 1 tablet (50 mg total) by mouth every 6 (six) hours as needed for up to 5 days.  Dispense: 20 tablet; Refill: 0  2. Age related osteoporosis, unspecified pathological fracture presence X-rays performed to evaluate lumbar spine    Addendum: Patient was asked to come back in for another review.  Radiologist reached out to our radiology tech to have her come in for another view.  We are waiting for that patient to come in and have this performed.  Continue all other maintenance medications as listed above.  Follow up plan: No follow-ups on file. Asked patient to have follow-up with her PCP.  Also advised her to continue with any future orthopedic plans.  Terald Sleeper PA-C [place] [address]  [place], [place] [address] [phone]   10/06/2019, 2:56 PM

## 2019-10-07 NOTE — Progress Notes (Signed)
Sent copy of Note to [facility].

## 2019-11-04 ENCOUNTER — Ambulatory Visit: Payer: Medicare Other | Attending: Internal Medicine

## 2019-11-04 ENCOUNTER — Other Ambulatory Visit: Payer: Self-pay

## 2019-11-04 DIAGNOSIS — R4701 Aphasia: Secondary | ICD-10-CM | POA: Diagnosis not present

## 2019-11-05 NOTE — Patient Instructions (Signed)
  Please complete the assigned speech therapy homework prior to your next session and return it to the speech therapist at your next visit.  (anomia homework)

## 2019-11-05 NOTE — Therapy (Signed)
[facility] [address] [place], [place], [address] Phone: [phone]   Fax:  [phone]  Speech Language Pathology Evaluation  Patient Details  Name: [name] MRN: [id number] Date of Birth: 1935/03/29 Referring Provider (SLP): Sherri Sear, [place]   Encounter Date: 11/04/2019  End of Session - 11/05/19 [id number]    Visit Number  1    Number of Visits  17    Date for SLP Re-Evaluation  02/02/20   90 days   SLP Start Time  41    SLP Stop Time   1400    SLP Time Calculation (min)  40 min    Activity Tolerance  Patient tolerated treatment well       Past Medical History:  Diagnosis Date  . Anxiety   . Bronchiectasis without complication [facility])    pulmologist-  dr Soledad Gerlach [facility])-- last chest CT 11/ 2017;  last exacerbation possible pneumonia 11/ 2017  resolved per pt  . Cataract of both eyes    mature  . Chronic cough    NON-PRODUCTIVE  . DDD (degenerative disc disease), cervical   . Depression   . GERD (gastroesophageal reflux disease)    watches diet very carefully and takes zantac/gaviscon prn  . Grade III internal hemorrhoids   . History of Clostridium difficile colitis    2012-- resolved  . History of concussion    1973 approx--  no residual  . History of iron deficiency anemia   . History of recurrent pneumonia    last bout possible bacterial 11/ 2017   . IBS (irritable bowel syndrome)   . Mixed stress and urge urinary incontinence   . Moderate persistent asthma    pulmologist-  dr Soledad Gerlach [facility])    . OA (osteoarthritis)    hands, knees  . OAB (overactive bladder)   . OAB (overactive bladder)   . Osteopenia   . Renal cyst, left    simple-- per urologist note (dr Alona Bene, [facility])  . Seasonal allergic rhinitis   . Wears glasses   . Wears hearing aid    bilateral---  left ear is better than right    Past Surgical History:  Procedure Laterality Date  . BREAST BIOPSY  Right yrs ago   benign  . CARDIOVASCULAR STRESS TEST  10/12/2003   normal nuclear study w/ no ischemia/  normal LV function and wall motion, ef 75%  . ENDOVENOUS ABLATION SAPHENOUS VEIN W/ LASER Left 12-08-2013   left greater saphenous vein and sclerotherapy left leg by Curt Jews MD  . EVALUATION UNDER ANESTHESIA WITH HEMORRHOIDECTOMY  05/10/2005  . HEMORRHOID SURGERY N/A 01/01/2017   Procedure: SINGLE COLUMN HEMORRHOIDECTOMY;  Surgeon: Leighton Ruff, MD;  Location: [facility];  Service: General;  Laterality: N/A;  . MASS EXCISION  01/2018   left wrist--path pending  . TRANSTHORACIC ECHOCARDIOGRAM  04-26-2013  [facility])   ef 55-60%/  trivial AR/  mild MR and TR/  RVSF pressure 49mmHg/  no evidence mvp , normal structure and function  . TUBAL LIGATION Bilateral 1992    There were no vitals filed for this visit.  Subjective Assessment - 11/04/19 1326    Subjective  "Yah I noticed it about a year ago, not finding my words. And then  - - just, more and more."    Patient is accompained by:  Family member   Renee, dtr   Currently in Pain?  No/denies  SLP Evaluation [facility] - 11/05/19 0001      SLP Visit Information   SLP Received On  11/04/19    Referring Provider (SLP)  Gara Kroner Meritt, [facility]    Onset Date  2019    Medical Diagnosis  PPA      Subjective   Subjective  "I have a hearing aid in but  - bard."    Patient/Family Stated Goal  "Get an idea of what we are going to do for this. I will work hard for that."      Pain Assessment   Currently in Pain?  No/denies      Prior Functional Status   Cognitive/Linguistic Baseline  Within functional limits     Lives With  Spouse    Vocation  Retired      Associate Professor   Overall Cognitive Status  Within Functional Limits for tasks assessed      Auditory Comprehension   Overall Auditory Comprehension Comments  Pt's auditory comprehension did not appear notably disordered in conversation, however testing showed  that it is, SLP believes mainly due to pt's difficulty with a certain TYPE of direction ("With the X, point to the X"). Due to this, SLP believes pt's auditory comprehensionscore was depressed.      Verbal Expression   Overall Verbal Expression  Impaired    Repetition  --   minimal impairment   Naming  Impairment    Confrontation  50-74% accurate    Effective Techniques  Phonemic cues   heavy cueing was needed; limited A with semantic cue   Other Verbal Expression Comments  Pt attempted to use synonym and circumlocution cues - sometimes successfully. Simple pleasantries were completed very well and as need for linguistic compelxity increased, pt demonstrated more difficulty with language fluidity and depth. Pt generally appeared aware of her errors. In fact, in picture description on WAB pt whispered, "flau, flau, flau, no, (4 seconds) flag!"       Oral Motor/Sensory Function   Overall Oral Motor/Sensory Function  Appears within functional limits for tasks assessed      Motor Speech   Overall Motor Speech  Appears within functional limits for tasks assessed                      SLP Education - 11/05/19 0831    Education Details  Did MD provide info on PPA? (yes), possible goals, need to use compensations    Person(s) Educated  Patient;Child(ren)    Methods  Explanation    Comprehension  Verbalized understanding       SLP Short Term Goals - 11/05/19 0836      SLP SHORT TERM GOAL #1   Title  Pt will indicate knowledge of /name the variety of compensatory measures she can incorporate to improve her spoken communication    Time  4    Period  Weeks    Status  New      SLP SHORT TERM GOAL #2   Title  pt will demo ability to use compensations to generate functional 5 minutes of mod complex communcation    Time  4    Period  Weeks    Status  New      SLP SHORT TERM GOAL #3   Title  pt will complete aphasia testing in 1-2 sessions    Time  2    Period  --    sessions   Status  New  SLP Long Term Goals - 11/05/19 0839      SLP LONG TERM GOAL #1   Title  Pt will use compensatory measures successfully in order to participate functionally in 10 minutes of moderately complex conversation over four sessions    Time  8   or 17 sessions, for all LTGs   Period  Weeks    Status  New      SLP LONG TERM GOAL #2   Title  pt will have a plan for successful and functional communication interactions as PPA continues to decline (low-tech, high-tech, etc)    Time  8    Period  Weeks    Status  New       Plan - 11/05/19 [id number]    Clinical Impression Statement  Pt presents with at least mild-mod expressive aphasia with possible receptive component. Language difficulty began approx 12-14 months ago and has had gradual decline over that time. Pt's GNP dx'ed pt with PPA; this SLP agrees with that diagnosis. Aphasia testing will be completed in next 1-2 sessions - may be necessary to assess pt's reading and writing skills as well. Pt would benefit from skilled ST to salvage/bolster remaining linguistic ability, to hone pt's ability with use of compensations, and to plan for future communication needs.    Speech Therapy Frequency  2x / week    Duration  --   8 weeks or 17 sessions   Treatment/Interventions  Compensatory techniques;Cueing hierarchy;Language facilitation;Internal/external aids;Patient/family education;SLP instruction and feedback;Multimodal communcation approach;Functional tasks    Potential to Achieve Goals  Fair    Potential Considerations  Medical prognosis    Consulted and Agree with Plan of Care  Patient       Patient will benefit from skilled therapeutic intervention in order to improve the following deficits and impairments:   Aphasia - Plan: SLP plan of care cert/re-cert    Problem List Patient Active Problem List   Diagnosis Date Noted  . Urge incontinence of urine 10/19/2013  . Pain in limb 08/19/2013  . Varicose veins of  lower extremities with other complications [id number]  . DDD (degenerative disc disease), cervical 06/14/2013  . Spinal stenosis of cervical region 06/14/2013  . Depression, major, recurrent, mild ([place]) 05/05/2013  . H/O infectious disease 05/05/2013  . Restless legs syndrome 04/23/2013  . OP (osteoporosis) 04/23/2013  . Asthma, moderate persistent 12/27/2011  . Mixed hyperlipidemia 04/23/2008  . ANEMIA 04/23/2008  . MITRAL VALVE PROLAPSE 04/23/2008  . Seasonal allergic rhinitis 04/23/2008  . PNEUMONIA 04/23/2008    Abcde Oneil ,[place], [place]  11/05/2019, 8:46 AM  [facility] [address] [place], [place], [address] Phone: [phone]   Fax:  [phone]  Name: [name] MRN: [id number] Date of Birth: 11-Jul-1935

## 2019-11-09 ENCOUNTER — Ambulatory Visit: Payer: Medicare Other

## 2019-11-09 ENCOUNTER — Other Ambulatory Visit: Payer: Self-pay

## 2019-11-09 DIAGNOSIS — R4701 Aphasia: Secondary | ICD-10-CM | POA: Diagnosis not present

## 2019-11-09 NOTE — Patient Instructions (Signed)
   Compensatory Strategies for naming - USE THESE!  Verbal- Describe it Say it a different way - "circumlocution", "rephrasing" Use a synonym  Nonverbal Gestures Drawing  ===================== Talking is your best practice right now! In these times, we need to be more intentional and proactive about communication- use the phone more often, or a video-phone such as "facetime" or "skype", or "zoom" for meetings

## 2019-11-09 NOTE — Therapy (Signed)
[facility] [address] [place], [place], [address] Phone: [phone]   Fax:  [phone]  Speech Language Pathology Treatment  Patient Details  Name: [name] MRN: [id number] Date of Birth: 06/25/1935 Referring Provider (SLP): Sherri Sear, [place]   Encounter Date: 11/09/2019  End of Session - 11/09/19 1413    Visit Number  2    Number of Visits  17    Date for SLP Re-Evaluation  02/02/20    SLP Start Time  56    SLP Stop Time   1401    SLP Time Calculation (min)  43 min    Activity Tolerance  Patient tolerated treatment well       Past Medical History:  Diagnosis Date  . Anxiety   . Bronchiectasis without complication [facility])    pulmologist-  dr Soledad Gerlach [facility])-- last chest CT 11/ 2017;  last exacerbation possible pneumonia 11/ 2017  resolved per pt  . Cataract of both eyes    mature  . Chronic cough    NON-PRODUCTIVE  . DDD (degenerative disc disease), cervical   . Depression   . GERD (gastroesophageal reflux disease)    watches diet very carefully and takes zantac/gaviscon prn  . Grade III internal hemorrhoids   . History of Clostridium difficile colitis    2012-- resolved  . History of concussion    1973 approx--  no residual  . History of iron deficiency anemia   . History of recurrent pneumonia    last bout possible bacterial 11/ 2017   . IBS (irritable bowel syndrome)   . Mixed stress and urge urinary incontinence   . Moderate persistent asthma    pulmologist-  dr Soledad Gerlach [facility])    . OA (osteoarthritis)    hands, knees  . OAB (overactive bladder)   . OAB (overactive bladder)   . Osteopenia   . Renal cyst, left    simple-- per urologist note (dr Alona Bene, [facility])  . Seasonal allergic rhinitis   . Wears glasses   . Wears hearing aid    bilateral---  left ear is better than right    Past Surgical History:  Procedure Laterality Date  . BREAST BIOPSY Right yrs ago    benign  . CARDIOVASCULAR STRESS TEST  10/12/2003   normal nuclear study w/ no ischemia/  normal LV function and wall motion, ef 75%  . ENDOVENOUS ABLATION SAPHENOUS VEIN W/ LASER Left 12-08-2013   left greater saphenous vein and sclerotherapy left leg by Curt Jews MD  . EVALUATION UNDER ANESTHESIA WITH HEMORRHOIDECTOMY  05/10/2005  . HEMORRHOID SURGERY N/A 01/01/2017   Procedure: SINGLE COLUMN HEMORRHOIDECTOMY;  Surgeon: Leighton Ruff, MD;  Location: [facility];  Service: General;  Laterality: N/A;  . MASS EXCISION  01/2018   left wrist--path pending  . TRANSTHORACIC ECHOCARDIOGRAM  04-26-2013  [facility])   ef 55-60%/  trivial AR/  mild MR and TR/  RVSF pressure 74mmHg/  no evidence mvp , normal structure and function  . TUBAL LIGATION Bilateral 1992    There were no vitals filed for this visit.  Subjective Assessment - 11/09/19 1322    Subjective  "I failed to put my hearing aid in this morning."    Currently in Pain?  No/denies            ADULT SLP TREATMENT - 11/09/19 1323      General Information   Behavior/Cognition  Alert;Cooperative;Pleasant mood  Treatment Provided   Treatment provided  Cognitive-Linquistic      Cognitive-Linquistic Treatment   Treatment focused on  Aphasia    Skilled Treatment  SLP completed the Western Aphasia Battery - Revised. SLP asked pt what was most challenging aspect of the test - pt req'd repeat of the question. SLP introduced compensaatory strategies to pt - describe, circumlocution ("rephrase"), and synonym, and nonverbal means of gesturing and drawing. SLP made pt aware that any of these could and should be used by her to communiate. Pt shared her husband has more or less made it known he will not be available to assist pt and pt does not plan to do much speaking with him as pt indicates he is having a hard time dealing with pt's condition. SLP was thankful for this information as it will be necessary for pt to find some  people to assist her with homework and generally, intermittently in conversation. SLP talked about some other options for verbal communication during the pandemic such as telephone, "facetime", "skype", and "zoom" for meetings. Pt stated she would use the phone more often as SLP told her talking is her best practice using the compensatory strategies.      Assessment / Recommendations / Plan   Plan  Continue with current plan of care      Progression Toward Goals   Progression toward goals  Progressing toward goals       SLP Education - 11/09/19 1412    Education Details  compensatory techniques for expressive aphasia, talking is an important aspect for practice/maintenance    Person(s) Educated  Patient    Methods  Explanation;Handout;Demonstration    Comprehension  Verbalized understanding;Returned demonstration;Verbal cues required;Need further instruction       SLP Short Term Goals - 11/09/19 1427      SLP SHORT TERM GOAL #1   Title  Pt will indicate knowledge of /name the variety of compensatory measures she can incorporate to improve her spoken communication    Time  4    Period  Weeks    Status  On-going      SLP SHORT TERM GOAL #2   Title  pt will demo ability to use compensations to generate functional 5 minutes of mod complex communcation    Time  4    Period  Weeks    Status  On-going      SLP SHORT TERM GOAL #3   Title  pt will complete aphasia testing in 1-2 sessions    Time  2    Period  --   sessions   Status  On-going       SLP Long Term Goals - 11/09/19 1427      SLP LONG TERM GOAL #1   Title  Pt will use compensatory measures successfully in order to participate functionally in 10 minutes of moderately complex conversation over four sessions    Time  8   or 17 sessions, for all LTGs   Period  Weeks    Status  On-going      SLP LONG TERM GOAL #2   Title  pt will have a plan for successful and functional communication interactions as PPA continues to  decline (low-tech, high-tech, etc)    Time  8    Period  Weeks    Status  On-going       Plan - 11/09/19 1413    Clinical Impression Statement  Western Aphasia Battery -Revised was completed today with mild-mod  anomic aphasia ID'd. She did not bring her hearing aid today and SLP strongly encouraged pt to wear it next session. As she mentioned difficulty with written expression, it may be necessary to assess pt's reading and writing skills as well. Pt would benefit from skilled ST to salvage/bolster remaining linguistic ability, to hone pt's ability with use of compensations, and to plan for future communication needs.    Speech Therapy Frequency  2x / week    Duration  --   8 weeks or 17 sessions   Treatment/Interventions  Compensatory techniques;Cueing hierarchy;Language facilitation;Internal/external aids;Patient/family education;SLP instruction and feedback;Multimodal communcation approach;Functional tasks    Potential to Achieve Goals  Fair    Potential Considerations  Medical prognosis    Consulted and Agree with Plan of Care  Patient       Patient will benefit from skilled therapeutic intervention in order to improve the following deficits and impairments:   Aphasia    Problem List Patient Active Problem List   Diagnosis Date Noted  . Urge incontinence of urine 10/19/2013  . Pain in limb 08/19/2013  . Varicose veins of lower extremities with other complications [id number]  . DDD (degenerative disc disease), cervical 06/14/2013  . Spinal stenosis of cervical region 06/14/2013  . Depression, major, recurrent, mild ([place]) 05/05/2013  . H/O infectious disease 05/05/2013  . Restless legs syndrome 04/23/2013  . OP (osteoporosis) 04/23/2013  . Asthma, moderate persistent 12/27/2011  . Mixed hyperlipidemia 04/23/2008  . ANEMIA 04/23/2008  . MITRAL VALVE PROLAPSE 04/23/2008  . Seasonal allergic rhinitis 04/23/2008  . PNEUMONIA 04/23/2008    Sylvanus Telford ,[place],  CCC-SLP  11/09/2019, 2:28 PM  [facility] [address] [place], [place], [address] Phone: [phone]   Fax:  [phone]   Name: [name] MRN: [id number] Date of Birth: 05/02/1935

## 2019-11-15 ENCOUNTER — Other Ambulatory Visit: Payer: Self-pay

## 2019-11-15 ENCOUNTER — Ambulatory Visit: Payer: Medicare Other | Admitting: Speech Pathology

## 2019-11-15 DIAGNOSIS — R4701 Aphasia: Secondary | ICD-10-CM

## 2019-11-15 NOTE — Patient Instructions (Addendum)
   Little Words = Big Problem  This is a bend in the road, not the end of the road   Tips for Talking with People who have Aphasia  . Say one thing at a time . Don't  rush - slow down, be patient . Talk face to face . Reduce background noise . Relax - be natural . Use pen and paper . Write down key words . Draw diagrams or pictures . Don't pretend you understand . Ask what helps . Recap - check you both understand . Be a partner, not a therapist   Aphasia does not affect intelligence, only language. The person with aphasia can still: make decisions, have opinions, and socialize.   Describing words  What group does it belong to?  What do I use it for?  Where can I find it?  What does it LOOK like?  What other words go with it?  What is the 1st sound of the word?          Many Ways to Communicate  Describe it Write it Draw it Gesture it Use related words    Provided by: [name], [phone]

## 2019-11-22 ENCOUNTER — Encounter: Payer: Self-pay | Admitting: Speech Pathology

## 2019-11-22 ENCOUNTER — Other Ambulatory Visit: Payer: Self-pay

## 2019-11-22 ENCOUNTER — Ambulatory Visit: Payer: Medicare Other | Admitting: Speech Pathology

## 2019-11-22 DIAGNOSIS — R4701 Aphasia: Secondary | ICD-10-CM

## 2019-11-22 NOTE — Therapy (Signed)
[facility] [address] [place], [place], [address] Phone: [phone]   Fax:  [phone]  Speech Language Pathology Treatment  Patient Details  Name: [name] MRN: [id number] Date of Birth: 09/20/1935 Referring Provider (SLP): Sherri Sear, [place]   Encounter Date: 11/15/2019  End of Session - 11/22/19 0827    Visit Number  3    Number of Visits  17    Date for SLP Re-Evaluation  02/02/20    SLP Start Time  1316    SLP Stop Time   1358    SLP Time Calculation (min)  42 min    Activity Tolerance  Patient tolerated treatment well       Past Medical History:  Diagnosis Date  . Anxiety   . Bronchiectasis without complication [facility])    pulmologist-  dr Soledad Gerlach [facility])-- last chest CT 11/ 2017;  last exacerbation possible pneumonia 11/ 2017  resolved per pt  . Cataract of both eyes    mature  . Chronic cough    NON-PRODUCTIVE  . DDD (degenerative disc disease), cervical   . Depression   . GERD (gastroesophageal reflux disease)    watches diet very carefully and takes zantac/gaviscon prn  . Grade III internal hemorrhoids   . History of Clostridium difficile colitis    2012-- resolved  . History of concussion    1973 approx--  no residual  . History of iron deficiency anemia   . History of recurrent pneumonia    last bout possible bacterial 11/ 2017   . IBS (irritable bowel syndrome)   . Mixed stress and urge urinary incontinence   . Moderate persistent asthma    pulmologist-  dr Soledad Gerlach [facility])    . OA (osteoarthritis)    hands, knees  . OAB (overactive bladder)   . OAB (overactive bladder)   . Osteopenia   . Renal cyst, left    simple-- per urologist note (dr Alona Bene, [facility])  . Seasonal allergic rhinitis   . Wears glasses   . Wears hearing aid    bilateral---  left ear is better than right    Past Surgical History:  Procedure Laterality Date  . BREAST BIOPSY Right yrs ago    benign  . CARDIOVASCULAR STRESS TEST  10/12/2003   normal nuclear study w/ no ischemia/  normal LV function and wall motion, ef 75%  . ENDOVENOUS ABLATION SAPHENOUS VEIN W/ LASER Left 12-08-2013   left greater saphenous vein and sclerotherapy left leg by Curt Jews MD  . EVALUATION UNDER ANESTHESIA WITH HEMORRHOIDECTOMY  05/10/2005  . HEMORRHOID SURGERY N/A 01/01/2017   Procedure: SINGLE COLUMN HEMORRHOIDECTOMY;  Surgeon: Leighton Ruff, MD;  Location: [facility];  Service: General;  Laterality: N/A;  . MASS EXCISION  01/2018   left wrist--path pending  . TRANSTHORACIC ECHOCARDIOGRAM  04-26-2013  [facility])   ef 55-60%/  trivial AR/  mild MR and TR/  RVSF pressure 34mmHg/  no evidence mvp , normal structure and function  . TUBAL LIGATION Bilateral 1992    There were no vitals filed for this visit.  Subjective Assessment - 11/22/19 0816    Subjective  "My husband calls it dementia, but I prefer to call it this" Pt holds up booklet on FTD            ADULT SLP TREATMENT - 11/22/19 0817      General Information   Behavior/Cognition  Alert;Cooperative;Pleasant mood  Patient Positioning  Upright in chair      Treatment Provided   Treatment provided  Cognitive-Linquistic      Cognitive-Linquistic Treatment   Treatment focused on  Aphasia    Skilled Treatment  Initiated training on compensations for aphasia using Semantic Feature Analysis (SFA) as structued guide for verbal compensations. Pt completed SFA chart for 4 nouns (photos)  with usual min questioning cues. She required mod questioning cues to carryover into conversations. Educated pt that treatment will focus on rehabilitiation and maintenece as well as initiating some non verbal communication strategies to practice as she will need these as the PPA progresses. We discussed pt coming up with a short, rote explanation of her PPA she can share with her friends and family. An example was provided in a handout.  Introduced strategy of having her write down things she wants to say on phone calls with family or business calls, to ease the stress of having to find the right words. Pt reported she has done this 1-2 times. Provided aphasia ID cards      Assessment / Recommendations / Plan   Plan  Continue with current plan of care      Progression Toward Goals   Progression toward goals  Progressing toward goals       SLP Education - 11/22/19 [phone]    Education Details  compensations for aphasia; educating family and friends re: PPA    Person(s) Educated  Patient    Methods  Explanation;Handout;Demonstration    Comprehension  Verbalized understanding;Returned demonstration;Verbal cues required;Need further instruction       SLP Short Term Goals - 11/22/19 0826      SLP SHORT TERM GOAL #1   Title  Pt will indicate knowledge of /name the variety of compensatory measures she can incorporate to improve her spoken communication    Time  3    Period  Weeks    Status  On-going      SLP SHORT TERM GOAL #2   Title  pt will demo ability to use compensations to generate functional 5 minutes of mod complex communcation    Time  3    Period  Weeks    Status  On-going      SLP SHORT TERM GOAL #3   Title  pt will complete aphasia testing in 1-2 sessions    Time  2    Period  --   sessions   Status  Achieved       SLP Long Term Goals - 11/22/19 [id number]      SLP LONG TERM GOAL #1   Title  Pt will use compensatory measures successfully in order to participate functionally in 10 minutes of moderately complex conversation over four sessions    Time  7   or 17 sessions, for all LTGs   Period  Weeks    Status  On-going      SLP LONG TERM GOAL #2   Title  pt will have a plan for successful and functional communication interactions as PPA continues to decline (low-tech, high-tech, etc)    Time  7    Period  Weeks    Status  On-going       Plan - 11/22/19 0824    Clinical Impression Statement   Initiated training in verbal and non verbal compensations for aphasia with mod A. Education re: PPA is ongoing. Initiated pt strategies for training her friends and family in assisting her communication. Continue skilled ST to  maximize communication as well as train pt in non verbal communication strategies for when PPA progresses.    Speech Therapy Frequency  2x / week    Duration  --   8 weeks or 17 visits   Treatment/Interventions  Compensatory techniques;Cueing hierarchy;Language facilitation;Internal/external aids;Patient/family education;SLP instruction and feedback;Multimodal communcation approach;Functional tasks    Potential to Achieve Goals  Fair    Potential Considerations  Medical prognosis       Patient will benefit from skilled therapeutic intervention in order to improve the following deficits and impairments:   Aphasia    Problem List Patient Active Problem List   Diagnosis Date Noted  . Urge incontinence of urine 10/19/2013  . Pain in limb 08/19/2013  . Varicose veins of lower extremities with other complications [id number]  . DDD (degenerative disc disease), cervical 06/14/2013  . Spinal stenosis of cervical region 06/14/2013  . Depression, major, recurrent, mild ([place]) 05/05/2013  . H/O infectious disease 05/05/2013  . Restless legs syndrome 04/23/2013  . OP (osteoporosis) 04/23/2013  . Asthma, moderate persistent 12/27/2011  . Mixed hyperlipidemia 04/23/2008  . ANEMIA 04/23/2008  . MITRAL VALVE PROLAPSE 04/23/2008  . Seasonal allergic rhinitis 04/23/2008  . PNEUMONIA 04/23/2008    Lovvorn, Annye Rusk MS, CCC-SLP 11/22/2019, 8:28 AM  [facility] [address] [place], [place], [address] Phone: [phone]   Fax:  [phone]   Name: [name] MRN: [id number] Date of Birth: May 22, 1935

## 2019-11-22 NOTE — Patient Instructions (Signed)
  Consider a binder with sections for my notes, HW, personal word list etc to keep all of this stuff organized  Rather than saying "I forgot" or "I don't remember it" Say "I know it, but I can't think of the word"  Gestures and drawing are fair ways to communicate as well and you may want to practice this with family in a game to have this skill if you need it  Read  Stress, anxiety, high emotions can block your word finding and make the aphasia worse

## 2019-11-24 ENCOUNTER — Encounter: Payer: Self-pay | Admitting: Speech Pathology

## 2019-11-24 ENCOUNTER — Other Ambulatory Visit: Payer: Self-pay

## 2019-11-24 ENCOUNTER — Ambulatory Visit: Payer: Medicare Other | Attending: Internal Medicine | Admitting: Speech Pathology

## 2019-11-24 DIAGNOSIS — R4701 Aphasia: Secondary | ICD-10-CM | POA: Diagnosis present

## 2019-11-24 DIAGNOSIS — R41841 Cognitive communication deficit: Secondary | ICD-10-CM | POA: Diagnosis present

## 2019-11-24 NOTE — Therapy (Signed)
[facility] [address] [place], [place], [address] Phone: [phone]   Fax:  [phone]  Speech Language Pathology Treatment  Patient Details  Name: [name] MRN: [id number] Date of Birth: 02/01/1935 Referring Provider (SLP): Sherri Sear, [place]   Encounter Date: 11/24/2019  End of Session - 11/24/19 1504    Visit Number  5    Number of Visits  17    Date for SLP Re-Evaluation  02/02/20    SLP Start Time  [id number]    SLP Stop Time   1430    SLP Time Calculation (min)  47 min    Activity Tolerance  Patient tolerated treatment well       Past Medical History:  Diagnosis Date  . Anxiety   . Bronchiectasis without complication [facility])    pulmologist-  dr Soledad Gerlach [facility])-- last chest CT 11/ 2017;  last exacerbation possible pneumonia 11/ 2017  resolved per pt  . Cataract of both eyes    mature  . Chronic cough    NON-PRODUCTIVE  . DDD (degenerative disc disease), cervical   . Depression   . GERD (gastroesophageal reflux disease)    watches diet very carefully and takes zantac/gaviscon prn  . Grade III internal hemorrhoids   . History of Clostridium difficile colitis    2012-- resolved  . History of concussion    1973 approx--  no residual  . History of iron deficiency anemia   . History of recurrent pneumonia    last bout possible bacterial 11/ 2017   . IBS (irritable bowel syndrome)   . Mixed stress and urge urinary incontinence   . Moderate persistent asthma    pulmologist-  dr Soledad Gerlach [facility])    . OA (osteoarthritis)    hands, knees  . OAB (overactive bladder)   . OAB (overactive bladder)   . Osteopenia   . Renal cyst, left    simple-- per urologist note (dr Alona Bene, [facility])  . Seasonal allergic rhinitis   . Wears glasses   . Wears hearing aid    bilateral---  left ear is better than right    Past Surgical History:  Procedure Laterality Date  . BREAST BIOPSY Right yrs ago    benign  . CARDIOVASCULAR STRESS TEST  10/12/2003   normal nuclear study w/ no ischemia/  normal LV function and wall motion, ef 75%  . ENDOVENOUS ABLATION SAPHENOUS VEIN W/ LASER Left 12-08-2013   left greater saphenous vein and sclerotherapy left leg by Curt Jews MD  . EVALUATION UNDER ANESTHESIA WITH HEMORRHOIDECTOMY  05/10/2005  . HEMORRHOID SURGERY N/A 01/01/2017   Procedure: SINGLE COLUMN HEMORRHOIDECTOMY;  Surgeon: Leighton Ruff, MD;  Location: [facility];  Service: General;  Laterality: N/A;  . MASS EXCISION  01/2018   left wrist--path pending  . TRANSTHORACIC ECHOCARDIOGRAM  04-26-2013  [facility])   ef 55-60%/  trivial AR/  mild MR and TR/  RVSF pressure 61mmHg/  no evidence mvp , normal structure and function  . TUBAL LIGATION Bilateral 1992    There were no vitals filed for this visit.  Subjective Assessment - 11/24/19 1452    Subjective  "I talked on the phone to a friend I haven't talked to in a while - I let her know I have problems thinking of some words"    Currently in Pain?  No/denies            ADULT SLP TREATMENT - 11/24/19  [facility]   Behavior/Cognition  Alert;Cooperative;Pleasant mood      Treatment Provided   Treatment provided  Cognitive-Linquistic      Cognitive-Linquistic Treatment   Treatment focused on  Aphasia;Patient/family/caregiver education    Skilled Treatment  Targeted word finding, use of compensations and syntax with Pharmacist, hospital ([facility]). Pt indpendenlty generated 1 subject & object (SVO) for each verb She required occsional min A to generate 2nd SVO and frequent mod verbal cues to generate 3rd SVO. When she encountered word fidnding difficulties, she required usual questioning cues to utilize compensations. [name] independently utilized gestures 2x successfully. I reinforced this as a non verbal compensation and encouraged her to continue to use gestures as compensations for anomia. When  generating complex sentence using "wh" questions, [name] required repetition and verbal cue usually. Divergent naming task of birds in her yard, [name] named 3/10 independently. She benfitted from written 1st letter, semantic cues and fill in the blank written cues to name 7 more birds. In simple conversation, [name] benefitted from cues to be more specific and feedback that I didn't understand, as she used empty speech "He puts the thing in the thing, you know" and again required questioning cues to use descriptions to faciliate  my comprehension of her message      Assessment / Recommendations / [place] with current plan of care      Progression Toward Goals   Progression toward goals  Progressing toward goals       SLP Education - 11/24/19 1502    Education Details  verbal and non verbal compensations for aphasia    Person(s) Educated  Patient    Methods  Explanation;Demonstration;Verbal cues    Comprehension  Verbalized understanding;Returned demonstration;Verbal cues required;Need further instruction       SLP Short Term Goals - 11/24/19 1503      SLP SHORT TERM GOAL #1   Title  Pt will indicate knowledge of /name the variety of compensatory measures she can incorporate to improve her spoken communication    Time  2    Period  Weeks    Status  On-going      SLP SHORT TERM GOAL #2   Title  pt will demo ability to use compensations to generate functional 5 minutes of mod complex communcation    Time  2    Period  Weeks    Status  On-going      SLP SHORT TERM GOAL #3   Title  pt will complete aphasia testing in 1-2 sessions    Time  2    Period  --   sessions   Status  Achieved       SLP Long Term Goals - 11/24/19 1504      SLP LONG TERM GOAL #1   Title  Pt will use compensatory measures successfully in order to participate functionally in 10 minutes of moderately complex conversation over four sessions    Time  6   or 17 sessions, for all LTGs   Period  Weeks     Status  On-going      SLP LONG TERM GOAL #2   Title  pt will have a plan for successful and functional communication interactions as PPA continues to decline (low-tech, high-tech, etc)    Time  6    Period  Weeks    Status  On-going       Plan - 11/24/19  1503    Clinical Impression Statement  Ongoing training in verbal and non verbal compensations for aphasia with mod A. Education re: PPA is ongoing. Initiated pt strategies for training her friends and family in assisting her communication. Continue skilled ST to maximize communication as well as train pt in non verbal communication strategies for when PPA progresses.    Speech Therapy Frequency  2x / week    Duration  --   8 weeks or 17 visits   Treatment/Interventions  Compensatory techniques;Cueing hierarchy;Language facilitation;Internal/external aids;Patient/family education;SLP instruction and feedback;Multimodal communcation approach;Functional tasks    Potential to Achieve Goals  Fair    Potential Considerations  Medical prognosis       Patient will benefit from skilled therapeutic intervention in order to improve the following deficits and impairments:   Aphasia    Problem List Patient Active Problem List   Diagnosis Date Noted  . Urge incontinence of urine 10/19/2013  . Pain in limb 08/19/2013  . Varicose veins of lower extremities with other complications [id number]  . DDD (degenerative disc disease), cervical 06/14/2013  . Spinal stenosis of cervical region 06/14/2013  . Depression, major, recurrent, mild ([place]) 05/05/2013  . H/O infectious disease 05/05/2013  . Restless legs syndrome 04/23/2013  . OP (osteoporosis) 04/23/2013  . Asthma, moderate persistent 12/27/2011  . Mixed hyperlipidemia 04/23/2008  . ANEMIA 04/23/2008  . MITRAL VALVE PROLAPSE 04/23/2008  . Seasonal allergic rhinitis 04/23/2008  . PNEUMONIA 04/23/2008    , Annye Rusk MS, CCC-SLP 11/24/2019, 3:04 PM  [facility] [address] [place], [place], [address] Phone: [phone]   Fax:  [phone]   Name: [name] MRN: [id number] Date of Birth: 15-Jan-1935

## 2019-11-24 NOTE — Therapy (Signed)
[facility] [address] [place], [place], [address] Phone: [phone]   Fax:  [phone]  Speech Language Pathology Treatment  Patient Details  Name: [name] MRN: [id number] Date of Birth: 08/10/1935 Referring Provider (SLP): Sherri Sear, [place]   Encounter Date: 11/22/2019  End of Session - 11/24/19 1127    Visit Number  4    Number of Visits  45    SLP Start Time  [id number]    SLP Stop Time   1356    SLP Time Calculation (min)  40 min    Activity Tolerance  Patient tolerated treatment well       Past Medical History:  Diagnosis Date  . Anxiety   . Bronchiectasis without complication [facility])    pulmologist-  dr Soledad Gerlach [facility])-- last chest CT 11/ 2017;  last exacerbation possible pneumonia 11/ 2017  resolved per pt  . Cataract of both eyes    mature  . Chronic cough    NON-PRODUCTIVE  . DDD (degenerative disc disease), cervical   . Depression   . GERD (gastroesophageal reflux disease)    watches diet very carefully and takes zantac/gaviscon prn  . Grade III internal hemorrhoids   . History of Clostridium difficile colitis    2012-- resolved  . History of concussion    1973 approx--  no residual  . History of iron deficiency anemia   . History of recurrent pneumonia    last bout possible bacterial 11/ 2017   . IBS (irritable bowel syndrome)   . Mixed stress and urge urinary incontinence   . Moderate persistent asthma    pulmologist-  dr Soledad Gerlach [facility])    . OA (osteoarthritis)    hands, knees  . OAB (overactive bladder)   . OAB (overactive bladder)   . Osteopenia   . Renal cyst, left    simple-- per urologist note (dr Alona Bene, [facility])  . Seasonal allergic rhinitis   . Wears glasses   . Wears hearing aid    bilateral---  left ear is better than right    Past Surgical History:  Procedure Laterality Date  . BREAST BIOPSY Right yrs ago   benign  . CARDIOVASCULAR STRESS TEST   10/12/2003   normal nuclear study w/ no ischemia/  normal LV function and wall motion, ef 75%  . ENDOVENOUS ABLATION SAPHENOUS VEIN W/ LASER Left 12-08-2013   left greater saphenous vein and sclerotherapy left leg by Curt Jews MD  . EVALUATION UNDER ANESTHESIA WITH HEMORRHOIDECTOMY  05/10/2005  . HEMORRHOID SURGERY N/A 01/01/2017   Procedure: SINGLE COLUMN HEMORRHOIDECTOMY;  Surgeon: Leighton Ruff, MD;  Location: [facility];  Service: General;  Laterality: N/A;  . MASS EXCISION  01/2018   left wrist--path pending  . TRANSTHORACIC ECHOCARDIOGRAM  04-26-2013  [facility])   ef 55-60%/  trivial AR/  mild MR and TR/  RVSF pressure 60mmHg/  no evidence mvp , normal structure and function  . TUBAL LIGATION Bilateral 1992    There were no vitals filed for this visit.  Subjective Assessment - 11/24/19 1125    Subjective  "It's is raining today and I didn't know it was going"    Currently in Pain?  No/denies              SLP Education - 11/24/19 1125    Education Details  compensatins for aphasia; educating family and friends re: PPA       SLP Short  Term Goals - 11/24/19 1126      SLP SHORT TERM GOAL #1   Title  Pt will indicate knowledge of /name the variety of compensatory measures she can incorporate to improve her spoken communication    Time  2    Period  Weeks    Status  On-going      SLP SHORT TERM GOAL #2   Title  pt will demo ability to use compensations to generate functional 5 minutes of mod complex communcation    Time  2    Period  Weeks    Status  On-going      SLP SHORT TERM GOAL #3   Title  pt will complete aphasia testing in 1-2 sessions    Time  2    Period  --   sessions   Status  Achieved       SLP Long Term Goals - 11/24/19 1126      SLP LONG TERM GOAL #1   Title  Pt will use compensatory measures successfully in order to participate functionally in 10 minutes of moderately complex conversation over four sessions    Time  6   or 17  sessions, for all LTGs   Period  Weeks    Status  On-going      SLP LONG TERM GOAL #2   Title  pt will have a plan for successful and functional communication interactions as PPA continues to decline (low-tech, high-tech, etc)    Time  6    Period  Weeks    Status  On-going       Plan - 11/24/19 1126    Clinical Impression Statement  Ongoing training in verbal and non verbal compensations for aphasia with mod A. Education re: PPA is ongoing. Initiated pt strategies for training her friends and family in assisting her communication. Continue skilled ST to maximize communication as well as train pt in non verbal communication strategies for when PPA progresses.       Patient will benefit from skilled therapeutic intervention in order to improve the following deficits and impairments:   Aphasia    Problem List Patient Active Problem List   Diagnosis Date Noted  . Urge incontinence of urine 10/19/2013  . Pain in limb 08/19/2013  . Varicose veins of lower extremities with other complications [id number]  . DDD (degenerative disc disease), cervical 06/14/2013  . Spinal stenosis of cervical region 06/14/2013  . Depression, major, recurrent, mild ([place]) 05/05/2013  . H/O infectious disease 05/05/2013  . Restless legs syndrome 04/23/2013  . OP (osteoporosis) 04/23/2013  . Asthma, moderate persistent 12/27/2011  . Mixed hyperlipidemia 04/23/2008  . ANEMIA 04/23/2008  . MITRAL VALVE PROLAPSE 04/23/2008  . Seasonal allergic rhinitis 04/23/2008  . PNEUMONIA 04/23/2008    Brittiny Levitz, Annye Rusk MS,  CCC-SLP 11/24/2019, 11:27 AM  [facility] [address] [place], [place], [address] Phone: [phone]   Fax:  [phone]   Name: [name] MRN: [id number] Date of Birth: 09-18-1935

## 2019-11-29 ENCOUNTER — Other Ambulatory Visit: Payer: Self-pay

## 2019-11-29 ENCOUNTER — Encounter: Payer: Self-pay | Admitting: Speech Pathology

## 2019-11-29 ENCOUNTER — Ambulatory Visit: Payer: Medicare Other | Admitting: Speech Pathology

## 2019-11-29 DIAGNOSIS — R4701 Aphasia: Secondary | ICD-10-CM | POA: Diagnosis not present

## 2019-11-29 NOTE — Therapy (Signed)
[facility] [address] [place], [place], [address] Phone: [phone]   Fax:  [phone]  Speech Language Pathology Treatment  Patient Details  Name: [name] MRN: [id number] Date of Birth: [birth year] Referring Provider (SLP): Sherri Sear, [place]   Encounter Date: 11/29/2019  End of Session - 11/29/19 1711    Visit Number  6    Number of Visits  17    Date for SLP Re-Evaluation  02/02/20    SLP Start Time  [id number]    SLP Stop Time   1403    SLP Time Calculation (min)  46 min    Activity Tolerance  Patient tolerated treatment well       Past Medical History:  Diagnosis Date  . Anxiety   . Bronchiectasis without complication [facility])    pulmologist-  dr Soledad Gerlach [facility])-- last chest CT 11/ 2017;  last exacerbation possible pneumonia 11/ 2017  resolved per pt  . Cataract of both eyes    mature  . Chronic cough    NON-PRODUCTIVE  . DDD (degenerative disc disease), cervical   . Depression   . GERD (gastroesophageal reflux disease)    watches diet very carefully and takes zantac/gaviscon prn  . Grade III internal hemorrhoids   . History of Clostridium difficile colitis    2012-- resolved  . History of concussion    1973 approx--  no residual  . History of iron deficiency anemia   . History of recurrent pneumonia    last bout possible bacterial 11/ 2017   . IBS (irritable bowel syndrome)   . Mixed stress and urge urinary incontinence   . Moderate persistent asthma    pulmologist-  dr Soledad Gerlach [facility])    . OA (osteoarthritis)    hands, knees  . OAB (overactive bladder)   . OAB (overactive bladder)   . Osteopenia   . Renal cyst, left    simple-- per urologist note (dr Alona Bene, [facility])  . Seasonal allergic rhinitis   . Wears glasses   . Wears hearing aid    bilateral---  left ear is better than right    Past Surgical History:  Procedure Laterality Date  . BREAST BIOPSY Right yrs ago    benign  . CARDIOVASCULAR STRESS TEST  10/12/2003   normal nuclear study w/ no ischemia/  normal LV function and wall motion, ef 75%  . ENDOVENOUS ABLATION SAPHENOUS VEIN W/ LASER Left 12-08-2013   left greater saphenous vein and sclerotherapy left leg by Curt Jews MD  . EVALUATION UNDER ANESTHESIA WITH HEMORRHOIDECTOMY  05/10/2005  . HEMORRHOID SURGERY N/A 01/01/2017   Procedure: SINGLE COLUMN HEMORRHOIDECTOMY;  Surgeon: Leighton Ruff, MD;  Location: [facility];  Service: General;  Laterality: N/A;  . MASS EXCISION  01/2018   left wrist--path pending  . TRANSTHORACIC ECHOCARDIOGRAM  04-26-2013  [facility])   ef 55-60%/  trivial AR/  mild MR and TR/  RVSF pressure 26mmHg/  no evidence mvp , normal structure and function  . TUBAL LIGATION Bilateral 1992    There were no vitals filed for this visit.  Subjective Assessment - 11/29/19 1315    Subjective  "I didn't get my hearing aid in today"    Currently in Pain?  No/denies            ADULT SLP TREATMENT - 11/29/19 1317      General Information   Behavior/Cognition  Alert;Cooperative;Pleasant mood  Cognitive-Linquistic Treatment   Treatment focused on  Aphasia;Patient/family/caregiver education    Skilled Treatment  Reviewed verbal and non verbal strategies for anomia. Trained pt in word finding activities to do at home, using category and naming items in category for each letter of the alphabet. Targeted verbal and gestural compensations for word finding in structured description task with occasional min questioning cues to verbalize most salient properites of the object/word. In conversation, pt utilized compensation for anomia 2x with rare min A      Assessment / Recommendations / Plan   Plan  Continue with current plan of care      Progression Toward Goals   Progression toward goals  Progressing toward goals       SLP Education - 11/29/19 1709    Education Details  verbal and non verbal compensations  for aphasia    Person(s) Educated  Patient    Methods  Explanation;Demonstration;Verbal cues    Comprehension  Verbalized understanding;Returned demonstration;Need further instruction       SLP Short Term Goals - 11/29/19 1710      SLP SHORT TERM GOAL #1   Title  Pt will indicate knowledge of /name the variety of compensatory measures she can incorporate to improve her spoken communication    Time  1    Period  Weeks    Status  On-going      SLP SHORT TERM GOAL #2   Title  pt will demo ability to use compensations to generate functional 5 minutes of mod complex communcation    Time  1    Period  Weeks    Status  On-going      SLP SHORT TERM GOAL #3   Title  pt will complete aphasia testing in 1-2 sessions    Time  2    Period  --   sessions   Status  Achieved       SLP Long Term Goals - 11/29/19 1710      SLP LONG TERM GOAL #1   Title  Pt will use compensatory measures successfully in order to participate functionally in 10 minutes of moderately complex conversation over four sessions    Time  5   or 17 sessions, for all LTGs   Period  Weeks    Status  On-going      SLP LONG TERM GOAL #2   Title  pt will have a plan for successful and functional communication interactions as PPA continues to decline (low-tech, high-tech, etc)    Time  5    Period  Weeks    Status  On-going       Plan - 11/29/19 1709    Clinical Impression Statement  Ongoing training in verbal and non verbal compensations for aphasia with mod A. Education re: PPA is ongoing. Initiated pt strategies for training her friends and family in assisting her communication. Continue skilled ST to maximize communication as well as train pt in non verbal communication strategies for when PPA progresses.Pt states her daughter will attend next session with her.    Speech Therapy Frequency  2x / week    Duration  --   8 weeks or 17 visits   Treatment/Interventions  Compensatory techniques;Cueing  hierarchy;Language facilitation;Internal/external aids;Patient/family education;SLP instruction and feedback;Multimodal communcation approach;Functional tasks    Potential to Achieve Goals  Good    Potential Considerations  Medical prognosis       Patient will benefit from skilled therapeutic intervention in order to improve the  following deficits and impairments:   Aphasia    Problem List Patient Active Problem List   Diagnosis Date Noted  . Urge incontinence of urine 10/19/2013  . Pain in limb 08/19/2013  . Varicose veins of lower extremities with other complications [id number]  . DDD (degenerative disc disease), cervical 06/14/2013  . Spinal stenosis of cervical region 06/14/2013  . Depression, major, recurrent, mild ([place]) 05/05/2013  . H/O infectious disease 05/05/2013  . Restless legs syndrome 04/23/2013  . OP (osteoporosis) 04/23/2013  . Asthma, moderate persistent 12/27/2011  . Mixed hyperlipidemia 04/23/2008  . ANEMIA 04/23/2008  . MITRAL VALVE PROLAPSE 04/23/2008  . Seasonal allergic rhinitis 04/23/2008  . PNEUMONIA 04/23/2008    Aidian Salomon, Annye Rusk MS, CCC-SLP 11/29/2019, 5:12 PM  [facility] [address] [place], [place], [address] Phone: [phone]   Fax:  [phone]   Name: JAIYA ACEVES MRN: [id number] Date of Birth: 09/23/1935

## 2019-12-01 ENCOUNTER — Ambulatory Visit: Payer: Medicare Other | Admitting: Speech Pathology

## 2019-12-06 ENCOUNTER — Other Ambulatory Visit: Payer: Self-pay

## 2019-12-06 ENCOUNTER — Encounter: Payer: Self-pay | Admitting: Speech Pathology

## 2019-12-06 ENCOUNTER — Ambulatory Visit: Payer: Medicare Other | Admitting: Speech Pathology

## 2019-12-06 DIAGNOSIS — R4701 Aphasia: Secondary | ICD-10-CM | POA: Diagnosis not present

## 2019-12-06 NOTE — Therapy (Signed)
[facility] [address] [place], [place], [address] Phone: [phone]   Fax:  [phone]  Speech Language Pathology Treatment  Patient Details  Name: [name] MRN: [id number] Date of Birth: [birth year] Referring Provider (SLP): Sherri Sear, [place]   Encounter Date: 12/06/2019  End of Session - 12/06/19 1447    Visit Number  7    Number of Visits  17    Date for SLP Re-Evaluation  02/02/20    SLP Start Time  19    SLP Stop Time   1356    SLP Time Calculation (min)  42 min    Activity Tolerance  Patient tolerated treatment well       Past Medical History:  Diagnosis Date  . Anxiety   . Bronchiectasis without complication [facility])    pulmologist-  dr Soledad Gerlach [facility])-- last chest CT 11/ 2017;  last exacerbation possible pneumonia 11/ 2017  resolved per pt  . Cataract of both eyes    mature  . Chronic cough    NON-PRODUCTIVE  . DDD (degenerative disc disease), cervical   . Depression   . GERD (gastroesophageal reflux disease)    watches diet very carefully and takes zantac/gaviscon prn  . Grade III internal hemorrhoids   . History of Clostridium difficile colitis    2012-- resolved  . History of concussion    1973 approx--  no residual  . History of iron deficiency anemia   . History of recurrent pneumonia    last bout possible bacterial 11/ 2017   . IBS (irritable bowel syndrome)   . Mixed stress and urge urinary incontinence   . Moderate persistent asthma    pulmologist-  dr Soledad Gerlach [facility])    . OA (osteoarthritis)    hands, knees  . OAB (overactive bladder)   . OAB (overactive bladder)   . Osteopenia   . Renal cyst, left    simple-- per urologist note ([facility] Alona Bene, [facility])  . Seasonal allergic rhinitis   . Wears glasses   . Wears hearing aid    bilateral---  left ear is better than right    Past Surgical History:  Procedure Laterality Date  . BREAST BIOPSY Right yrs ago    benign  . CARDIOVASCULAR STRESS TEST  10/12/2003   normal nuclear study w/ no ischemia/  normal LV function and wall motion, ef 75%  . ENDOVENOUS ABLATION SAPHENOUS VEIN W/ LASER Left 12-08-2013   left greater saphenous vein and sclerotherapy left leg by Curt Jews MD  . EVALUATION UNDER ANESTHESIA WITH HEMORRHOIDECTOMY  05/10/2005  . HEMORRHOID SURGERY N/A 01/01/2017   Procedure: SINGLE COLUMN HEMORRHOIDECTOMY;  Surgeon: Leighton Ruff, MD;  Location: [facility];  Service: General;  Laterality: N/A;  . MASS EXCISION  01/2018   left wrist--path pending  . TRANSTHORACIC ECHOCARDIOGRAM  04-26-2013  [facility])   ef 55-60%/  trivial AR/  mild MR and TR/  RVSF pressure 95mmHg/  no evidence mvp , normal structure and function  . TUBAL LIGATION Bilateral 1992    There were no vitals filed for this visit.  Subjective Assessment - 12/06/19 1416    Subjective  "I'm not as good as I should be"    Currently in Pain?  No/denies            ADULT SLP TREATMENT - 12/06/19 1417      General Information   Behavior/Cognition  Alert;Cooperative;Pleasant mood  Treatment Provided   Treatment provided  Cognitive-Linquistic      Cognitive-Linquistic Treatment   Treatment focused on  Aphasia;Patient/family/caregiver education    Skilled Treatment  [name] enters the treatment room, pulls out an inhaler and tissue. This visit and last she has spit up phlegm, has had excessive and harsh throat clears. Today she had  non productive coughing episode. I coacher her through sniff blow to stop cough. Pt reports waking up in the am coughing with phlegm. She reports difficulty swallowing and only being able to swallow small amounts. [name] also has frequent belching duirng sessions. She scored a 43 on the Reflux Severity Scale (RSI), indicative of reflux. . when asked, [name] does report couging with strong smells, stress and when laying down.  ? if some s/x of VCD.  She has been off of reflux meds for  5-8 years, she could not remember. I phoned her pulmnologst and left message re: [name]'s sx. Complex naming tasks today with usua mod A of semantic cues, phonemic cues. In conversation [name] employed verbal compensations for aphasia successfully with rare min A. Positive feedback stating which compensation she used and why it was successful for on going reinforcement and education. I continue to encourage [name] to brin a family member with her to facilitate carryover and to educate them re: PPA and strategies they can use to help [facility] communicate. [name] indicates her spouse is not interested in attending ST, and she will encourage her daughter to attend some sessions.       Assessment / Recommendations / Plan   Plan  Continue with current plan of care      Progression Toward Goals   Progression toward goals  Progressing toward goals       SLP Education - 12/06/19 1445    Education Details  throat clear alternatives; compensations for aphasia    Person(s) Educated  Patient    Methods  Explanation;Demonstration;Verbal cues;Handout    Comprehension  Verbalized understanding;Returned demonstration;Need further instruction       SLP Short Term Goals - 12/06/19 1447      SLP SHORT TERM GOAL #1   Title  Pt will indicate knowledge of /name the variety of compensatory measures she can incorporate to improve her spoken communication    Time  1    Period  Weeks    Status  On-going      SLP SHORT TERM GOAL #2   Title  pt will demo ability to use compensations to generate functional 5 minutes of mod complex communcation    Time  1    Period  Weeks    Status  On-going      SLP SHORT TERM GOAL #3   Title  pt will complete aphasia testing in 1-2 sessions    Time  2    Period  --   sessions   Status  Achieved       SLP Long Term Goals - 12/06/19 1447      SLP LONG TERM GOAL #1   Title  Pt will use compensatory measures successfully in order to participate functionally in 10 minutes of moderately  complex conversation over four sessions    Time  4   or 17 sessions, for all LTGs   Period  Weeks    Status  On-going      SLP LONG TERM GOAL #2   Title  pt will have a plan for successful and functional communication interactions as PPA continues to decline (  low-tech, high-tech, etc)    Time  4    Period  Weeks    Status  On-going       Plan - 12/06/19 1446    Clinical Impression Statement  Ongoing training in verbal and non verbal compensations for aphasia with mod A. Education re: PPA is ongoing. Initiated pt strategies for training her friends and family in assisting her communication. Continue skilled ST to maximize communication as well as train pt in non verbal communication strategies for when PPA progresses.Pt states her daughter will attend next session with her.    Speech Therapy Frequency  2x / week    Duration  --   8 weeks or 17 visits   Treatment/Interventions  Compensatory techniques;Cueing hierarchy;Language facilitation;Internal/external aids;Patient/family education;SLP instruction and feedback;Multimodal communcation approach;Functional tasks    Potential to Achieve Goals  Good       Patient will benefit from skilled therapeutic intervention in order to improve the following deficits and impairments:   Aphasia    Problem List Patient Active Problem List   Diagnosis Date Noted  . Urge incontinence of urine 10/19/2013  . Pain in limb 08/19/2013  . Varicose veins of lower extremities with other complications [id number]  . DDD (degenerative disc disease), cervical 06/14/2013  . Spinal stenosis of cervical region 06/14/2013  . Depression, major, recurrent, mild ([place]) 05/05/2013  . H/O infectious disease 05/05/2013  . Restless legs syndrome 04/23/2013  . OP (osteoporosis) 04/23/2013  . Asthma, moderate persistent 12/27/2011  . Mixed hyperlipidemia 04/23/2008  . ANEMIA 04/23/2008  . MITRAL VALVE PROLAPSE 04/23/2008  . Seasonal allergic rhinitis 04/23/2008  .  PNEUMONIA 04/23/2008    Poppy Mcafee, Annye Rusk MS, CCC-SLP 12/06/2019, 2:48 PM  [facility] [address] [place], [place], [address] Phone: [phone]   Fax:  [phone]   Name: JENNIFE RAZAVI MRN: [id number] Date of Birth: 02-25-1935

## 2019-12-06 NOTE — Patient Instructions (Addendum)
  Try not to clear your throat, instead take sips of water and swallow hard, or do a forceful blow (huh) and swallow hard.   When you are having a coughing episode, don't try to hold it in. Relax and sniff-blow until the sensation passes  Continue to practice compensations for aphasia in conversations at home and with friends  Provided by: Georgiann Hahn. SLP [phone]

## 2019-12-08 ENCOUNTER — Ambulatory Visit: Payer: Medicare Other | Admitting: Speech Pathology

## 2019-12-08 ENCOUNTER — Encounter: Payer: Self-pay | Admitting: Speech Pathology

## 2019-12-08 ENCOUNTER — Other Ambulatory Visit: Payer: Self-pay

## 2019-12-08 DIAGNOSIS — R41841 Cognitive communication deficit: Secondary | ICD-10-CM

## 2019-12-08 DIAGNOSIS — R4701 Aphasia: Secondary | ICD-10-CM | POA: Diagnosis not present

## 2019-12-08 NOTE — Therapy (Signed)
[facility] [address] [place], [place], [address] Phone: [phone]   Fax:  [phone]  Speech Language Pathology Treatment  Patient Details  Name: [name] MRN: [id number] Date of Birth: 12-21-1935 Referring Provider (SLP): [name], [place]   Encounter Date: 12/08/2019  End of Session - 12/08/19 1429    Visit Number  8    Number of Visits  17    Date for SLP Re-Evaluation  02/02/20    SLP Start Time  [id number]    SLP Stop Time   [id number]    SLP Time Calculation (min)  48 min    Activity Tolerance  Patient tolerated treatment well       Past Medical History:  Diagnosis Date  . Anxiety   . Bronchiectasis without complication [facility])    pulmologist-  dr [name] [facility])-- last chest CT 11/ 2017;  last exacerbation possible pneumonia 11/ 2017  resolved per pt  . Cataract of both eyes    mature  . Chronic cough    NON-PRODUCTIVE  . DDD (degenerative disc disease), cervical   . Depression   . GERD (gastroesophageal reflux disease)    watches diet very carefully and takes zantac/gaviscon prn  . Grade III internal hemorrhoids   . History of Clostridium difficile colitis    2012-- resolved  . History of concussion    1973 approx--  no residual  . History of iron deficiency anemia   . History of recurrent pneumonia    last bout possible bacterial 11/ 2017   . IBS (irritable bowel syndrome)   . Mixed stress and urge urinary incontinence   . Moderate persistent asthma    pulmologist-  dr [name] [facility])    . OA (osteoarthritis)    hands, knees  . OAB (overactive bladder)   . OAB (overactive bladder)   . Osteopenia   . Renal cyst, left    simple-- per urologist note (dr [name], [facility])  . Seasonal allergic rhinitis   . Wears glasses   . Wears hearing aid    bilateral---  left ear is better than right    Past Surgical History:  Procedure Laterality Date  . BREAST BIOPSY Right yrs ago    benign  . CARDIOVASCULAR STRESS TEST  10/12/2003   normal nuclear study w/ no ischemia/  normal LV function and wall motion, ef 75%  . ENDOVENOUS ABLATION SAPHENOUS VEIN W/ LASER Left 12-08-2013   left greater saphenous vein and sclerotherapy left leg by Deborah Jews MD  . EVALUATION UNDER ANESTHESIA WITH HEMORRHOIDECTOMY  05/10/2005  . HEMORRHOID SURGERY N/A 01/01/2017   Procedure: SINGLE COLUMN HEMORRHOIDECTOMY;  Surgeon: Deborah Ruff, MD;  Location: [facility];  Service: General;  Laterality: N/A;  . MASS EXCISION  01/2018   left wrist--path pending  . TRANSTHORACIC ECHOCARDIOGRAM  04-26-2013  [facility])   ef 55-60%/  trivial AR/  mild MR and TR/  RVSF pressure 30mmHg/  no evidence mvp , normal structure and function  . TUBAL LIGATION Bilateral 1992    There were no vitals filed for this visit.  Subjective Assessment - 12/08/19 1415    Subjective  "I'm doing good"    Patient is accompained by:  Family member   daughter, [name]           ADULT SLP TREATMENT - 12/08/19 1416      General Information   Behavior/Cognition  Alert;Cooperative;Pleasant mood  Treatment Provided   Treatment provided  Cognitive-Linquistic      Cognitive-Linquistic Treatment   Treatment focused on  Aphasia;Patient/family/caregiver education    Skilled Treatment  [name]'s daughter, [name] attended session today. I encouraged her to attend as often as she can, as [name] needs support with carryover of compensations and language activities at home. Educated [name] re: aphasia and PPA. [name] states that her mom is using verbal compensations for anomia successfully with her. I educated [name] and [name] in non-verbal compensations of gestures and drawing. Provided examples of how they could practice this at home and rationale for being comfortable using non verbal compensations for progression of PPA. Reviewed language activities that they can do at home. Provided another copy of personally relevant word  list for the home, and instructed [name] to help [name] fill this out, incluidng brands, makeup shades, items [name] is partciular about that her family should know before PPA progresses. In structured sentence generating task using Pharmacist, hospital ([facility]). [name] required questioning cues to utilize compensations for anomia generating 3 agents. She benefitted from fill in the blank written cues as well. She generated objects with rare min A. [name] states she should be able to attend most Wednesdays.       Assessment / Recommendations / Plan   Plan  Continue with current plan of care      Progression Toward Goals   Progression toward goals  Progressing toward goals       SLP Education - 12/08/19 1426    Education Details  compensations for aphasia (verbal and nonverbal); language activities to do at home, questioning cues for daughter to facilitate compensations for aphasia.    Person(s) Educated  Patient;Child(ren)    Methods  Explanation;Demonstration    Comprehension  Verbalized understanding;Returned demonstration;Verbal cues required;Need further instruction       SLP Short Term Goals - 12/08/19 1428      SLP SHORT TERM GOAL #1   Title  Pt will indicate knowledge of /name the variety of compensatory measures she can incorporate to improve her spoken communication    Time  1    Period  Weeks    Status  On-going      SLP SHORT TERM GOAL #2   Title  pt will demo ability to use compensations to generate functional 5 minutes of mod complex communcation    Time  1    Period  Weeks    Status  On-going      SLP SHORT TERM GOAL #3   Title  pt will complete aphasia testing in 1-2 sessions    Time  2    Period  --   sessions   Status  Achieved       SLP Long Term Goals - 12/08/19 1429      SLP LONG TERM GOAL #1   Title  Pt will use compensatory measures successfully in order to participate functionally in 10 minutes of moderately complex conversation over four sessions     Time  4   or 17 sessions, for all LTGs   Period  Weeks    Status  On-going      SLP LONG TERM GOAL #2   Title  pt will have a plan for successful and functional communication interactions as PPA continues to decline (low-tech, high-tech, etc)    Time  4    Period  Weeks    Status  On-going       Plan - 12/08/19  1428    Clinical Impression Statement  Ongoing training in verbal and non verbal compensations for aphasia with mod A. Education re: PPA is ongoing. Initiated pt strategies for training her friends and family in assisting her communication. Continue skilled ST to maximize communication as well as train pt in non verbal communication strategies for when PPA progresses.Pt states her daughter will attend next session with her.    Speech Therapy Frequency  2x / week    Duration  --   8 weeks or 17 visits   Potential to Achieve Goals  Good    Potential Considerations  Medical prognosis       Patient will benefit from skilled therapeutic intervention in order to improve the following deficits and impairments:   Aphasia  Cognitive communication deficit    Problem List Patient Active Problem List   Diagnosis Date Noted  . Urge incontinence of urine 10/19/2013  . Pain in limb 08/19/2013  . Varicose veins of lower extremities with other complications [id number]  . DDD (degenerative disc disease), cervical 06/14/2013  . Spinal stenosis of cervical region 06/14/2013  . Depression, major, recurrent, mild ([place]) 05/05/2013  . H/O infectious disease 05/05/2013  . Restless legs syndrome 04/23/2013  . OP (osteoporosis) 04/23/2013  . Asthma, moderate persistent 12/27/2011  . Mixed hyperlipidemia 04/23/2008  . ANEMIA 04/23/2008  . MITRAL VALVE PROLAPSE 04/23/2008  . Seasonal allergic rhinitis 04/23/2008  . PNEUMONIA 04/23/2008    Lesta Limbert, Annye Rusk MS, CCC-SLP 12/08/2019, 2:30 PM  [facility] [address] [place], [place], [address] Phone: [phone]   Fax:  [phone]   Name: [name] MRN: [id number] Date of Birth: [birth year]

## 2019-12-08 NOTE — Patient Instructions (Signed)
    [place]  Draw and guess the word  Make copies of worksheets for the future  Aphasia affects all of language: talking, writing, listening and reading

## 2019-12-13 ENCOUNTER — Other Ambulatory Visit: Payer: Self-pay

## 2019-12-13 ENCOUNTER — Ambulatory Visit: Payer: Medicare Other | Admitting: Speech Pathology

## 2019-12-13 DIAGNOSIS — R4701 Aphasia: Secondary | ICD-10-CM

## 2019-12-13 DIAGNOSIS — R41841 Cognitive communication deficit: Secondary | ICD-10-CM

## 2019-12-15 ENCOUNTER — Encounter: Payer: Self-pay | Admitting: Speech Pathology

## 2019-12-15 ENCOUNTER — Ambulatory Visit: Payer: Medicare Other | Admitting: Speech Pathology

## 2019-12-15 NOTE — Therapy (Signed)
[facility] [address] [place], [place], [address] Phone: [phone]   Fax:  [phone]  Speech Language Pathology Treatment  Patient Details  Name: [name] MRN: [id number] Date of Birth: [birth year] Referring Provider (SLP): Sherri Sear, [place]   Encounter Date: 12/13/2019  End of Session - 12/15/19 [id number]    Visit Number  9    Number of Visits  17    Date for SLP Re-Evaluation  02/02/20    SLP Start Time  84    SLP Stop Time   1400    SLP Time Calculation (min)  42 min    Activity Tolerance  Patient tolerated treatment well       Past Medical History:  Diagnosis Date  . Anxiety   . Bronchiectasis without complication [place])    pulmologist-  dr Soledad Gerlach [facility])-- last chest CT 11/ 2017;  last exacerbation possible pneumonia 11/ 2017  resolved per pt  . Cataract of both eyes    mature  . Chronic cough    NON-PRODUCTIVE  . DDD (degenerative disc disease), cervical   . Depression   . GERD (gastroesophageal reflux disease)    watches diet very carefully and takes zantac/gaviscon prn  . Grade III internal hemorrhoids   . History of Clostridium difficile colitis    2012-- resolved  . History of concussion    1973 approx--  no residual  . History of iron deficiency anemia   . History of recurrent pneumonia    last bout possible bacterial 11/ 2017   . IBS (irritable bowel syndrome)   . Mixed stress and urge urinary incontinence   . Moderate persistent asthma    pulmologist-  dr Soledad Gerlach [facility])    . OA (osteoarthritis)    hands, knees  . OAB (overactive bladder)   . OAB (overactive bladder)   . Osteopenia   . Renal cyst, left    simple-- per urologist note (dr Alona Bene, [facility]  . Seasonal allergic rhinitis   . Wears glasses   . Wears hearing aid    bilateral---  left ear is better than right    Past Surgical History:  Procedure Laterality Date  . BREAST BIOPSY Right yrs ago    benign  . CARDIOVASCULAR STRESS TEST  10/12/2003   normal nuclear study w/ no ischemia/  normal LV function and wall motion, ef 75%  . ENDOVENOUS ABLATION SAPHENOUS VEIN W/ LASER Left 12-08-2013   left greater saphenous vein and sclerotherapy left leg by Curt Jews MD  . EVALUATION UNDER ANESTHESIA WITH HEMORRHOIDECTOMY  05/10/2005  . HEMORRHOID SURGERY N/A 01/01/2017   Procedure: SINGLE COLUMN HEMORRHOIDECTOMY;  Surgeon: Leighton Ruff, MD;  Location: [facility];  Service: General;  Laterality: N/A;  . MASS EXCISION  01/2018   left wrist--path pending  . TRANSTHORACIC ECHOCARDIOGRAM  04-26-2013  [facility])   ef 55-60%/  trivial AR/  mild MR and TR/  RVSF pressure 61mmHg/  no evidence mvp , normal structure and function  . TUBAL LIGATION Bilateral 1992    There were no vitals filed for this visit.  Subjective Assessment - 12/15/19 0832    Subjective  Pt arrives with binder    Currently in Pain?  No/denies            ADULT SLP TREATMENT - 12/15/19 0832      General Information   Behavior/Cognition  Alert;Cooperative;Pleasant mood      Treatment Provided  Treatment provided  Cognitive-Linquistic      Cognitive-Linquistic Treatment   Treatment focused on  Aphasia;Patient/family/caregiver education    Skilled Treatment  [name] arrives with a binder and dividers. Facilitated conversation and word finding strategies with binder organization task. [name] required usual min to mod questioning cues to utilize compensations. In structured task targeting word finding and sentnece generation (multiple meaning sentencies, [name] utilized describing and gesturing during word finidng episodes with usual questoining and verbal cues. Briefly reviewed LPR precautions at end of session as s/s of VCD persist. Pt is to cal her MD re: starting reflux meds      Assessment / Recommendations / [place] with current plan of care      Progression Toward Goals   Progression toward  goals  Progressing toward goals       SLP Education - 12/15/19 0836    Education Details  compensations for aphasia; reviewed HW work sheets, LPR precautions    Person(s) Educated  Patient    Methods  Explanation;Demonstration;Verbal cues;Handout    Comprehension  Verbalized understanding;Returned demonstration;Verbal cues required;Need further instruction       SLP Short Term Goals - 12/15/19 [id number]      SLP SHORT TERM GOAL #1   Title  Pt will indicate knowledge of /name the variety of compensatory measures she can incorporate to improve her spoken communication    Time  1    Period  Weeks    Status  On-going      SLP SHORT TERM GOAL #2   Title  pt will demo ability to use compensations to generate functional 5 minutes of mod complex communcation    Time  1    Period  Weeks    Status  On-going      SLP SHORT TERM GOAL #3   Title  pt will complete aphasia testing in 1-2 sessions    Time  2    Period  --   sessions   Status  Achieved       SLP Long Term Goals - 12/15/19 [id number]      SLP LONG TERM GOAL #1   Title  Pt will use compensatory measures successfully in order to participate functionally in 10 minutes of moderately complex conversation over four sessions    Time  3   or 17 sessions, for all LTGs   Period  Weeks    Status  On-going      SLP LONG TERM GOAL #2   Title  pt will have a plan for successful and functional communication interactions as PPA continues to decline (low-tech, high-tech, etc)    Time  3    Period  Weeks    Status  On-going       Plan - 12/15/19 0837    Clinical Impression Statement  Ongoing training in verbal and non verbal compensations for aphasia with mod A. Education re: PPA is ongoing. Initiated pt strategies for training her friends and family in assisting her communication. Continue skilled ST to maximize communication as well as train pt in non verbal communication strategies for when PPA progresses.Pt states her daughter will attend  next session with her.    Speech Therapy Frequency  2x / week    Duration  --   8 weeks or 17 visits   Treatment/Interventions  Compensatory techniques;Cueing hierarchy;Language facilitation;Internal/external aids;Patient/family education;SLP instruction and feedback;Multimodal communcation approach;Functional tasks    Potential to Achieve Goals  Good  Potential Considerations  Medical prognosis       Patient will benefit from skilled therapeutic intervention in order to improve the following deficits and impairments:   Aphasia  Cognitive communication deficit    Problem List Patient Active Problem List   Diagnosis Date Noted  . Urge incontinence of urine 10/19/2013  . Pain in limb 08/19/2013  . Varicose veins of lower extremities with other complications [id number]  . DDD (degenerative disc disease), cervical 06/14/2013  . Spinal stenosis of cervical region 06/14/2013  . Depression, major, recurrent, mild ([place]) 05/05/2013  . H/O infectious disease 05/05/2013  . Restless legs syndrome 04/23/2013  . OP (osteoporosis) 04/23/2013  . Asthma, moderate persistent 12/27/2011  . Mixed hyperlipidemia 04/23/2008  . ANEMIA 04/23/2008  . MITRAL VALVE PROLAPSE 04/23/2008  . Seasonal allergic rhinitis 04/23/2008  . PNEUMONIA 04/23/2008    Amarra Sawyer, Annye Rusk MS, CCC-SLP 12/15/2019, 8:39 AM  [facility] [address] [place], [place], [address] Phone: [phone]   Fax:  [phone]   Name: HAZELYNN CREWSE MRN: [id number] Date of Birth: 02-24-1935

## 2019-12-20 ENCOUNTER — Ambulatory Visit: Payer: Medicare Other

## 2019-12-22 ENCOUNTER — Ambulatory Visit: Payer: Medicare Other

## 2019-12-27 ENCOUNTER — Ambulatory Visit: Payer: Medicare PPO | Attending: Internal Medicine | Admitting: Speech Pathology

## 2019-12-27 ENCOUNTER — Other Ambulatory Visit: Payer: Self-pay

## 2019-12-27 ENCOUNTER — Encounter: Payer: Self-pay | Admitting: Speech Pathology

## 2019-12-27 DIAGNOSIS — R498 Other voice and resonance disorders: Secondary | ICD-10-CM | POA: Insufficient documentation

## 2019-12-27 DIAGNOSIS — R4701 Aphasia: Secondary | ICD-10-CM

## 2019-12-27 DIAGNOSIS — R41841 Cognitive communication deficit: Secondary | ICD-10-CM | POA: Diagnosis present

## 2019-12-27 NOTE — Therapy (Signed)
[facility] [address] [place], [place], [address] Phone: [phone]   Fax:  [phone]  Speech Language Pathology Treatment  Patient Details  Name: [name] MRN: [id number] Date of Birth: 03-Sep-1935 Referring Provider (SLP): Sherri Sear, [place]   Encounter Date: 12/27/2019  End of Session - 12/27/19 1513    Visit Number  10    Number of Visits  17    Date for SLP Re-Evaluation  02/18/20    Authorization Type  I extended re-val date as pt has missed 2 weeks of ST as of    SLP Start Time  1314    SLP Stop Time   1401    SLP Time Calculation (min)  47 min    Activity Tolerance  Patient tolerated treatment well       Past Medical History:  Diagnosis Date  . Anxiety   . Bronchiectasis without complication [facility])    pulmologist-  dr Soledad Gerlach [facility])-- last chest CT 11/ 2017;  last exacerbation possible pneumonia 11/ 2017  resolved per pt  . Cataract of both eyes    mature  . Chronic cough    NON-PRODUCTIVE  . DDD (degenerative disc disease), cervical   . Depression   . GERD (gastroesophageal reflux disease)    watches diet very carefully and takes zantac/gaviscon prn  . Grade III internal hemorrhoids   . History of Clostridium difficile colitis    2012-- resolved  . History of concussion    1973 approx--  no residual  . History of iron deficiency anemia   . History of recurrent pneumonia    last bout possible bacterial 11/ 2017   . IBS (irritable bowel syndrome)   . Mixed stress and urge urinary incontinence   . Moderate persistent asthma    pulmologist-  dr Soledad Gerlach [facility])    . OA (osteoarthritis)    hands, knees  . OAB (overactive bladder)   . OAB (overactive bladder)   . Osteopenia   . Renal cyst, left    simple-- per urologist note (dr Alona Bene, [facility])  . Seasonal allergic rhinitis   . Wears glasses   . Wears hearing aid    bilateral---  left ear is better than right     Past Surgical History:  Procedure Laterality Date  . BREAST BIOPSY Right yrs ago   benign  . CARDIOVASCULAR STRESS TEST  10/12/2003   normal nuclear study w/ no ischemia/  normal LV function and wall motion, ef 75%  . ENDOVENOUS ABLATION SAPHENOUS VEIN W/ LASER Left 12-08-2013   left greater saphenous vein and sclerotherapy left leg by Curt Jews MD  . EVALUATION UNDER ANESTHESIA WITH HEMORRHOIDECTOMY  05/10/2005  . HEMORRHOID SURGERY N/A 01/01/2017   Procedure: SINGLE COLUMN HEMORRHOIDECTOMY;  Surgeon: Leighton Ruff, MD;  Location: [facility];  Service: General;  Laterality: N/A;  . MASS EXCISION  01/2018   left wrist--path pending  . TRANSTHORACIC ECHOCARDIOGRAM  04-26-2013  [facility])   ef 55-60%/  trivial AR/  mild MR and TR/  RVSF pressure 46mHg/  no evidence mvp , normal structure and function  . TUBAL LIGATION Bilateral 1992    There were no vitals filed for this visit.  Subjective Assessment - 12/27/19 1504    Subjective  Pt arrives with her daughter. She missed last week as her husband passed awary 12/28.    Patient is accompained by:  --   daughter, [name]  Currently  in Pain?  No/denies         Speech Therapy Progress Note  Dates of Reporting Period: 11/04/19 to 12/27/19  Objective Reports of Subjective Statement: Daughter, [name] reports carryover of compensations for aphasia and self advocating with min cues  Objective Measurements: See goals  Goal Update: continue goals  Plan: continue POC  Reason Skilled Services are Required: Pt and her family require ongoing training of compensatory strategies for pt's current language as well as strategies for as PPA progresses to maximize ease, efficiency of communication and to reduce frustration and caregiver burden     ADULT SLP TREATMENT - 12/27/19 1505      General Information   Behavior/Cognition  Alert;Cooperative;Pleasant mood      Treatment Provided   Treatment provided   Cognitive-Linquistic      Cognitive-Linquistic Treatment   Treatment focused on  Aphasia;Patient/family/caregiver education    Skilled Treatment  [name] continues to report waking at night with mucous/phlegm and difficulty breathing. She clears her throat throughout session. Suggested she see Dr. Chase Caller for VCD. [name]  reported that [name] was using compensatory strategies and self advocating during [employer]. Moderately complex naming tasks pt utilized verbal and gestural compensations for aphasia with occasional min A. She is accurate and successful in use of compensations. [name] required frequent mod to max A to find word - 1st letter and written fill in the blank cues more successful that phonemic or cloze cues. Educated [name] that while we are working on word finidng in [place], it is OK to accept any type of communication attempt that is successful, and it is OK to give her the word when she successfully uses a compensation. [name] verbalized comprehension. We discussed doing language and non verbal communication practice 10-15 minutes twice a day.       Assessment / Recommendations / Plan   Plan  Continue with current plan of care      Progression Toward Goals   Progression toward goals  Progressing toward goals       SLP Education - 12/27/19 1511    Education Details  verbal and non verbal compensations for aphasia;    Person(s) Educated  Patient;Child(ren)    Methods  Explanation;Demonstration;Verbal cues    Comprehension  Verbalized understanding;Returned demonstration;Verbal cues required;Need further instruction       SLP Short Term Goals - 12/27/19 1512      SLP SHORT TERM GOAL #1   Title  Pt will indicate knowledge of /name the variety of compensatory measures she can incorporate to improve her spoken communication    Time  1    Period  Weeks    Status  Achieved      SLP SHORT TERM GOAL #2   Title  pt will demo ability to use compensations to generate functional 5  minutes of mod complex communcation    Time  1    Period  Weeks    Status  Partially Met      SLP SHORT TERM GOAL #3   Title  pt will complete aphasia testing in 1-2 sessions    Time  2    Period  --   sessions   Status  Achieved       SLP Long Term Goals - 12/27/19 1513      SLP LONG TERM GOAL #1   Title  Pt will use compensatory measures successfully in order to participate functionally in 10 minutes of moderately complex conversation over four sessions  Time  2   or 17 sessions, for all LTGs   Period  Weeks    Status  On-going      SLP LONG TERM GOAL #2   Title  pt will have a plan for successful and functional communication interactions as PPA continues to decline (low-tech, high-tech, etc)    Time  2    Period  Weeks    Status  On-going       Plan - 12/27/19 1512    Clinical Impression Statement  Ongoing training in verbal and non verbal compensations for aphasia with mod A. Education re: PPA is ongoing. Initiated pt strategies for training her friends and family in assisting her communication. Continue skilled ST to maximize communication as well as train pt in non verbal communication strategies for when PPA progresses.Pt states her daughter will attend next session with her.    Speech Therapy Frequency  2x / week    Duration  --   8 weeks or 17 visits   Treatment/Interventions  Compensatory techniques;Cueing hierarchy;Language facilitation;Internal/external aids;Patient/family education;SLP instruction and feedback;Multimodal communcation approach;Functional tasks    Potential to Achieve Goals  Good    Potential Considerations  Medical prognosis       Patient will benefit from skilled therapeutic intervention in order to improve the following deficits and impairments:   Aphasia  Cognitive communication deficit    Problem List Patient Active Problem List   Diagnosis Date Noted  . Urge incontinence of urine 10/19/2013  . Pain in limb 08/19/2013  .  Varicose veins of lower extremities with other complications 37/90/2409  . DDD (degenerative disc disease), cervical 06/14/2013  . Spinal stenosis of cervical region 06/14/2013  . Depression, major, recurrent, mild ([place]) 05/05/2013  . H/O infectious disease 05/05/2013  . Restless legs syndrome 04/23/2013  . OP (osteoporosis) 04/23/2013  . Asthma, moderate persistent 12/27/2011  . Mixed hyperlipidemia 04/23/2008  . ANEMIA 04/23/2008  . MITRAL VALVE PROLAPSE 04/23/2008  . Seasonal allergic rhinitis 04/23/2008  . PNEUMONIA 04/23/2008    Dandra Velardi, Annye Rusk MS, CCC-SLP 12/27/2019, 3:18 PM  [facility] [address] [place], [place], [address] Phone: [phone]   Fax:  [phone]   Name: JARELYN BAMBACH MRN: [id number] Date of Birth: [birth year]

## 2019-12-29 ENCOUNTER — Ambulatory Visit: Payer: Medicare PPO | Admitting: Speech Pathology

## 2019-12-29 ENCOUNTER — Encounter: Payer: Self-pay | Admitting: Speech Pathology

## 2019-12-29 ENCOUNTER — Other Ambulatory Visit: Payer: Self-pay

## 2019-12-29 DIAGNOSIS — R4701 Aphasia: Secondary | ICD-10-CM

## 2019-12-29 NOTE — Patient Instructions (Signed)
  Let Dr. Viona Gilmore know she wakes up at night with coughing and not breathing   That she called the ambulance because she couldn't breathe at night  It is worse in the morning  Coughing spells for 3 years or more  Pills get stuck in her throat  Feels post nasal drip  Frequent throat clears

## 2019-12-29 NOTE — Therapy (Signed)
[facility] [address] [place], [place], [address] Phone: [phone]   Fax:  [phone]  Speech Language Pathology Treatment  Patient Details  Name: [name] MRN: [id number] Date of Birth: [birth year] Referring Provider (SLP): Sherri Sear, [place]   Encounter Date: 12/29/2019  End of Session - 12/29/19 1521    Visit Number  11    Number of Visits  17    Date for SLP Re-Evaluation  02/18/20    Authorization Type  I extended re-val date as pt has missed 2 weeks of ST as of    SLP Start Time  1317    SLP Stop Time   1359    SLP Time Calculation (min)  42 min    Activity Tolerance  Patient tolerated treatment well       Past Medical History:  Diagnosis Date  . Anxiety   . Bronchiectasis without complication [facility])    pulmologist-  dr Soledad Gerlach [facility])-- last chest CT 11/ 2017;  last exacerbation possible pneumonia 11/ 2017  resolved per pt  . Cataract of both eyes    mature  . Chronic cough    NON-PRODUCTIVE  . DDD (degenerative disc disease), cervical   . Depression   . GERD (gastroesophageal reflux disease)    watches diet very carefully and takes zantac/gaviscon prn  . Grade III internal hemorrhoids   . History of Clostridium difficile colitis    2012-- resolved  . History of concussion    1973 approx--  no residual  . History of iron deficiency anemia   . History of recurrent pneumonia    last bout possible bacterial 11/ 2017   . IBS (irritable bowel syndrome)   . Mixed stress and urge urinary incontinence   . Moderate persistent asthma    pulmologist-  dr Soledad Gerlach [facility])    . OA (osteoarthritis)    hands, knees  . OAB (overactive bladder)   . OAB (overactive bladder)   . Osteopenia   . Renal cyst, left    simple-- per urologist note (dr Alona Bene, [facility])  . Seasonal allergic rhinitis   . Wears glasses   . Wears hearing aid    bilateral---  left ear is better than right     Past Surgical History:  Procedure Laterality Date  . BREAST BIOPSY Right yrs ago   benign  . CARDIOVASCULAR STRESS TEST  10/12/2003   normal nuclear study w/ no ischemia/  normal LV function and wall motion, ef 75%  . ENDOVENOUS ABLATION SAPHENOUS VEIN W/ LASER Left 12-08-2013   left greater saphenous vein and sclerotherapy left leg by Curt Jews MD  . EVALUATION UNDER ANESTHESIA WITH HEMORRHOIDECTOMY  05/10/2005  . HEMORRHOID SURGERY N/A 01/01/2017   Procedure: SINGLE COLUMN HEMORRHOIDECTOMY;  Surgeon: Leighton Ruff, MD;  Location: [facility];  Service: General;  Laterality: N/A;  . MASS EXCISION  01/2018   left wrist--path pending  . TRANSTHORACIC ECHOCARDIOGRAM  04-26-2013  [facility])   ef 55-60%/  trivial AR/  mild MR and TR/  RVSF pressure 57mHg/  no evidence mvp , normal structure and function  . TUBAL LIGATION Bilateral 1992    There were no vitals filed for this visit.  Subjective Assessment - 12/29/19 1510    Subjective  "I had a lot of phone calls yesterday and did a lot of talking"    Patient is accompained by:  Family member  ADULT SLP TREATMENT - 12/29/19 1510      General Information   Behavior/Cognition  Alert;Cooperative;Pleasant mood      Treatment Provided   Treatment provided  Cognitive-Linquistic      Cognitive-Linquistic Treatment   Treatment focused on  Aphasia;Patient/family/caregiver education    Skilled Treatment  Ongoing training of family (daughter) in appropriate cueing to facilitate word fining with [name]. Daughter demonstrated written cues and semantic cues. She required feedback that phonemic cues are not as beneficial for [name]. We targeted family training in moderately complex naming and written (word level ) task. [name] continues to use verbal and gestural compensations during anomic episodes with rare min A. [name] had a VCD type episode during the session. She required max A to sniff and blow to reduce cough. She has an  appointment with pulm tomorrow      Progression Toward Goals   Progression toward goals  Progressing toward goals       SLP Education - 12/29/19 1515    Education Details  s/s to share with pulmonologist; compensations for aphasia; appropriate cueing techniques for family    Person(s) Educated  Patient;Child(ren)    Methods  Explanation;Demonstration;Verbal cues    Comprehension  Verbalized understanding       SLP Short Term Goals - 12/29/19 1521      SLP SHORT TERM GOAL #1   Title  Pt will indicate knowledge of /name the variety of compensatory measures she can incorporate to improve her spoken communication    Time  1    Period  Weeks    Status  Achieved      SLP SHORT TERM GOAL #2   Title  pt will demo ability to use compensations to generate functional 5 minutes of mod complex communcation    Time  1    Period  Weeks    Status  Partially Met      SLP SHORT TERM GOAL #3   Title  pt will complete aphasia testing in 1-2 sessions    Time  2    Period  --   sessions   Status  Achieved       SLP Long Term Goals - 12/29/19 1521      SLP LONG TERM GOAL #1   Title  Pt will use compensatory measures successfully in order to participate functionally in 10 minutes of moderately complex conversation over four sessions    Time  2   or 17 sessions, for all LTGs   Period  Weeks    Status  On-going      SLP LONG TERM GOAL #2   Title  pt will have a plan for successful and functional communication interactions as PPA continues to decline (low-tech, high-tech, etc)    Time  2    Period  Weeks    Status  On-going       Plan - 12/29/19 1520    Clinical Impression Statement  Ongoing training in verbal and non verbal compensations for aphasia with mod A. Education re: PPA is ongoing. Initiated pt strategies for training her friends and family in assisting her communication. Continue skilled ST to maximize communication as well as train pt in non verbal communication strategies  for when PPA progresses.Pt states her daughter will attend next session with her.    Speech Therapy Frequency  4x / week    Duration  --   8 weeks or 17 visits   Treatment/Interventions  Compensatory techniques;Cueing hierarchy;Language facilitation;Internal/external aids;Patient/family  education;SLP instruction and feedback;Multimodal communcation approach;Functional tasks       Patient will benefit from skilled therapeutic intervention in order to improve the following deficits and impairments:   Aphasia    Problem List Patient Active Problem List   Diagnosis Date Noted  . Urge incontinence of urine 10/19/2013  . Pain in limb 08/19/2013  . 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 DDD (degenerative disc disease), cervical 06/14/2013  . Spinal stenosis of cervical region 06/14/2013  . Depression, major, recurrent, mild ([place]) 05/05/2013  . H/O infectious disease 05/05/2013  . Restless legs syndrome 04/23/2013  . OP (osteoporosis) 04/23/2013  . Asthma, moderate persistent 12/27/2011  . Mixed hyperlipidemia 04/23/2008  . ANEMIA 04/23/2008  . MITRAL VALVE PROLAPSE 04/23/2008  . Seasonal allergic rhinitis 04/23/2008  . PNEUMONIA 04/23/2008    Moo Gravley, Annye Rusk MS, CCC-SLP 12/29/2019, 3:22 PM  [facility] [address] [place], [place], [address] Phone: [phone]   Fax:  [phone]   Name: SWANNIE MILIUS MRN: [id number] Date of Birth: [birth year]

## 2019-12-30 ENCOUNTER — Ambulatory Visit (INDEPENDENT_AMBULATORY_CARE_PROVIDER_SITE_OTHER): Payer: Medicare PPO | Admitting: Internal Medicine

## 2019-12-30 ENCOUNTER — Encounter: Payer: Self-pay | Admitting: Internal Medicine

## 2019-12-30 ENCOUNTER — Ambulatory Visit (INDEPENDENT_AMBULATORY_CARE_PROVIDER_SITE_OTHER): Payer: Medicare PPO

## 2019-12-30 DIAGNOSIS — R05 Cough: Secondary | ICD-10-CM | POA: Diagnosis not present

## 2019-12-30 DIAGNOSIS — J479 Bronchiectasis, uncomplicated: Secondary | ICD-10-CM | POA: Diagnosis not present

## 2019-12-30 DIAGNOSIS — R058 Other specified cough: Secondary | ICD-10-CM

## 2019-12-30 MED ORDER — FLUTTER DEVI: 0 refills | Status: AC

## 2019-12-30 MED ORDER — FAMOTIDINE 20 MG PO TABS: ORAL_TABLET | ORAL | 11 refills | Status: DC

## 2019-12-30 MED ORDER — PANTOPRAZOLE SODIUM 40 MG PO TBEC: 40.0000 mg | DELAYED_RELEASE_TABLET | Freq: Every day | ORAL | 2 refills | Status: AC

## 2019-12-30 NOTE — Progress Notes (Signed)
[name], female    DOB: October 27, 1935, 85 y.o.   MRN: [id number]   Brief patient profile:  51 yowf  pna age 61 Quit smoking 1990 with cough then  that improved and no limits then started chronic cough since around 2000  eval by ENT 2010 dx vcd and Dr Soledad Gerlach at [facility] with dx of bronchiectasis  With first visit 2016 with worse wheezing x 2017     rx vest   History of Present Illness  12/30/2019  Pulmonary/ 1st office eval/[name]  Chief Complaint  Patient presents with  . Pulmonary Consult    Self referral for "vocal cord dysfunction". Pt c/o congestion and mucus in her throat, esp in the am's. She has cough with clear, thick sputum. She uses her albuterol inhaler 2-4 x per day.   Dyspnea:  Walks inside house with difficulty with steps   Cough: lot of am cough white gooey texture  Sleep: 60 degrees  - worse cough supine  SABA use: albuterol up to tid /last used it 7 h prior to OV ? Helped some  Last pred one month prior to OV  ? Helped = not really sure  Having choking spells where husband has to use Heimlich not related to eating    No obvious day to day or daytime variability or assoc   purulent sputum or mucus plugs or hemoptysis or cp or chest tightness, subjective wheeze or overt sinus or hb symptoms.     Also denies any obvious fluctuation of symptoms with weather or environmental changes or other aggravating or alleviating factors except as outlined above   No unusual exposure hx or h/o childhood pna/ asthma or knowledge of premature birth.  Current Allergies, Complete Past Medical History, Past Surgical History, Family History, and Social History were reviewed in [employer] record.  ROS  The following are not active complaints unless bolded Hoarseness, sore throat, dysphagia= globus sensation  dental problems, itching, sneezing,  nasal congestion or discharge of excess mucus or purulent secretions, ear ache,   fever, chills, sweats, unintended wt loss  or wt gain, classically pleuritic or exertional cp,  orthopnea pnd or arm/hand swelling  or leg swelling, presyncope, palpitations, abdominal pain, anorexia, nausea, vomiting, diarrhea  or change in bowel habits or change in bladder habits, change in stools or change in urine, dysuria, hematuria,  rash, arthralgias, visual complaints, headache, numbness, weakness or ataxia or problems with walking or coordination,  change in mood or  memory.                 Past Medical History:  Diagnosis Date  . Anxiety   . Bronchiectasis without complication [facility])    pulmologist-  dr Soledad Gerlach [facility])-- last chest CT 11/ 2017;  last exacerbation possible pneumonia 11/ 2017  resolved per pt  . Cataract of both eyes    mature  . Chronic cough    NON-PRODUCTIVE  . DDD (degenerative disc disease), cervical   . Depression   . GERD (gastroesophageal reflux disease)    watches diet very carefully and takes zantac/gaviscon prn  . Grade III internal hemorrhoids   . History of Clostridium difficile colitis    2012-- resolved  . History of concussion    1973 approx--  no residual  . History of iron deficiency anemia   . History of recurrent pneumonia    last bout possible bacterial 11/ 2017   . IBS (irritable bowel syndrome)   .  Mixed stress and urge urinary incontinence   . Moderate persistent asthma    pulmologist-  dr Soledad Gerlach [facility])    . OA (osteoarthritis)    hands, knees  . OAB (overactive bladder)   . OAB (overactive bladder)   . Osteopenia   . Renal cyst, left    simple-- per urologist note (dr Alona Bene, [facility])  . Seasonal allergic rhinitis   . Wears glasses   . Wears hearing aid    bilateral---  left ear is better than right    Outpatient Medications Prior to Visit  Medication Sig Dispense Refill  . albuterol (PROAIR HFA) 108 (90 Base) MCG/ACT inhaler Inhale into the lungs every 6 (six) hours as needed for wheezing or shortness of breath.    . Alum Hydroxide-Mag Carbonate  (GAVISCON EXTRA STRENGTH PO) Take 1 tablet by mouth as needed.     Marland Kitchen azelastine (ASTELIN) 0.1 % nasal spray Place 2 sprays into both nostrils 2 (two) times daily. Use in each nostril as directed 30 mL 12  . Calcium Carb-Cholecalciferol (CALCIUM 1000 + D PO) Take 1 capsule by mouth 2 (two) times daily.    . cetirizine (ZYRTEC) 10 MG tablet Take 10 mg by mouth as needed for allergies.    . chlorpheniramine (CHLOR-TRIMETON) 4 MG tablet Take 4 mg by mouth every 4 (four) hours as needed for allergies.    . Cholecalciferol (VITAMIN D) 2000 UNITS CAPS Take 1 capsule by mouth every morning.     . Cyanocobalamin (VITAMIN B-12) 2500 MCG SUBL Place under the tongue daily.     Marland Kitchen FLUoxetine (PROZAC) 20 MG capsule Take 1 capsule (20 mg total) by mouth daily. 90 capsule 4  . folic acid (FOLVITE) Q000111Q MCG tablet Take 800 mcg by mouth daily.     . hypromellose (GENTEAL) 0.3 % GEL ophthalmic ointment Place 1 application into both eyes as needed (dry eyes).    Marland Kitchen ibuprofen (ADVIL,MOTRIN) 200 MG tablet Take 200 mg by mouth every 6 (six) hours as needed.    . Magnesium 400 MG CAPS Take 1 capsule by mouth daily.     . mirabegron ER (MYRBETRIQ) 50 MG TB24 tablet Take 1 tablet (50 mg total) by mouth daily. 90 tablet 4  . montelukast (SINGULAIR) 10 MG tablet Take 10 mg by mouth at bedtime.    . ondansetron (ZOFRAN) 4 MG tablet Take 1 tablet (4 mg total) by mouth 2 (two) times daily as needed for nausea or vomiting. 15 tablet 0  . Probiotic Product (PROBIOTIC PO) Take 1 capsule by mouth 2 (two) times daily.     . vitamin E 400 UNIT capsule Take 400 Units by mouth daily.    . fluticasone (FLONASE) 50 MCG/ACT nasal spray Place 2 sprays into the nose 2 (two) times daily.     .           Objective:     BP (!) 152/70 (BP Location: Left Arm, Cuff Size: Normal)   Pulse 74   Temp (!) 97.1 F (36.2 C) (Temporal)   Ht 5' 2.5" (1.588 m)   Wt 124 lb 9.6 oz (56.5 kg)   LMP 12/23/1989   SpO2 98% Comment: on RA  BMI 22.43  kg/m   SpO2: 98 %(on RA)   amb elderly wf mod hoarse / freq throat clearing    HEENT : pt wearing mask not removed for exam due to covid -19 concerns.    NECK :  without JVD/Nodes/TM/ nl carotid  upstrokes bilaterally   LUNGS: no acc muscle use,  Nl contour chest which is clear to A and P bilaterally without cough on insp or exp maneuvers   CV:  RRR  no s3 or murmur or increase in P2, and no edema   ABD:  soft and nontender with nl inspiratory excursion in the supine position. No bruits or organomegaly appreciated, bowel sounds nl  MS:  Nl gait/ ext warm without deformities, calf tenderness, cyanosis or clubbing No obvious joint restrictions   SKIN: warm and dry without lesions    NEURO:  alert, approp, nl sensorium with  no motor or cerebellar deficits apparent.       CXR PA and Lateral:   12/30/2019 :    I personally reviewed images and agree with radiology impression as follows:    No active cardiopulmonary disease.      Assessment   No problem-specific Assessment & Plan notes found for this encounter.     Christinia Gully, MD 12/30/2019

## 2019-12-30 NOTE — Patient Instructions (Addendum)
Increase vest to 4 x daily   For cough >  Robitussin liquid  Per bottle and use the flutter valve as much as you can  Pantoprazole (protonix) 40 mg   Take  30-60 min before first meal of the day and Pepcid (famotidine)  20 mg one after supper  until return to office - this is the best way to tell whether stomach acid is contributing to your problem.   GERD (REFLUX)  is an extremely common cause of respiratory symptoms just like yours , many times with no obvious heartburn at all.    It can be treated with medication, but also with lifestyle changes including elevation of the head of your bed (ideally with 6 -8inch blocks under the headboard of your bed),  Smoking cessation, avoidance of late meals, excessive alcohol, and avoid fatty foods, chocolate, peppermint, colas, red wine, and acidic juices such as orange juice.  NO MINT OR MENTHOL PRODUCTS SO NO COUGH DROPS  USE SUGARLESS CANDY INSTEAD (Jolley ranchers or Stover's or Life Savers) or even ice chips will also do - the key is to swallow to prevent all throat clearing. NO OIL BASED VITAMINS - use powdered substitutes.  Avoid fish oil when coughing.   Please remember to go to the  x-ray department  for your tests - we will call you with the results when they are available     Please schedule a follow up office visit in 4 weeks, sooner if needed

## 2019-12-31 ENCOUNTER — Encounter: Payer: Self-pay | Admitting: Internal Medicine

## 2019-12-31 DIAGNOSIS — R05 Cough: Secondary | ICD-10-CM | POA: Insufficient documentation

## 2019-12-31 DIAGNOSIS — R058 Other specified cough: Secondary | ICD-10-CM | POA: Insufficient documentation

## 2019-12-31 NOTE — Assessment & Plan Note (Addendum)
Original dx of VCD around 2000 ent at [place] with dyphagia under care of ST    Continues to have sensation of globus and urge to cough with nothing sounding like rattling or true mucus congestion on volutary cough. This is typical of Upper airway cough syndrome (previously labeled PNDS),  is so named because it's frequently impossible to sort out how much is  CR/sinusitis with freq throat clearing (which can be related to primary GERD)   vs  causing  secondary (" extra esophageal")  GERD from wide swings in gastric pressure that occur with throat clearing, often  promoting self use of mint and menthol lozenges that reduce the lower esophageal sphincter tone and exacerbate the problem further in a cyclical fashion.   These are the same pts (now being labeled as having "irritable larynx syndrome" by some cough centers) who not infrequently have a history of having failed to tolerate ace inhibitors,  dry powder inhalers or biphosphonates or report having atypical/extraesophageal reflux symptoms that don't respond to standard doses of PPI  and are easily confused as having aecopd or asthma flares by even experienced allergists/ pulmonologists (myself included).   Since already seeing st rec max rx for gerd and consider next challenging with gabapentin as our records don't show she's tried this,   In meantime advised it's healthier to swallow mucus than to grind the airway to remove it and may need to see Dr Joya Gaskins again if not improving with ST rx          Each maintenance medication was reviewed in detail including emphasizing most importantly the difference between maintenance and prns and under what circumstances the prns are to be triggered using an action plan format that is not reflected in the computer generated alphabetically organized AVS which I have not found useful in most complex patients, especially with respiratory illnesses  Total time for H and P, chart review, counseling, teaching  device (flutter valve)  and generating AVS / charting =  60 m

## 2019-12-31 NOTE — Assessment & Plan Note (Addendum)
Clinical dx By Dr Soledad Gerlach at [facility] rx bid as of 12/30/2019 > rec increase to qid and continue flutter valve  Clearly problems mobilizing secretions but I don't think that's what's causing her spells where she completely loses her voice which are much more likely uacs/vcd (see separate a/p)   For now rec  1) max rx with vest 2) instructed on how to [place] fl Flutter valve wit make Robitussin as can't swallow mucinex tablets 2) max rx for gerd which may also help uacs  3) f/u in 4 weeks with all meds in hand using a trust but verify approach to confirm accurate Medication  Reconciliation The principal here is that until we are certain that the  patients are doing what we've asked, it makes no sense to ask them to do more.

## 2020-01-03 ENCOUNTER — Ambulatory Visit: Payer: Medicare PPO | Admitting: Speech Pathology

## 2020-01-03 ENCOUNTER — Other Ambulatory Visit: Payer: Self-pay

## 2020-01-03 DIAGNOSIS — R4701 Aphasia: Secondary | ICD-10-CM | POA: Diagnosis not present

## 2020-01-03 DIAGNOSIS — R498 Other voice and resonance disorders: Secondary | ICD-10-CM

## 2020-01-03 NOTE — Patient Instructions (Signed)
   Practice sniff blow with relaxation 3-4x a day for 5 minutes or so  Use sniff blow as much as you can - when cooking, watching TV, riding in your car  Work to reduce or eliminate the throat clears - this is irritating your larynx and causing more mucus  Do not grind your throat or cough to get up mucus - instead swallow it, then sniff blow with relaxed shoulders and throat  After you talk for a time, rest your voice using sniff/blow for the same amount of time (I know this is not always possible, but do the best you can)  Instead of a throat clear, do some hard swallows or take a sip of water and swallow hard. If you still need to, do a forceful blow and swallow hard

## 2020-01-03 NOTE — Progress Notes (Signed)
ATC, NA 

## 2020-01-05 ENCOUNTER — Ambulatory Visit: Payer: Medicare PPO | Admitting: Speech Pathology

## 2020-01-05 NOTE — Therapy (Signed)
[facility] [address] [place], [place], [address] Phone: [phone]   Fax:  [phone]  Speech Language Pathology Treatment  Patient Details  Name: [name] MRN: [id number] Date of Birth: June 14, 1935 Referring Provider (SLP): Sherri Sear, [place]   Encounter Date: 01/03/2020  End of Session - 01/05/20 0846    Visit Number  12    Number of Visits  17    Date for SLP Re-Evaluation  02/18/20    Authorization Type  I extended re-val date as pt has missed 2 weeks of ST as of    SLP Start Time  1315    SLP Stop Time   1358    SLP Time Calculation (min)  43 min    Activity Tolerance  Patient tolerated treatment well       Past Medical History:  Diagnosis Date  . Anxiety   . Bronchiectasis without complication [facility])    pulmologist-  dr Soledad Gerlach [facility])-- last chest CT 11/ 2017;  last exacerbation possible pneumonia 11/ 2017  resolved per pt  . Cataract of both eyes    mature  . Chronic cough    NON-PRODUCTIVE  . DDD (degenerative disc disease), cervical   . Depression   . GERD (gastroesophageal reflux disease)    watches diet very carefully and takes zantac/gaviscon prn  . Grade III internal hemorrhoids   . History of Clostridium difficile colitis    2012-- resolved  . History of concussion    1973 approx--  no residual  . History of iron deficiency anemia   . History of recurrent pneumonia    last bout possible bacterial 11/ 2017   . IBS (irritable bowel syndrome)   . Mixed stress and urge urinary incontinence   . Moderate persistent asthma    pulmologist-  dr Soledad Gerlach [facility])    . OA (osteoarthritis)    hands, knees  . OAB (overactive bladder)   . OAB (overactive bladder)   . Osteopenia   . Renal cyst, left    simple-- per urologist note (dr Alona Bene, [facility]  . Seasonal allergic rhinitis   . Wears glasses   . Wears hearing aid    bilateral---  left ear is better than right     Past Surgical History:  Procedure Laterality Date  . BREAST BIOPSY Right yrs ago   benign  . CARDIOVASCULAR STRESS TEST  10/12/2003   normal nuclear study w/ no ischemia/  normal LV function and wall motion, ef 75%  . ENDOVENOUS ABLATION SAPHENOUS VEIN W/ LASER Left 12-08-2013   left greater saphenous vein and sclerotherapy left leg by Curt Jews MD  . EVALUATION UNDER ANESTHESIA WITH HEMORRHOIDECTOMY  05/10/2005  . HEMORRHOID SURGERY N/A 01/01/2017   Procedure: SINGLE COLUMN HEMORRHOIDECTOMY;  Surgeon: Leighton Ruff, MD;  Location: [facility];  Service: General;  Laterality: N/A;  . MASS EXCISION  01/2018   left wrist--path pending  . TRANSTHORACIC ECHOCARDIOGRAM  04-26-2013  [facility])   ef 55-60%/  trivial AR/  mild MR and TR/  RVSF pressure 58mHg/  no evidence mvp , normal structure and function  . TUBAL LIGATION Bilateral 1992    There were no vitals filed for this visit.  Subjective Assessment - 01/05/20 0832    Subjective  "He said he can help me" re: Pulmonologist consult for VCD    Patient is accompained by:  Family member   daughter RJoseph Art  Currently in Pain?  No/denies            ADULT SLP TREATMENT - 01/05/20 0833      General Information   Behavior/Cognition  Alert;Cooperative;Pleasant mood      Treatment Provided   Treatment provided  Cognitive-Linquistic      Cognitive-Linquistic Treatment   Treatment focused on  Aphasia;Patient/family/caregiver education;Voice    Skilled Treatment  [name] had pulmonolgy consult for VCD yesterday and will be followed by them. I re- educated her re: Dr. Gustavus Bryant recommendations to wear her vest 4x a day and to swallow mucus, rather than use grinding cough to bring it up. Re-educated her on practicing sniff-blow and relaxation of her throat and shoulders throughtout the day. Also reiterated to [place] the need to eliminate throat cleart. Trained them in alternatives to throat clears. Due to PPA, [name] required  multiptle repetitions and written cues to comprehend pulmonolgy's recommendations re: wearing her vest and swallowing her mucus. Ongoing education of [name] and [name] of auditory comprehension impairments and compensations for auditory comprehension.       Assessment / Recommendations / Plan   Plan  Goals updated   added goals for VCD     Progression Toward Goals   Progression toward goals  Progressing toward goals       SLP Education - 01/05/20 0838    Education Details  compensations for auditory comprehension; compensations for VCD    Person(s) Educated  Patient;Child(ren)    Methods  Explanation;Demonstration    Comprehension  Verbalized understanding       SLP Short Term Goals - 01/05/20 0842      SLP SHORT TERM GOAL #1   Title  Pt will indicate knowledge of /name the variety of compensatory measures she can incorporate to improve her spoken communication    Time  1    Period  Weeks    Status  Achieved      SLP SHORT TERM GOAL #2   Title  pt will demo ability to use compensations to generate functional 5 minutes of mod complex communcation    Time  1    Period  Weeks    Status  Partially Met      SLP SHORT TERM GOAL #3   Title  pt will complete aphasia testing in 1-2 sessions    Time  2    Period  --   sessions   Status  Achieved       SLP Long Term Goals - 01/05/20 4580      SLP LONG TERM GOAL #1   Title  Pt will use compensatory measures successfully in order to participate functionally in 10 minutes of moderately complex conversation over four sessions    Time  1   or 17 sessions, for all LTGs   Period  Weeks    Status  Achieved      SLP LONG TERM GOAL #2   Title  pt will have a plan for successful and functional communication interactions as PPA continues to decline (low-tech, high-tech, etc)    Time  2    Period  Weeks    Status  On-going      SLP LONG TERM GOAL #3   Title  Pt will utilize sniff-blow and relaxtion during VCD episodes at home per family  report over 3 sessions    Time  2    Period  Weeks    Status  New      SLP LONG TERM GOAL #4  Title  Pt will report 50% reduction in VCD episodes over 3 sessoins    Time  2    Period  Weeks    Status  New      SLP LONG TERM GOAL #5   Title  Pt will utilize alternative to clearing her throat with occasional min A over 2 sessions    Time  2    Period  Weeks    Status  New       Plan - 01/05/20 4315    Clinical Impression Statement  Ongoing training in verbal and non verbal compensations for aphasia with mod A. Education re: PPA is ongoing. Initiated pt strategies for training her friends and family in assisting her communication. Continue skilled ST to maximize communication as well as train pt in non verbal communication strategies for when PPA progresses.Pt states her daughter will attend next session with her.As [name] is being treated medically for VCD, I will continue to add education and training for behavioral tx of VCD, as this affects her throughout the day and night., Due to PPA, she requires repeition, demonstration and written cues to comprehend and recall VCD strategies.VCD goals added.       Patient will benefit from skilled therapeutic intervention in order to improve the following deficits and impairments:   Aphasia  Other voice and resonance disorders    Problem List Patient Active Problem List   Diagnosis Date Noted  . Upper airway cough syndrome/ VCD 12/31/2019  . Bronchiectasis without complication ([place]) 40/07/6760  . Urge incontinence of urine 10/19/2013  . Pain in limb 08/19/2013  . Varicose veins of lower extremities with other complications 95/08/3266  . DDD (degenerative disc disease), cervical 06/14/2013  . Spinal stenosis of cervical region 06/14/2013  . Depression, major, recurrent, mild ([place]) 05/05/2013  . H/O infectious disease 05/05/2013  . Restless legs syndrome 04/23/2013  . OP (osteoporosis) 04/23/2013  . Asthma, moderate persistent  12/27/2011  . Mixed hyperlipidemia 04/23/2008  . ANEMIA 04/23/2008  . MITRAL VALVE PROLAPSE 04/23/2008  . Seasonal allergic rhinitis 04/23/2008  . PNEUMONIA 04/23/2008    Nakia Koble, Annye Rusk MS, CCC-SLP 01/05/2020, 8:47 AM  [facility] [address] [place], [place], [address] Phone: [phone]   Fax:  [phone]   Name: ABIHA LUKEHART MRN: [id number] Date of Birth: 1935-08-14

## 2020-01-10 ENCOUNTER — Ambulatory Visit: Payer: Medicare PPO | Admitting: Speech Pathology

## 2020-01-10 ENCOUNTER — Other Ambulatory Visit: Payer: Self-pay

## 2020-01-10 DIAGNOSIS — R4701 Aphasia: Secondary | ICD-10-CM

## 2020-01-10 NOTE — Patient Instructions (Signed)
[name] and I decided she would benefit from taking a break in ST, to help her focus on processing your dad's passing and all that has happened since. Please call me if you have concerns about this. I feel [name] is communicating effectively and efficiently at this time. She reports she is talking a lot to family and friends on the phone - this is great practice!   Try using a bookmark or index card to help you read the paper - see if it helps you read the paper  Also be aware that her auditory comprehension (understanding spoken words)is affected by aphasia, so she doesn't comprehend everything a doctor or pharmacist or insurance person tells her. This can look like a memory issue or confusion, because she is not doing what they tell her or she gets instructions mixed up. It is best to give her information from these sources in writing. Simple, short written instructions or information.  [phone]  If you or [name] notice a decline in her communication, her independence at home or have safety concerns re: her decision making, come on back to speech therapy  When you return to ST in a few months or if you have a decline, get an order for speech therapy from Dr. Tommi Rumps  Keep reducing your throat clears   When you catch yourself clearing your throat, 5 sniff blows afterward  When you return, note that you will need someone to come with you for most of your visits to help you carryover what we do in ST and help you practice at home

## 2020-01-23 ENCOUNTER — Encounter: Payer: Self-pay | Admitting: Speech Pathology

## 2020-01-23 NOTE — Therapy (Signed)
[facility] [address] [place], [place], [address] Phone: [phone]   Fax:  [phone]  Speech Language Pathology Treatment & Discharge Summary  Patient Details  Name: [name] MRN: [id number] Date of Birth: 27-May-1935 Referring Provider (SLP): [name], [place]   Encounter Date: 01/10/2020  End of Session - 01/23/20 2100    Visit Number  13    Number of Visits  17    Date for SLP Re-Evaluation  02/18/20    SLP Start Time  2993    SLP Stop Time   1400    SLP Time Calculation (min)  47 min    Activity Tolerance  Patient tolerated treatment well       Past Medical History:  Diagnosis Date  . Anxiety   . Bronchiectasis without complication [facility])    pulmologist-  dr [name] [facility])-- last chest CT 11/ 2017;  last exacerbation possible pneumonia 11/ 2017  resolved per pt  . Cataract of both eyes    mature  . Chronic cough    NON-PRODUCTIVE  . DDD (degenerative disc disease), cervical   . Depression   . GERD (gastroesophageal reflux disease)    watches diet very carefully and takes zantac/gaviscon prn  . Grade III internal hemorrhoids   . History of Clostridium difficile colitis    2012-- resolved  . History of concussion    1973 approx--  no residual  . History of iron deficiency anemia   . History of recurrent pneumonia    last bout possible bacterial 11/ 2017   . IBS (irritable bowel syndrome)   . Mixed stress and urge urinary incontinence   . Moderate persistent asthma    pulmologist-  dr [name] [facility])    . OA (osteoarthritis)    hands, knees  . OAB (overactive bladder)   . OAB (overactive bladder)   . Osteopenia   . Renal cyst, left    simple-- per urologist note (dr [name], [facility])  . Seasonal allergic rhinitis   . Wears glasses   . Wears hearing aid    bilateral---  left ear is better than right    Past Surgical History:  Procedure Laterality Date  . BREAST  BIOPSY Right yrs ago   benign  . CARDIOVASCULAR STRESS TEST  10/12/2003   normal nuclear study w/ no ischemia/  normal LV function and wall motion, ef 75%  . ENDOVENOUS ABLATION SAPHENOUS VEIN W/ LASER Left 12-08-2013   left greater saphenous vein and sclerotherapy left leg by Deborah Jews MD  . EVALUATION UNDER ANESTHESIA WITH HEMORRHOIDECTOMY  05/10/2005  . HEMORRHOID SURGERY N/A 01/01/2017   Procedure: SINGLE COLUMN HEMORRHOIDECTOMY;  Surgeon: Deborah Ruff, MD;  Location: [facility];  Service: General;  Laterality: N/A;  . MASS EXCISION  01/2018   left wrist--path pending  . TRANSTHORACIC ECHOCARDIOGRAM  04-26-2013  [facility])   ef 55-60%/  trivial AR/  mild MR and TR/  RVSF pressure 47mHg/  no evidence mvp , normal structure and function  . TUBAL LIGATION Bilateral 1992    There were no vitals filed for this visit.  Subjective Assessment - 01/23/20 2046    Subjective  "I can't go in his room"    Currently in Pain?  No/denies            ADULT SLP TREATMENT - 01/23/20 2046      Treatment Provided   Treatment provided  Cognitive-Linquistic  Cognitive-Linquistic Treatment   Treatment focused on  Aphasia;Patient/family/caregiver education;Voice    Skilled Treatment  [name] is tearful re: recent loss of spouse. Difficulty clearining out his room. [name] employs and verbalizes multiple compensatins for PPA. [name] participates in 20 minutes of complex conversation with mod I using compensations for aphasia.       Assessment / Recommendations / Plan   Plan  Discharge SLP treatment due to (comment)      Progression Toward Goals   Progression toward goals  Goals met, education completed, patient discharged from SLP       SLP Education - 01/23/20 2052    Education Details  compensations for aphasia; family cueing for compensations; language acitivities for [employer]) Educated  Patient    Methods  Explanation;Demonstration;Verbal cues;Handout    Comprehension   Verbalized understanding;Returned demonstration;Verbal cues required      SPEECH THERAPY DISCHARGE SUMMARY  Visits from Start of Care: 13  Current functional level related to goals / functional outcomes: See goals below   Remaining deficits: PPA; VCD   Education / Equipment: Compensations for aphasia; compensations for VCD Plan: Patient agrees to discharge.  Patient goals were partially met. Patient is being discharged due to being pleased with the current functional level.  ?????          SLP Short Term Goals - 01/23/20 2059      SLP SHORT TERM GOAL #1   Title  Pt will indicate knowledge of /name the variety of compensatory measures she can incorporate to improve her spoken communication    Time  1    Period  Weeks    Status  Achieved      SLP SHORT TERM GOAL #2   Title  pt will demo ability to use compensations to generate functional 5 minutes of mod complex communcation    Time  1    Period  Weeks    Status  Partially Met      SLP SHORT TERM GOAL #3   Title  pt will complete aphasia testing in 1-2 sessions    Time  2    Period  --   sessions   Status  Achieved       SLP Long Term Goals - 01/23/20 2059      SLP LONG TERM GOAL #1   Title  Pt will use compensatory measures successfully in order to participate functionally in 10 minutes of moderately complex conversation over four sessions    Time  1   or 17 sessions, for all LTGs   Period  Weeks    Status  Achieved      SLP LONG TERM GOAL #2   Title  pt will have a plan for successful and functional communication interactions as PPA continues to decline (low-tech, high-tech, etc)    Time  2    Period  Weeks    Status  Achieved      SLP LONG TERM GOAL #3   Title  Pt will utilize sniff-blow and relaxtion during VCD episodes at home per family report over 3 sessions    Time  2    Period  Weeks    Status  Partially Met      SLP LONG TERM GOAL #4   Title  Pt will report 50% reduction in VCD episodes  over 3 sessoins    Time  2    Period  Weeks    Status  Partially Met  SLP LONG TERM GOAL #5   Title  Pt will utilize alternative to clearing her throat with occasional min A over 2 sessions    Time  2    Period  Weeks    Status  Partially Met       Plan - 01/23/20 2053    Clinical Impression Statement  [name] has met language goals. She utilizes compensations for PPA with rare min A to mod IAbner Rich and family aware of strategies to mixmize efficiency and effectiveness of verba communication [name] has expressed difficulty following up with home program due to recent lose of spouse, grief and dealing with practicalities re: spouse's death. We agree this is time to d/c ST and resume in 6+months or if significant decline.    Speech Therapy Frequency  2x / week    Duration  --   8 weeks or 17 visits   Treatment/Interventions  Compensatory techniques;Cueing hierarchy;Language facilitation;Internal/external aids;Patient/family education;SLP instruction and feedback;Multimodal communcation approach;Functional tasks    Potential Considerations  Medical prognosis    Consulted and Agree with Plan of Care  Patient       Patient will benefit from skilled therapeutic intervention in order to improve the following deficits and impairments:   Aphasia    Problem List Patient Active Problem List   Diagnosis Date Noted  . Upper airway cough syndrome/ VCD 12/31/2019  . Bronchiectasis without complication ([place]) 28/97/9150  . Urge incontinence of urine 10/19/2013  . Pain in limb 08/19/2013  . Varicose veins of lower extremities with other complications 41/36/4383  . DDD (degenerative disc disease), cervical 06/14/2013  . Spinal stenosis of cervical region 06/14/2013  . Depression, major, recurrent, mild ([place]) 05/05/2013  . H/O infectious disease 05/05/2013  . Restless legs syndrome 04/23/2013  . OP (osteoporosis) 04/23/2013  . Asthma, moderate persistent 12/27/2011  . Mixed hyperlipidemia  04/23/2008  . ANEMIA 04/23/2008  . MITRAL VALVE PROLAPSE 04/23/2008  . Seasonal allergic rhinitis 04/23/2008  . PNEUMONIA 04/23/2008    Raelyn Racette, Annye Rusk MS, CCC-SLP 01/23/2020, 9:00 PM  [facility] [address] [place], [place], [address] Phone: [phone]   Fax:  [phone]   Name: [name] MRN: [id number] Date of Birth: 08-14-1935

## 2020-01-28 ENCOUNTER — Encounter: Payer: Self-pay | Admitting: Pulmonary Disease

## 2020-01-28 ENCOUNTER — Ambulatory Visit (INDEPENDENT_AMBULATORY_CARE_PROVIDER_SITE_OTHER): Payer: Medicare PPO | Admitting: Pulmonary Disease

## 2020-01-28 ENCOUNTER — Ambulatory Visit: Payer: Medicare PPO | Admitting: Internal Medicine

## 2020-01-28 ENCOUNTER — Other Ambulatory Visit: Payer: Self-pay

## 2020-01-28 DIAGNOSIS — J479 Bronchiectasis, uncomplicated: Secondary | ICD-10-CM

## 2020-01-28 DIAGNOSIS — J302 Other seasonal allergic rhinitis: Secondary | ICD-10-CM

## 2020-01-28 NOTE — Assessment & Plan Note (Signed)
Patient reporting she is doing well Slight increase of cough Increased nasal drainage Patient reports that she is stop using her therapy vest  Plan: Resume therapy vest twice daily Increase nasal saline rinses to twice daily Continue flutter valve use Continue all other respiratory medications 1 week follow-up to check in on patient

## 2020-01-28 NOTE — Patient Instructions (Addendum)
You were seen today by Lauraine Rinne, NP  for:   1. Bronchiectasis without complication (HCC)  Resume therapy vest use twice daily  Bronchiectasis: This is the medical term which indicates that you have damage, dilated airways making you more susceptible to respiratory infection. Use a flutter valve 10 breaths twice a day or 4 to 5 breaths 4-5 times a day to help clear mucus out Let us know if you have cough with change in mucus color or fevers or chills.  At that point you would need an antibiotic. Maintain a healthy nutritious diet, eating whole foods Take your medications as prescribed    2. Seasonal allergic rhinitis, unspecified trigger  Increase nasal saline rinses to twice daily  Continue Singulair Continue Zyrtec  continue chlorpheniramine (aka Chlor tabs) 4 mg tablet (1 to 2 tablets at night) for management of allergies and postnasal drip at night >>> This is an over-the-counter medication >>> This medication is sedating   Follow Up:    Return in about 1 week (around 02/04/2020), or if symptoms worsen or fail to improve, for Follow up with Wyn Quaker FNP-C, Follow up with Dr. Melvyn Novas.   Please do your part to reduce the spread of COVID-19:      Reduce your risk of any infection  and COVID19 by using the similar precautions used for avoiding the common cold or flu:  Marland Kitchen Wash your hands often with soap and warm water for at least 20 seconds.  If soap and water are not readily available, use an alcohol-based hand sanitizer with at least 60% alcohol.  . If coughing or sneezing, cover your mouth and nose by coughing or sneezing into the elbow areas of your shirt or coat, into a tissue or into your sleeve (not your hands). Langley Gauss A MASK when in public  . Avoid shaking hands with others and consider head nods or verbal greetings only. . Avoid touching your eyes, nose, or mouth with unwashed hands.  . Avoid close contact with people who are sick. . Avoid places or events with  large numbers of people in one location, like concerts or sporting events. . If you have some symptoms but not all symptoms, continue to monitor at home and seek medical attention if your symptoms worsen. . If you are having a medical emergency, call 911.   [place] / e-Visit: [email or web address]         [facility]: [facility]: [id number]                   [facility]: [id number]     It is flu season:   >>> Best ways to protect herself from the flu: Receive the yearly flu vaccine, practice good hand hygiene washing with soap and also using hand sanitizer when available, eat a nutritious meals, get adequate rest, hydrate appropriately   Please contact the office if your symptoms worsen or you have concerns that you are not improving.   Thank you for choosing [facility] for your healthcare, and for allowing [place] to partner with you on your healthcare journey. I am thankful to be able to provide care to you today.   Wyn Quaker FNP-C

## 2020-01-28 NOTE — Progress Notes (Signed)
Virtual Visit via Telephone Note  I connected with [name] on 01/28/20 at  2:30 PM EST by telephone and verified that I am speaking with the correct person using two identifiers.  Location: Patient: Home Provider: [facility] - [id number] [place], [place], [place], [place] [address]   I discussed the limitations, risks, security and privacy concerns of performing an evaluation and management service by telephone and the availability of in person appointments. I also discussed with the patient that there may be a patient responsible charge related to this service. The patient expressed understanding and agreed to proceed.  Patient consented to consult via telephone: Yes People present and their role in pt care: Pt   History of Present Illness:  85 year old female former smoker followed in our office for bronchiectasis and asthma  started chronic cough since around 2000  eval by ENT 2010 dx vcd and Dr Soledad Gerlach at [facility] with dx of bronchiectasis   Past medical history: Hyperlipidemia, anemia, restless leg syndrome, depression Smoking history: Former smoker.  Quit 1990. Maintenance: Patient of Dr. Melvyn Novas  Chief complaint: 4 week follow up   85 year old female former smoker quit 1990.  Followed in our office for asthma, bronchiectasis and vocal cord dysfunction.  Patient established with our office in January/2021.  She had previously been evaluated for chronic cough by her ENT in 2000.  She was diagnosed with vocal cord dysfunction in 2010.  And she was diagnosed with bronchiectasis by Dr. Soledad Gerlach at [facility].  At last office visit in January/2021 patient was instructed to increase therapy vest use to 4 times a day.  Patient reports that she has been doing so well she has not even had to use her vest.  She has been using her flutter valve.  She does have some clear nasal drainage despite all of her medications.  She is using nasal saline rinses 1 time daily.  She  feels that because of the clear nasal drainage she has been coughing more.  She is unsure what the color of her respiratory sputum is as she swallows it.  She has been adherent to her flutter valve she reports.  Observations/Objective:  Social History   Tobacco Use  Smoking Status Former Smoker  . Packs/day: 0.25  . Years: 30.00  . Pack years: 7.50  . Types: Cigarettes  . Quit date: 12/27/1988  . Years since quitting: 31.1  Smokeless Tobacco Never Used   Immunization History  Administered Date(s) Administered  . Influenza Split 09/22/2018  . Influenza, High Dose Seasonal PF 10/16/2012, 10/29/2013, 09/29/2015, 10/31/2016, 10/03/2017, 09/25/2018  . Influenza-Unspecified 09/29/2009, 10/16/2012, 10/29/2013, 09/29/2015, 10/31/2016, 10/03/2017, 09/22/2018  . Pneumococcal Conjugate-13 01/22/2013  . Pneumococcal Polysaccharide-23 04/23/2013  . Pneumococcal-Unspecified 12/23/2008, 01/22/2013  . Tdap 12/24/2007, 12/23/2008  . Zoster 12/23/2010    Assessment and Plan:  Bronchiectasis without complication [facility]) Patient reporting she is doing well Slight increase of cough Increased nasal drainage Patient reports that she is stop using her therapy vest  Plan: Resume therapy vest twice daily Increase nasal saline rinses to twice daily Continue flutter valve use Continue all other respiratory medications 1 week follow-up to check in on patient  Seasonal allergic rhinitis Plan: Increase nasal saline rinses to twice daily Continue all other respiratory/nasal medications  Follow Up Instructions:  Return in about 1 week (around 02/04/2020), or if symptoms worsen or fail to improve, for Follow up with Wyn Quaker FNP-C, Follow up with Dr. Melvyn Novas.   I discussed  the assessment and treatment plan with the patient. The patient was provided an opportunity to ask questions and all were answered. The patient agreed with the plan and demonstrated an understanding of the instructions.   The patient  was advised to call back or seek an in-person evaluation if the symptoms worsen or if the condition fails to improve as anticipated.  I provided 23 minutes of non-face-to-face time during this encounter.   Lauraine Rinne, NP

## 2020-01-28 NOTE — Assessment & Plan Note (Signed)
Plan: Increase nasal saline rinses to twice daily Continue all other respiratory/nasal medications

## 2020-02-03 NOTE — Progress Notes (Signed)
Virtual Visit via Telephone Note  I connected with Chyrel Masson on 02/04/20 at 11:30 AM EST by telephone and verified that I am speaking with the correct person using two identifiers.  Location: Patient: Home Provider: Office [facility] - [id number] [place], [place], [place], [place] [address]   I discussed the limitations, risks, security and privacy concerns of performing an evaluation and management service by telephone and the availability of in person appointments. I also discussed with the patient that there may be a patient responsible charge related to this service. The patient expressed understanding and agreed to proceed.  Patient consented to consult via telephone: Yes People present and their role in pt care: Pt   History of Present Illness:  85 year old female former smoker followed in our office for bronchiectasis and asthma started chronic cough since around 2000  eval by ENT 2010 dx vcd and Dr Soledad Gerlach at [facility] with dx of bronchiectasis   Past medical history: Hyperlipidemia, anemia, restless leg syndrome, depression Smoking history: Former smoker.  Quit 1990. Maintenance: None Patient of Dr. Melvyn Novas  Chief complaint: 1 week follow up   86 year old female followed in our office with bronchiectasis.  She is completing a 1 week follow-up with our office today.  This is a televisit.  Patient reports that she is doing very well.  She does not have any current respiratory complaints.  She feels that her congestion is well managed.  She has some congestion and nasal congestion in the mornings but she believes this is her baseline.  She believes she is well managed with Singulair, Zyrtec, nasal saline rinses.  She does not feel that her therapy vest helps her very much.  She does use her flutter valve occasionally.  Observations/Objective:  Social History   Tobacco Use  Smoking Status Former Smoker  . Packs/day: 0.25  . Years: 30.00  . Pack years: 7.50  . Types:  Cigarettes  . Quit date: 12/27/1988  . Years since quitting: 31.1  Smokeless Tobacco Never Used   Immunization History  Administered Date(s) Administered  . Influenza Split 09/22/2018  . Influenza, High Dose Seasonal PF 10/16/2012, 10/29/2013, 09/29/2015, 10/31/2016, 10/03/2017, 09/25/2018  . Influenza-Unspecified 09/29/2009, 10/16/2012, 10/29/2013, 09/29/2015, 10/31/2016, 10/03/2017, 09/22/2018  . Pneumococcal Conjugate-13 01/22/2013  . Pneumococcal Polysaccharide-23 04/23/2013  . Pneumococcal-Unspecified 12/23/2008, 01/22/2013  . Tdap 12/24/2007, 12/23/2008  . Zoster 12/23/2010   Assessment and Plan:  Seasonal allergic rhinitis Plan: nasal saline rinses to twice daily Continue all other respiratory/nasal medications  Bronchiectasis without complication (HCC) Plan: Continue nasal saline rinses to twice daily Continue flutter valve use Continue all other respiratory medications Explained to patient that if she starts to have increased congestion or concerns regarding her symptoms she needs to resume therapy vest use, she agreed 20-month follow-up with our office   Follow Up Instructions:  Return in about 3 months (around 05/03/2020), or if symptoms worsen or fail to improve, for Follow up with Dr. Melvyn Novas.   I discussed the assessment and treatment plan with the patient. The patient was provided an opportunity to ask questions and all were answered. The patient agreed with the plan and demonstrated an understanding of the instructions.   The patient was advised to call back or seek an in-person evaluation if the symptoms worsen or if the condition fails to improve as anticipated.  I provided 16 minutes of non-face-to-face time during this encounter.   Lauraine Rinne, NP

## 2020-02-04 ENCOUNTER — Encounter: Payer: Self-pay | Admitting: Pulmonary Disease

## 2020-02-04 ENCOUNTER — Ambulatory Visit (INDEPENDENT_AMBULATORY_CARE_PROVIDER_SITE_OTHER): Payer: Medicare PPO | Admitting: Pulmonary Disease

## 2020-02-04 ENCOUNTER — Other Ambulatory Visit: Payer: Self-pay

## 2020-02-04 DIAGNOSIS — J479 Bronchiectasis, uncomplicated: Secondary | ICD-10-CM | POA: Diagnosis not present

## 2020-02-04 DIAGNOSIS — J302 Other seasonal allergic rhinitis: Secondary | ICD-10-CM

## 2020-02-04 NOTE — Patient Instructions (Addendum)
You were seen today by Lauraine Rinne, NP  for:   1. Bronchiectasis without complication (HCC)  Bronchiectasis: This is the medical term which indicates that you have damage, dilated airways making you more susceptible to respiratory infection. Use a flutter valve 10 breaths twice a day or 4 to 5 breaths 4-5 times a day to help clear mucus out Let us know if you have cough with change in mucus color or fevers or chills.  At that point you would need an antibiotic. Maintain a healthy nutritious diet, eating whole foods Take your medications as prescribed   If you feel you're getting more congested or increased productive cough then resume therapy vest use   2. Seasonal allergic rhinitis, unspecified trigger  Continue nasal saline rinses to twice daily  Continue Singulair Continue Zyrtec  continue chlorpheniramine (aka Chlor tabs) 4 mg tablet (1 to 2 tablets at night) for management of allergies and postnasal drip at night >>> This is an over-the-counter medication >>> This medication is sedating   Follow Up:    Return in about 3 months (around 05/03/2020), or if symptoms worsen or fail to improve, for Follow up with Dr. Melvyn Novas.   Please do your part to reduce the spread of COVID-19:      Reduce your risk of any infection  and COVID19 by using the similar precautions used for avoiding the common cold or flu:  Marland Kitchen Wash your hands often with soap and warm water for at least 20 seconds.  If soap and water are not readily available, use an alcohol-based hand sanitizer with at least 60% alcohol.  . If coughing or sneezing, cover your mouth and nose by coughing or sneezing into the elbow areas of your shirt or coat, into a tissue or into your sleeve (not your hands). Langley Gauss A MASK when in public  . Avoid shaking hands with others and consider head nods or verbal greetings only. . Avoid touching your eyes, nose, or mouth with unwashed hands.  . Avoid close contact with people who are  sick. . Avoid places or events with large numbers of people in one location, like concerts or sporting events. . If you have some symptoms but not all symptoms, continue to monitor at home and seek medical attention if your symptoms worsen. . If you are having a medical emergency, call 911.   [place] / e-Visit: [email or web address]         [facility]: [facility]: [id number]                   [facility]: [id number]     It is flu season:   >>> Best ways to protect herself from the flu: Receive the yearly flu vaccine, practice good hand hygiene washing with soap and also using hand sanitizer when available, eat a nutritious meals, get adequate rest, hydrate appropriately   Please contact the office if your symptoms worsen or you have concerns that you are not improving.   Thank you for choosing [facility] for your healthcare, and for allowing [place] to partner with you on your healthcare journey. I am thankful to be able to provide care to you today.   Wyn Quaker FNP-C

## 2020-02-04 NOTE — Assessment & Plan Note (Signed)
Plan: nasal saline rinses to twice daily Continue all other respiratory/nasal medications

## 2020-02-04 NOTE — Assessment & Plan Note (Signed)
Plan: Continue nasal saline rinses to twice daily Continue flutter valve use Continue all other respiratory medications Explained to patient that if she starts to have increased congestion or concerns regarding her symptoms she needs to resume therapy vest use, she agreed 86-month follow-up with our office

## 2020-02-07 ENCOUNTER — Telehealth: Payer: Self-pay | Admitting: Pulmonary Disease

## 2020-02-07 NOTE — Telephone Encounter (Signed)
Issue wheezing?  Any fevers?  Has she been using her flutter valve as we discussed last week.Is using any saline rinses?If she even having symptoms of acid reflux?  Unsure what her point is regarding Pepcid and Protonix.  I would schedule the patient for an in office visit later on this week with Dr. Melvyn Novas with all medications and hand to further evaluate her medications.  If she needs to be seen sooner than that then schedule with an APP.  Wyn Quaker, FNP

## 2020-02-07 NOTE — Telephone Encounter (Signed)
Spoke with pt. States that all weekend she has been "dry." Reports dry cough, dry throat and chest tightness. While talking to the pt, I lost count of how many times she cleared her throat. Pt feels like all of this could be coming from Pepcid and Protonix. She is not a very good historian and trying to get any information from her was difficult.  Aaron Edelman - please advise. Thanks.

## 2020-02-07 NOTE — Telephone Encounter (Signed)
Agree. Thanks   Wyn Quaker FNP

## 2020-02-07 NOTE — Telephone Encounter (Signed)
Called and spoke to pt. Pt states she is wheezing some, increase in cough, general malaise. Pt denies f/c/s. Pt states she is also using her flutter valve as prescribed. Scheduled virtual visit with Dr. Melvyn Novas tomorrow 2.16.21 due to pt's s/s. Pt aware to seek emergency care for new or worsening s/s. Pt verbalized understanding and denied any further questions or concerns at this time.   Will forward to [facility] and Dr. Melvyn Novas as Juluis Rainier.

## 2020-02-08 ENCOUNTER — Ambulatory Visit (INDEPENDENT_AMBULATORY_CARE_PROVIDER_SITE_OTHER): Payer: Medicare PPO | Admitting: Internal Medicine

## 2020-02-08 ENCOUNTER — Encounter: Payer: Self-pay | Admitting: Internal Medicine

## 2020-02-08 ENCOUNTER — Other Ambulatory Visit: Payer: Self-pay

## 2020-02-08 DIAGNOSIS — R058 Other specified cough: Secondary | ICD-10-CM

## 2020-02-08 DIAGNOSIS — J479 Bronchiectasis, uncomplicated: Secondary | ICD-10-CM | POA: Diagnosis not present

## 2020-02-08 DIAGNOSIS — R05 Cough: Secondary | ICD-10-CM

## 2020-02-08 NOTE — Patient Instructions (Addendum)
Continue to use the vest as needed if you feel it's helping  Please schedule a follow up visit in 70months but call sooner if needed  with all medications /inhalers/ solutions in hand so we can verify exactly what you are taking. This includes all medications from all doctors and over the counters

## 2020-02-08 NOTE — Progress Notes (Signed)
[name], female    DOB: 1935/08/07, 85 y.o.   MRN: [id number]   Brief patient profile:  13 yowf  pna age 53 Quit smoking 1990 with cough then  that improved and no limits then started chronic cough since around 2000  eval by ENT 2010 dx vcd and Dr Soledad Gerlach at [facility] with dx of bronchiectasis  With first visit 2016 with worse wheezing x 2017     rx vest   History of Present Illness  02/08/2020  Pulmonary/ 1st office eval/Gokul Waybright  Dyspnea:  Walks inside house with difficulty with steps   Cough: lot of am cough white gooey texture  Sleep: 60 degrees  - worse cough supine  SABA use: albuterol up to tid /last used it 7 h prior to OV ? Helped some  Last pred one month prior to OV  ? Helped = not really sure  Having choking spells where husband has to use Heimlich not related to eating  rec Increase vest to 4 x daily  For cough >  Robitussin liquid  Per bottle and use the flutter valve as much as you can Pantoprazole (protonix) 40 mg   Take  30-60 min before first meal of the day and Pepcid (famotidine)  20 mg one after supper  until return to office  GERD diet  Please remember to go to the  x-ray department  for your tests - we will call you with the results when they are available      Virtual Visit via Telephone Note 02/08/2020   I connected with [name] on 02/08/20 at  44 AM EST by telephone and verified that I am speaking with the correct person using two identifiers.   I discussed the limitations, risks, security and privacy concerns of performing an evaluation and management service by telephone and the availability of in person appointments. I also discussed with the patient that there may be a patient responsible charge related to this service. The patient expressed understanding and agreed to proceed.   History of Present Illness: Dyspnea: around the house fine  Cough: better Sleeping: ok at 60 degrees  SABA use: none 02: none    No obvious day to day or  daytime variability or assoc excess/ purulent sputum or mucus plugs or hemoptysis or cp or chest tightness, subjective wheeze or overt sinus or hb symptoms.    Also denies any obvious fluctuation of symptoms with weather or environmental changes or other aggravating or alleviating factors except as outlined above.   Meds reviewed/ med reconciliation completed       Observations/Objective: No conversational sob, good voice texture, no spont cough   Assessment and Plan: See problem list for active a/p's   Follow Up Instructions: See avs for instructions unique to this ov which includes revised/ updated med list     I discussed the assessment and treatment plan with the patient. The patient was provided an opportunity to ask questions and all were answered. The patient agreed with the plan and demonstrated an understanding of the instructions.   The patient was advised to call back or seek an in-person evaluation if the symptoms worsen or if the condition fails to improve as anticipated.  I provided 15 minutes of non-face-to-face time during this encounter.   Christinia Gully, MD           Past Medical History:  Diagnosis Date  . Anxiety   . Bronchiectasis without complication ([place])  pulmologist-  dr Soledad Gerlach [facility])-- last chest CT 11/ 2017;  last exacerbation possible pneumonia 11/ 2017  resolved per pt  . Cataract of both eyes    mature  . Chronic cough    NON-PRODUCTIVE  . DDD (degenerative disc disease), cervical   . Depression   . GERD (gastroesophageal reflux disease)    watches diet very carefully and takes zantac/gaviscon prn  . Grade III internal hemorrhoids   . History of Clostridium difficile colitis    2012-- resolved  . History of concussion    1973 approx--  no residual  . History of iron deficiency anemia   . History of recurrent pneumonia    last bout possible bacterial 11/ 2017   . IBS (irritable bowel syndrome)   . Mixed stress and urge urinary  incontinence   . Moderate persistent asthma    pulmologist-  dr Soledad Gerlach [facility])    . OA (osteoarthritis)    hands, knees  . OAB (overactive bladder)   . OAB (overactive bladder)   . Osteopenia   . Renal cyst, left    simple-- per urologist note (dr Alona Bene, [facility])  . Seasonal allergic rhinitis   . Wears glasses   . Wears hearing aid    bilateral---  left ear is better than right

## 2020-02-08 NOTE — Assessment & Plan Note (Signed)
Clinical dx By Dr Soledad Gerlach at [facility] rx bid as of 12/30/2019 > rec increase to qid and continue flutter valve (new one given)   Adequate control on present rx, reviewed in detail with pt > no change in rx needed

## 2020-02-08 NOTE — Assessment & Plan Note (Addendum)
Original dx of VCD around 2000 ent at [place] with dyphagia under care of ST   - Flutter valve new rx 12/30/2019   Adequate control on present rx, reviewed in detail with pt > no change in rx needed    Completing covid 19 vaccination this week  F/u can be q 6 m, call sooner prn    Each maintenance medication was reviewed in detail including most importantly the difference between maintenance and as needed and under what circumstances the prns are to be used.  Please see AVS for specific  Instructions which are unique to this visit and I personally typed out  which were reviewed in detail over the phone with the patient and a copy provided via MyChart

## 2020-02-10 ENCOUNTER — Ambulatory Visit: Payer: Medicare PPO | Admitting: Internal Medicine

## 2020-05-16 ENCOUNTER — Other Ambulatory Visit: Payer: Self-pay | Admitting: Obstetrics & Gynecology

## 2020-05-16 NOTE — Telephone Encounter (Signed)
Medication refill request: Fluoxetine Last AEX:  05/14/19 SM Next AEX: none scheduled Last MMG (if hormonal medication request): 05/13/19 BIRADS 1 negative Refill authorized: Please advise on refill  Tried calling patient to schedule AEX, busy signal on home number and unable to leave a voicemail on mobile number.

## 2020-05-18 ENCOUNTER — Other Ambulatory Visit: Payer: Self-pay

## 2020-05-18 NOTE — Telephone Encounter (Signed)
Opened in error

## 2020-05-25 NOTE — Telephone Encounter (Signed)
[facility] called checking status of refill request. [phone]

## 2020-05-25 NOTE — Telephone Encounter (Signed)
Routing to provider to advise.  

## 2020-07-15 ENCOUNTER — Other Ambulatory Visit: Payer: Self-pay | Admitting: Obstetrics & Gynecology

## 2020-07-15 DIAGNOSIS — N3941 Urge incontinence: Secondary | ICD-10-CM

## 2020-07-17 NOTE — Telephone Encounter (Signed)
Medication refill request: Myrbetriq ER 500mg  Last AEX:  05/14/19 Next AEX: 10/09/20 Last MMG (if hormonal medication request): 05/13/19  Normal Refill authorized: #[id number]

## 2020-08-08 ENCOUNTER — Other Ambulatory Visit: Payer: Self-pay

## 2020-08-08 ENCOUNTER — Ambulatory Visit (INDEPENDENT_AMBULATORY_CARE_PROVIDER_SITE_OTHER): Payer: Medicare PPO | Admitting: Internal Medicine

## 2020-08-08 ENCOUNTER — Encounter: Payer: Self-pay | Admitting: Internal Medicine

## 2020-08-08 DIAGNOSIS — R058 Other specified cough: Secondary | ICD-10-CM

## 2020-08-08 DIAGNOSIS — R05 Cough: Secondary | ICD-10-CM | POA: Diagnosis not present

## 2020-08-08 DIAGNOSIS — J479 Bronchiectasis, uncomplicated: Secondary | ICD-10-CM | POA: Diagnosis not present

## 2020-08-08 MED ORDER — AZELASTINE-FLUTICASONE 137-50 MCG/ACT NA SUSP
1.0000 | Freq: Two times a day (BID) | NASAL | 11 refills | Status: DC
Start: 2020-08-08 — End: 2020-10-03

## 2020-08-08 NOTE — Assessment & Plan Note (Signed)
Original dx of VCD around 2000 ent at [place] with dyphagia under care of ST   - Flutter valve new rx 12/30/2019  - better 08/08/2020 with use of  1st gen H1 blockers per guidelines   - added trial of dymista one bid / demonstrated how to use it as maint rx and change chlorpheniramine to prn    Upper airway cough syndrome (previously labeled PNDS),  is so named because it's frequently impossible to sort out how much is  CR/sinusitis with freq throat clearing (which can be related to primary GERD)   vs  causing  secondary (" extra esophageal")  GERD from wide swings in gastric pressure that occur with throat clearing, often  promoting self use of mint and menthol lozenges that reduce the lower esophageal sphincter tone and exacerbate the problem further in a cyclical fashion.   These are the same pts (now being labeled as having "irritable larynx syndrome" by some cough centers) who not infrequently have a history of having failed to tolerate ace inhibitors,  dry powder inhalers or biphosphonates or report having atypical/extraesophageal reflux symptoms that don't respond to standard doses of PPI  and are easily confused as having aecopd or asthma flares by even experienced allergists/ pulmonologists (myself included).   Try aggressive rx of rhinitis and f/u q 6 m sooner prn  with all meds in hand using a trust but verify approach to confirm accurate Medication  Reconciliation The principal here is that until we are certain that the  patients are doing what we've asked, it makes no sense to ask them to do more.    To keep things simple, I have asked the patient to first separate medicines that are perceived as maintenance, that is to be taken daily "no matter what", from those medicines that are taken on only on an as-needed basis and I have given the patient examples of both, and then return to see  Me with separate bags and go from there in terms of making sure maint vs prns are clear.            Each maintenance medication was reviewed in detail including emphasizing most importantly the difference between maintenance and prns and under what circumstances the prns are to be triggered using an action plan format where appropriate.  Total time for H and P, chart review, counseling, teaching device and generating customized AVS unique to this office visit / charting = 22

## 2020-08-08 NOTE — Progress Notes (Signed)
[name], female    DOB: 1935/02/26, 85 y.o.   MRN: [id number]   Brief patient profile:  25 yowf  pna age 25 Quit smoking 1990 with cough then  that improved and no limits then started chronic cough since around 2000  eval by ENT 2010 dx vcd and Dr Soledad Gerlach at [facility] with dx of bronchiectasis  With first visit 2016 with worse wheezing x 2017     rx vest   History of Present Illness  12/30/2019  Pulmonary/ 1st office eval/Simpson Paulos  Chief Complaint  Patient presents with   Pulmonary Consult    Self referral for "vocal cord dysfunction". Pt c/o congestion and mucus in her throat, esp in the am's. She has cough with clear, thick sputum. She uses her albuterol inhaler 2-4 x per day.   Dyspnea:  Walks inside house with difficulty with steps   Cough: lot of am cough white gooey texture  Sleep: 60 degrees  - worse cough supine  SABA use: albuterol up to tid /last used it 7 h prior to OV ? Helped some  Last pred one month prior to OV  ? Helped = not really sure  Having choking spells where husband has to use Heimlich not related to eating  rec Continue to use the vest as needed if you feel it's helping Please schedule a follow up visit in 62months but call sooner if needed  with all medications /inhalers/ solutions in hand so we can verify exactly what you are taking. This includes all medications from all doctors and over the counters   08/08/2020  f/u ov/[name] re: rhinitis/ pnds much better with 1st gen H1 blockers per guidelines  - brought meds in a single pill jar   Chief Complaint  Patient presents with   Follow-up    pt states allergies are bothering.pt states coughing up clear musc  Dyspnea:   Not limited by breathing from desired activities  / doesn't like heat/ humidity Cough: nettipot helps Sleeping: 45 degrees on pillows due to pnds  SABA use: usually just once in am / occ in pm  02: none    No obvious day to day or daytime variability or assoc   purulent sputum or mucus  plugs or hemoptysis or cp or chest tightness, subjective wheeze or overt sinus or hb symptoms.    . Also denies any obvious fluctuation of symptoms with weather or environmental changes or other aggravating or alleviating factors except as outlined above   No unusual exposure hx or h/o childhood pna/ asthma or knowledge of premature birth.  Current Allergies, Complete Past Medical History, Past Surgical History, Family History, and Social History were reviewed in [employer] record.  ROS  The following are not active complaints unless bolded Hoarseness, sore throat, dysphagia/globus, dental problems, itching, sneezing,  nasal congestion or discharge of excess mucus or purulent secretions, ear ache,   fever, chills, sweats, unintended wt loss or wt gain, classically pleuritic or exertional cp,  orthopnea pnd or arm/hand swelling  or leg swelling, presyncope, palpitations, abdominal pain, anorexia, nausea, vomiting, diarrhea  or change in bowel habits or change in bladder habits, change in stools or change in urine, dysuria, hematuria,  rash, arthralgias, visual complaints, headache, numbness, weakness or ataxia or problems with walking or coordination,  change in mood or  memory.        Current Meds - - NOTE:   Unable to verify as accurately reflecting what pt takes  Medication Sig   albuterol (PROAIR HFA) 108 (90 Base) MCG/ACT inhaler Inhale into the lungs every 6 (six) hours as needed for wheezing or shortness of breath.   Alum Hydroxide-Mag Carbonate (GAVISCON EXTRA STRENGTH PO) Take 1 tablet by mouth as needed.    azelastine (ASTELIN) 0.1 % nasal spray Place 2 sprays into both nostrils 2 (two) times daily. Use in each nostril as directed   Calcium Carb-Cholecalciferol (CALCIUM 1000 + D PO) Take 1 capsule by mouth 2 (two) times daily.   cetirizine (ZYRTEC) 10 MG tablet Take 10 mg by mouth as needed for allergies.   chlorpheniramine (CHLOR-TRIMETON) 4 MG tablet  Take 4 mg by mouth every 4 (four) hours as needed for allergies.   Cholecalciferol (VITAMIN D) 2000 UNITS CAPS Take 1 capsule by mouth every morning.    Cyanocobalamin (VITAMIN B-12) 2500 MCG SUBL Place under the tongue daily.    famotidine (PEPCID) 20 MG tablet One after supper   FLUoxetine (PROZAC) 20 MG capsule TAKE (1) CAPSULE DAILY   folic acid (FOLVITE) 924 MCG tablet Take 800 mcg by mouth daily.    hypromellose (GENTEAL) 0.3 % GEL ophthalmic ointment Place 1 application into both eyes as needed (dry eyes).   Magnesium 400 MG CAPS Take 1 capsule by mouth daily.    montelukast (SINGULAIR) 10 MG tablet Take 10 mg by mouth at bedtime.   MYRBETRIQ 50 MG TB24 tablet TAKE 1 TABLET DAILY   ondansetron (ZOFRAN) 4 MG tablet Take 1 tablet (4 mg total) by mouth 2 (two) times daily as needed for nausea or vomiting.   pantoprazole (PROTONIX) 40 MG tablet Take 1 tablet (40 mg total) by mouth daily. Take 30-60 min before first meal of the day   Probiotic Product (PROBIOTIC PO) Take 1 capsule by mouth 2 (two) times daily.    Respiratory Therapy Supplies (FLUTTER) DEVI Use as directed   vitamin E 400 UNIT capsule Take 400 Units by mouth daily.   [DISCONTINUED] ibuprofen (ADVIL,MOTRIN) 200 MG tablet Take 200 mg by mouth every 6 (six) hours as needed.                   Past Medical History:  Diagnosis Date   Anxiety    Bronchiectasis without complication [facility])    pulmologist-  dr Soledad Gerlach [facility])-- last chest CT 11/ 2017;  last exacerbation possible pneumonia 11/ 2017  resolved per pt   Cataract of both eyes    mature   Chronic cough    NON-PRODUCTIVE   DDD (degenerative disc disease), cervical    Depression    GERD (gastroesophageal reflux disease)    watches diet very carefully and takes zantac/gaviscon prn   Grade III internal hemorrhoids    History of Clostridium difficile colitis    2012-- resolved   History of concussion    1973 approx--  no residual    History of iron deficiency anemia    History of recurrent pneumonia    last bout possible bacterial 11/ 2017    IBS (irritable bowel syndrome)    Mixed stress and urge urinary incontinence    Moderate persistent asthma    pulmologist-  dr Soledad Gerlach [facility])     OA (osteoarthritis)    hands, knees   OAB (overactive bladder)    OAB (overactive bladder)    Osteopenia    Renal cyst, left    simple-- per urologist note (dr Alona Bene, [facility])   Seasonal allergic rhinitis    Wears  glasses    Wears hearing aid    bilateral---  left ear is better than right       Objective:      amb wf nad/ dry sounding cough on voluntary maneuver   Wt Readings from Last 3 Encounters:  08/08/20 125 lb 9.6 oz (57 kg)  12/30/19 124 lb 9.6 oz (56.5 kg)  10/05/19 128 lb (58.1 kg)     Vital signs reviewed - Note on arrival 08/08/2020  02 sats  97% on RA     HEENT : pt wearing mask not removed for exam due to covid -19 concerns.    NECK :  without JVD/Nodes/TM/ nl carotid upstrokes bilaterally   LUNGS: no acc muscle use,  Nl contour chest which is clear to A and P bilaterally without cough on insp or exp maneuvers   CV:  RRR  no s3 or murmur or increase in P2, and no edema   ABD:  soft and nontender with nl inspiratory excursion in the supine position. No bruits or organomegaly appreciated, bowel sounds nl  MS:  Nl gait/ ext warm without deformities, calf tenderness, cyanosis or clubbing No obvious joint restrictions   SKIN: warm and dry without lesions    NEURO:  alert, approp, nl sensorium with  no motor or cerebellar deficits apparent.          Assessment

## 2020-08-08 NOTE — Assessment & Plan Note (Signed)
Clinical dx By Dr Soledad Gerlach at [facility] rx bid as of 12/30/2019 > rec increase to qid and continue flutter valve (new one given)  >>>>  She's not needing as much now as she was but wants to continue at least twice daily prn which is fine but if worse chest congestion should be qid

## 2020-08-08 NOTE — Patient Instructions (Addendum)
Dysmista one puff twice a day each nostril x one month supply  For drainage / throat tickle try take CHLORPHENIRAMINE  4 mg  (Chlortab 4mg   at [employer] should be easiest to find in the green box)  take one every 4 hours as needed - available over the counter- may cause drowsiness so start with just a bedtime dose or two and see how you tolerate it before trying in daytime      Please schedule a follow up office visit in 6 weeks, call sooner if needed with all medications /inhalers/ solutions in hand so we can verify exactly what you are taking. This includes all medications from all doctors and over the [place] separate them into two bags:  the ones you take automatically, no matter what, vs the ones you take just when you feel you need them "BAG #2 is UP TO YOU"  - this will really help [place] help you take your medications more effectively.

## 2020-08-14 ENCOUNTER — Other Ambulatory Visit: Payer: Self-pay | Admitting: Obstetrics & Gynecology

## 2020-08-14 DIAGNOSIS — Z1231 Encounter for screening mammogram for malignant neoplasm of breast: Secondary | ICD-10-CM

## 2020-08-16 ENCOUNTER — Telehealth: Payer: Self-pay | Admitting: Internal Medicine

## 2020-08-16 MED ORDER — AZITHROMYCIN 250 MG PO TABS
ORAL_TABLET | ORAL | 0 refills | Status: DC
Start: 2020-08-16 — End: 2020-10-03

## 2020-08-16 NOTE — Telephone Encounter (Signed)
Primary Pulmonologist: Wert Last office visit and with whom: 08/08/20 with Wert What do we see them for (pulmonary problems): bronchiectasis Last OV assessment/plan:  Assessment        Assessment & Plan Note by Tanda Rockers, MD at 08/08/2020 2:03 PM Author: Tanda Rockers, MD Author Type: Physician Filed: 08/08/2020 2:04 PM  Note Status: Written Cosign: Cosign Not Required Encounter Date: 08/08/2020  Problem: Upper airway cough syndrome/ VCD  Editor: Tanda Rockers, MD (Physician)                 Original dx of VCD around 2000 ent at [place] with dyphagia under care of ST   - Flutter valve new rx 12/30/2019  - better 08/08/2020 with use of  1st gen H1 blockers per guidelines   - added trial of dymista one bid / demonstrated how to use it as maint rx and change chlorpheniramine to prn    Upper airway cough syndrome (previously labeled PNDS),  is so named because it's frequently impossible to sort out how much is  CR/sinusitis with freq throat clearing (which can be related to primary GERD)   vs  causing  secondary (" extra esophageal")  GERD from wide swings in gastric pressure that occur with throat clearing, often  promoting self use of mint and menthol lozenges that reduce the lower esophageal sphincter tone and exacerbate the problem further in a cyclical fashion.   These are the same pts (now being labeled as having "irritable larynx syndrome" by some cough centers) who not infrequently have a history of having failed to tolerate ace inhibitors,  dry powder inhalers or biphosphonates or report having atypical/extraesophageal reflux symptoms that don't respond to standard doses of PPI  and are easily confused as having aecopd or asthma flares by even experienced allergists/ pulmonologists (myself included).   Try aggressive rx of rhinitis and f/u q 6 m sooner prn  with all meds in hand using a trust but verify approach to confirm accurate Medication  Reconciliation The principal  here is that until we are certain that the  patients are doing what we've asked, it makes no sense to ask them to do more.    To keep things simple, I have asked the patient to first separate medicines that are perceived as maintenance, that is to be taken daily "no matter what", from those medicines that are taken on only on an as-needed basis and I have given the patient examples of both, and then return to see  Me with separate bags and go from there in terms of making sure maint vs prns are clear.           Each maintenance medication was reviewed in detail including emphasizing most importantly the difference between maintenance and prns and under what circumstances the prns are to be triggered using an action plan format where appropriate.  Total time for H and P, chart review, counseling, teaching device and generating customized AVS unique to this office visit / charting = 22         Assessment & Plan Note by Tanda Rockers, MD at 08/08/2020 2:01 PM Author: Tanda Rockers, MD Author Type: Physician Filed: 08/08/2020 2:02 PM  Note Status: Written Cosign: Cosign Not Required Encounter Date: 08/08/2020  Problem: Bronchiectasis without complication [facility])  Editor: Tanda Rockers, MD (Physician)                 Clinical dx By Dr Soledad Gerlach  at [facility] rx bid as of 12/30/2019 > rec increase to qid and continue flutter valve (new one given)  >>>>  She's not needing as much now as she was but wants to continue at least twice daily prn which is fine but if worse chest congestion should be qid     Patient Instructions by Tanda Rockers, MD at 08/08/2020 1:30 PM Author: Tanda Rockers, MD Author Type: Physician Filed: 08/08/2020 1:55 PM  Note Status: Addendum Cosign: Cosign Not Required Encounter Date: 08/08/2020  Editor: Tanda Rockers, MD (Physician)      Prior Versions: 1. Tanda Rockers, MD (Physician) at 08/08/2020 1:51 PM - Addendum   2. Tanda Rockers, MD (Physician) at  08/08/2020 1:48 PM - Signed      Dysmista one puff twice a day each nostril x one month supply  For drainage / throat tickle try take CHLORPHENIRAMINE  4 mg  (Chlortab 4mg   at [employer] should be easiest to find in the green box)  take one every 4 hours as needed - available over the counter- may cause drowsiness so start with just a bedtime dose or two and see how you tolerate it before trying in daytime      Please schedule a follow up office visit in 6 weeks, call sooner if needed with all medications /inhalers/ solutions in hand so we can verify exactly what you are taking. This includes all medications from all doctors and over the [place] separate them into two bags:  the ones you take automatically, no matter what, vs the ones you take just when you feel you need them "BAG #2 is UP TO YOU"  - this will really help [place] help you take your medications more effectively.        Was appointment offered to patient (explain)?  Pt wants recommendations   Reason for call: Called and spoke with pt who stated she has been having complaints of headache, sinus congestion, and chest congestion which she believes is turning into bronchitis. Pt stated symptoms began 4 days ago. Pt stated that she is coughing and states the phlegm is thicker than it usually is. Pt states last time she checked her temp today was 98.5. Pt states that she has been wheezing a lot and also has tightness in her chest along with the congestion. Pt has had to use her rescue inhaler at least twice daily to see if she could get some relief. Pt is using all other meds as prescribed.   Pt is wondering if there is something that could be prescribed to help with her symptoms. Dr. Melvyn Novas, please advise.    Allergies  Allergen Reactions  . Chocolate Flavor Nausea Only  . Lactose Nausea Only  . Latex Itching    Skin reddness Skin reddness  . Penicillins Other (See Comments)    Arthritis hands/feet  . Statins  Other (See Comments)  . Tylenol With Codeine #3 [Acetaminophen-Codeine] Other (See Comments)    Nausea, dizziness  . Other Palpitations and Other (See Comments)    Pain meds make her shaky, several years ago.    Immunization History  Administered Date(s) Administered  . Influenza Split 09/22/2018  . Influenza, High Dose Seasonal PF 10/16/2012, 10/29/2013, 09/29/2015, 10/31/2016, 10/03/2017, 09/25/2018  . Influenza-Unspecified 09/29/2009, 10/16/2012, 10/29/2013, 09/29/2015, 10/31/2016, 10/03/2017, 09/22/2018  . Moderna SARS-COVID-2 Vaccination 01/15/2020, 02/12/2020  . Pneumococcal Conjugate-13 01/22/2013  . Pneumococcal Polysaccharide-23 04/23/2013  . Pneumococcal-Unspecified 12/23/2008, 01/22/2013  .  Tdap 12/24/2007, 12/23/2008  . Zoster 12/23/2010

## 2020-08-16 NOTE — Telephone Encounter (Signed)
z-pak 

## 2020-08-16 NOTE — Telephone Encounter (Signed)
Patient aware of Dr. Gustavus Bryant recommendations. She was worried about C-diff, I told her it was a short length of time antibiotic and she can take her medication with yogurt. Patient voiced understanding. Nothing further needed at this time.

## 2020-08-21 ENCOUNTER — Telehealth: Payer: Self-pay | Admitting: Internal Medicine

## 2020-08-21 MED ORDER — PREDNISONE 10 MG PO TABS: ORAL_TABLET | ORAL | 0 refills | Status: DC

## 2020-08-21 MED ORDER — LEVOFLOXACIN 500 MG PO TABS
500.0000 mg | ORAL_TABLET | Freq: Every day | ORAL | 0 refills | Status: AC
Start: 2020-08-21 — End: 2020-08-31

## 2020-08-21 NOTE — Telephone Encounter (Signed)
Prednisone 10 mg take  4 each am x 2 days,   2 each am x 2 days,  1 each am x 2 days and stop  levaquin 500 mg daily x 7 days

## 2020-08-21 NOTE — Telephone Encounter (Signed)
Spoke with the pt and notified of recs per Dr Melvyn Novas She verbalized understanding  Rxs were sent to [facility]

## 2020-08-21 NOTE — Telephone Encounter (Signed)
Patient had a sick call on 08/16/20, on Z-Pack, reports not feeling better, cough is worse, SOB increased, still wheezing, using Albuterol 2-4 times a day in nebulizer, copious secretions with cough, no color. Requesting Dr. Melvyn Novas give her "something stronger". Please advise.

## 2020-10-03 ENCOUNTER — Other Ambulatory Visit: Payer: Self-pay

## 2020-10-03 ENCOUNTER — Ambulatory Visit (INDEPENDENT_AMBULATORY_CARE_PROVIDER_SITE_OTHER): Payer: Medicare PPO | Admitting: Internal Medicine

## 2020-10-03 ENCOUNTER — Encounter: Payer: Self-pay | Admitting: Internal Medicine

## 2020-10-03 ENCOUNTER — Ambulatory Visit (INDEPENDENT_AMBULATORY_CARE_PROVIDER_SITE_OTHER): Payer: Medicare PPO

## 2020-10-03 DIAGNOSIS — J479 Bronchiectasis, uncomplicated: Secondary | ICD-10-CM | POA: Diagnosis not present

## 2020-10-03 DIAGNOSIS — R058 Other specified cough: Secondary | ICD-10-CM

## 2020-10-03 MED ORDER — PREDNISONE 10 MG PO TABS: ORAL_TABLET | ORAL | 0 refills | Status: AC

## 2020-10-03 MED ORDER — BUDESONIDE-FORMOTEROL FUMARATE 80-4.5 MCG/ACT IN AERO: INHALATION_SPRAY | RESPIRATORY_TRACT | 11 refills | Status: AC

## 2020-10-03 NOTE — Patient Instructions (Addendum)
Plan A = Automatic = Always=    symbicort 80 Take 2 puffs first thing in am and then another 2 puffs about 12 hours later.   Work on inhaler technique:  relax and gently blow all the way out then take a nice smooth deep breath back in, triggering the inhaler at same time you start breathing in.  Hold for up to 5 seconds if you can. Blow out thru nose. Rinse and gargle with water when done  Prednisone 10 mg take  4 each am x 2 days,   2 each am x 2 days,  1 each am x 2 days and stop     Plan B = Backup (to supplement plan A, not to replace it) Only use your albuterol inhaler as a rescue medication to be used if you can't catch your breath by resting or doing a relaxed purse lip breathing pattern.  - The less you use it, the better it will work when you need it. - Ok to use the inhaler up to 2 puffs  every 4 hours if you must but call for appointment if use goes up over your usual need - Don't leave home without it !!  (think of it like the spare tire for your car)    Please remember to go to the  x-ray department  for your tests - we will call you with the results when they are available    Please schedule a follow up office visit in 4 weeks, sooner if needed - bring inhalers

## 2020-10-03 NOTE — Progress Notes (Signed)
[name], female    DOB: [birth year]    MRN: [id number]   Brief patient profile:  85 yowf  pna age 85 Quit smoking 1990 with cough then  that improved and no limits then started chronic cough since around 2000  eval by ENT 2010 dx vcd and Dr Soledad Gerlach at [facility] with dx of bronchiectasis  With first visit 2016 with worse wheezing x 2017     rx vest   History of Present Illness  12/30/2019  Pulmonary/ 1st office eval/[name]  Chief Complaint  Patient presents with  . Pulmonary Consult    Self referral for "vocal cord dysfunction". Pt c/o congestion and mucus in her throat, esp in the am's. She has cough with clear, thick sputum. She uses her albuterol inhaler 2-4 x per day.   Dyspnea:  Walks inside house with difficulty with steps   Cough: lot of am cough white gooey texture  Sleep: 60 degrees  - worse cough supine  SABA use: albuterol up to tid /last used it 7 h prior to OV ? Helped some  Last pred one month prior to OV  ? Helped = not really sure  Having choking spells where husband has to use Heimlich not related to eating  rec Continue to use the vest as needed if you feel it's helping Please schedule a follow up visit in 83months but call sooner if needed  with all medications /inhalers/ solutions in hand so we can verify exactly what you are taking. This includes all medications from all doctors and over the counters   08/08/2020  f/u ov/[name] re: rhinitis/ pnds much better with 1st gen H1 blockers per guidelines  - brought meds in a single pill jar   Chief Complaint  Patient presents with  . Follow-up    pt states allergies are bothering.pt states coughing up clear musc  Dyspnea:   Not limited by breathing from desired activities  / doesn't like heat/ humidity Cough: nettipot helps Sleeping: 45 degrees on pillows due to pnds  SABA use: usually just once in am / occ in pm  02: none  rec Dysmista one puff twice a day each nostril x one month supply > nose bleeding  For  drainage / throat tickle try take CHLORPHENIRAMINE  4 mg    10/03/2020  f/u ov/[name] re: cough x 20 years,  Esp bad x last 2 m prior to [place]  / pnds better with 1st gen H1 blockers per guidelines  Than dymista which made her nose bleed Chief Complaint  Patient presents with  . Follow-up    SOB, wheezing and productive cough  Dyspnea:  Better p saba  Cough: white mucus only  Sleeping: no noct symptoms SABA use: twice daily seems to help cough and wheeze both  02 none    No obvious day to day or daytime variability or assoc   purulent sputum or mucus plugs or hemoptysis or cp or chest tightness, subjective wheeze or overt sinus or hb symptoms.   Sleeping  without nocturnal  or early am exacerbation  of respiratory  c/o's or need for noct saba. Also denies any obvious fluctuation of symptoms with weather or environmental changes or other aggravating or alleviating factors except as outlined above   No unusual exposure hx or h/o childhood pna/ asthma or knowledge of premature birth.  Current Allergies, Complete Past Medical History, Past Surgical History, Family History, and Social History were reviewed in [employer]  record.  ROS  The following are not active complaints unless bolded Hoarseness, sore throat, dysphagia, dental problems, itching, sneezing,  nasal congestion or discharge of excess mucus or purulent secretions, ear ache,   fever, chills, sweats, unintended wt loss or wt gain, classically pleuritic or exertional cp,  orthopnea pnd or arm/hand swelling  or leg swelling, presyncope, palpitations, abdominal pain, anorexia, nausea, vomiting, diarrhea  or change in bowel habits or change in bladder habits, change in stools or change in urine, dysuria, hematuria,  rash, arthralgias, visual complaints, headache, numbness, weakness or ataxia or problems with walking or coordination,  change in mood or  memory.        Current Meds  Medication Sig  . albuterol (PROAIR  HFA) 108 (90 Base) MCG/ACT inhaler Inhale into the lungs every 6 (six) hours as needed for wheezing or shortness of breath.  . Alum Hydroxide-Mag Carbonate (GAVISCON EXTRA STRENGTH PO) Take 1 tablet by mouth as needed.   . Azelastine-Fluticasone (DYMISTA) 137-50 MCG/ACT SUSP Not using   . azithromycin (ZITHROMAX) 250 MG tablet Take 2 tablets (500mg  total) today,then 1 tablet (250mg ) x4 days  . Calcium Carb-Cholecalciferol (CALCIUM 1000 + D PO) Take 1 capsule by mouth 2 (two) times daily.  . cetirizine (ZYRTEC) 10 MG tablet Take 10 mg by mouth as needed for allergies.  . chlorpheniramine (CHLOR-TRIMETON) 4 MG tablet Take 4 mg by mouth every 4 (four) hours as needed for allergies.  . Cholecalciferol (VITAMIN D) 2000 UNITS CAPS Take 1 capsule by mouth every morning.   . Cyanocobalamin (VITAMIN B-12) 2500 MCG SUBL Place under the tongue daily.   . famotidine (PEPCID) 20 MG tablet One after supper  . FLUoxetine (PROZAC) 20 MG capsule TAKE (1) CAPSULE DAILY  . folic acid (FOLVITE) 384 MCG tablet Take 800 mcg by mouth daily.   . hypromellose (GENTEAL) 0.3 % GEL ophthalmic ointment Place 1 application into both eyes as needed (dry eyes).  . Magnesium 400 MG CAPS Take 1 capsule by mouth daily.   . montelukast (SINGULAIR) 10 MG tablet Take 10 mg by mouth at bedtime.  Marland Kitchen MYRBETRIQ 50 MG TB24 tablet TAKE 1 TABLET DAILY  . ondansetron (ZOFRAN) 4 MG tablet Take 1 tablet (4 mg total) by mouth 2 (two) times daily as needed for nausea or vomiting.  . pantoprazole (PROTONIX) 40 MG tablet Take 1 tablet (40 mg total) by mouth daily. Take 30-60 min before first meal of the day  . predniSONE (DELTASONE) 10 MG tablet 4 x 2 days, 2 x 2 1 x 2 days and then stop  . Probiotic Product (PROBIOTIC PO) Take 1 capsule by mouth 2 (two) times daily.   Marland Kitchen Respiratory Therapy Supplies (FLUTTER) DEVI Use as directed  . vitamin E 400 UNIT capsule Take 400 Units by mouth daily.                    Past Medical History:   Diagnosis Date  . Anxiety   . Bronchiectasis without complication [facility])    pulmologist-  dr Soledad Gerlach [facility])-- last chest CT 11/ 2017;  last exacerbation possible pneumonia 11/ 2017  resolved per pt  . Cataract of both eyes    mature  . Chronic cough    NON-PRODUCTIVE  . DDD (degenerative disc disease), cervical   . Depression   . GERD (gastroesophageal reflux disease)    watches diet very carefully and takes zantac/gaviscon prn  . Grade III internal hemorrhoids   . History of  Clostridium difficile colitis    2012-- resolved  . History of concussion    1973 approx--  no residual  . History of iron deficiency anemia   . History of recurrent pneumonia    last bout possible bacterial 11/ 2017   . IBS (irritable bowel syndrome)   . Mixed stress and urge urinary incontinence   . Moderate persistent asthma    pulmologist-  dr Soledad Gerlach [facility])    . OA (osteoarthritis)    hands, knees  . OAB (overactive bladder)   . OAB (overactive bladder)   . Osteopenia   . Renal cyst, left    simple-- per urologist note (dr Alona Bene, [facility])  . Seasonal allergic rhinitis   . Wears glasses   . Wears hearing aid    bilateral---  left ear is better than right       Objective:    amb thin wf with freq throat clearing    10/03/2020      126 08/08/20 125 lb 9.6 oz (57 kg)  12/30/19 124 lb 9.6 oz (56.5 kg)  10/05/19 128 lb (58.1 kg)      Vital signs reviewed  10/03/2020  - Note at rest 02 sats  98% on RA    HEENT : pt wearing mask not removed for exam due to covid -19 concerns.    NECK :  without JVD/Nodes/TM/ nl carotid upstrokes bilaterally   LUNGS: no acc muscle use,  Nl contour chest with minimal insp/exp rhonchi  bilaterally without cough on insp or exp maneuvers   CV:  RRR  no s3 or murmur or increase in P2, and no edema   ABD:  soft and nontender with nl inspiratory excursion in the supine position. No bruits or organomegaly appreciated, bowel sounds nl  MS:  Nl  gait/ ext warm without deformities, calf tenderness, cyanosis or clubbing No obvious joint restrictions   SKIN: warm and dry without lesions    NEURO:  alert, approp, nl sensorium with  no motor or cerebellar deficits apparent.       CXR PA and Lateral:   10/03/2020 :    I personally reviewed images and  impression as follows:   Min non specific increase markings no change vs priors            Assessment

## 2020-10-04 ENCOUNTER — Encounter: Payer: Self-pay | Admitting: Internal Medicine

## 2020-10-04 NOTE — Assessment & Plan Note (Signed)
Original dx of VCD around 2000 ent at [place] with dyphagia under care of ST   - Flutter valve new rx 12/30/2019  - better 08/08/2020 with use of  1st gen H1 blockers per guidelines   - added trial of dymista one bid / demonstrated how to use it as maint rx and change chlorpheniramine to prn > d/c'd dymista due to nose bleeds  rec continue 1st gen H1 blockers per guidelines  And also  added 6 days of Prednisone in case of component of Th-2 driven upper or lower airways inflammation (if cough responds short term only to relapse befor return while will on rx for uacs that would point to allergic rhinitis/ asthma or eos bronchitis)            Each maintenance medication was reviewed in detail including emphasizing most importantly the difference between maintenance and prns and under what circumstances the prns are to be triggered using an action plan format where appropriate.  Total time for H and P, chart review, counseling, teaching device and generating customized AVS unique to this office visit / charting  > 30 min

## 2020-10-04 NOTE — Assessment & Plan Note (Signed)
Clinical dx By Dr Soledad Gerlach at [facility] rx bid as of 12/30/2019 > rec increase to qid and continue flutter valve (new one given) -  10/03/2020  After extensive coaching inhaler device,  effectiveness =    75% > try symbicort 80 2bid   Cough that improves p saba may well respond to symb 80 2bid if she'll use it regularly then see less need for saba/ reviewed:  I spent extra time with pt today reviewing appropriate use of albuterol for prn use on exertion with the following points: 1) saba is for relief of sob that does not improve by walking a slower pace or resting but rather if the pt does not improve after trying this first. 2) If the pt is convinced, as many are, that saba helps recover from activity faster then it's easy to tell if this is the case by re-challenging : ie stop, take the inhaler, then p 5 minutes try the exact same activity (intensity of workload) that just caused the symptoms and see if they are substantially diminished or not after saba 3) if there is an activity that reproducibly causes the symptoms, try the saba 15 min before the activity on alternate days   If in fact the saba really does help, then fine to continue to use it prn but advised may need to look closer at the maintenance regimen being used to achieve better control of airways disease with exertion.

## 2020-10-05 NOTE — Progress Notes (Signed)
85 y.o. G19P2002 Widowed White or Caucasian female here for breast and pelvic exam.  Denies vaginal bleeding.  Spouse of 63 years died earlier this year.  This has been very hard for her.  I see pt's daughter so I was aware that pt's spouse passed this year.  Has primary progressive aphasia.  She has some issues with word recall with me today as well.  Last appt was 06/29/2020 with Dr. Leonarda Salon, Geriatrics at [facility]  I follow her for her OAB.    Patient's last menstrual period was 12/23/1989.          Sexually active: No.  H/O STD:  no  Health Maintenance: PCP:  Beckey Rutter  Last wellness appt was last year--03/2019  Did blood work at that appt: yes  Vaccines are up to date:  Yes except had Zostavax and has not had Shingrix Colonoscopy:  01-15-18  MMG:  05-13-2019 category b density birads 1:neg BMD:  06-02-09 osteopenia.  Declines having this done again as she would not take any more medication. Last pap smear:  10-20-15 neg H/o abnormal pap smear: no     reports that she quit smoking about 31 years ago. Her smoking use included cigarettes. She has a 7.50 pack-year smoking history. She has never used smokeless tobacco. She reports that she does not drink alcohol and does not use drugs.  Past Medical History:  Diagnosis Date  . Anxiety   . Bronchiectasis without complication [facility])    pulmologist-  dr Soledad Gerlach [facility])-- last chest CT 11/ 2017;  last exacerbation possible pneumonia 11/ 2017  resolved per pt  . Cataract of both eyes    mature  . Chronic cough    NON-PRODUCTIVE  . DDD (degenerative disc disease), cervical   . Depression   . GERD (gastroesophageal reflux disease)    watches diet very carefully and takes zantac/gaviscon prn  . Grade III internal hemorrhoids   . History of Clostridium difficile colitis    2012-- resolved  . History of concussion    1973 approx--  no residual  . History of iron deficiency anemia   . History of recurrent pneumonia    last bout possible  bacterial 11/ 2017   . IBS (irritable bowel syndrome)   . Mixed stress and urge urinary incontinence   . Moderate persistent asthma    pulmologist-  dr Soledad Gerlach [facility])    . OA (osteoarthritis)    hands, knees  . OAB (overactive bladder)   . OAB (overactive bladder)   . Osteopenia   . Renal cyst, left    simple-- per urologist note (dr Alona Bene, [facility])  . Seasonal allergic rhinitis   . Wears glasses   . Wears hearing aid    bilateral---  left ear is better than right    Past Surgical History:  Procedure Laterality Date  . BREAST BIOPSY Right yrs ago   benign  . CARDIOVASCULAR STRESS TEST  10/12/2003   normal nuclear study w/ no ischemia/  normal LV function and wall motion, ef 75%  . ENDOVENOUS ABLATION SAPHENOUS VEIN W/ LASER Left 12-08-2013   left greater saphenous vein and sclerotherapy left leg by Curt Jews MD  . EVALUATION UNDER ANESTHESIA WITH HEMORRHOIDECTOMY  05/10/2005  . HEMORRHOID SURGERY N/A 01/01/2017   Procedure: SINGLE COLUMN HEMORRHOIDECTOMY;  Surgeon: Leighton Ruff, MD;  Location: [facility];  Service: General;  Laterality: N/A;  . MASS EXCISION  01/2018   left wrist--path pending  .  TRANSTHORACIC ECHOCARDIOGRAM  04-26-2013  [facility])   ef 55-60%/  trivial AR/  mild MR and TR/  RVSF pressure 53mmHg/  no evidence mvp , normal structure and function  . TUBAL LIGATION Bilateral 1992    Current Outpatient Medications  Medication Sig Dispense Refill  . Alum Hydroxide-Mag Carbonate (GAVISCON EXTRA STRENGTH PO) Take 1 tablet by mouth as needed.     . budesonide-formoterol (SYMBICORT) 80-4.5 MCG/ACT inhaler Take 2 puffs first thing in am and then another 2 puffs about 12 hours later. 1 each 11  . cetirizine (ZYRTEC) 10 MG tablet Take 10 mg by mouth as needed for allergies.    . chlorpheniramine (CHLOR-TRIMETON) 4 MG tablet Take 4 mg by mouth every 4 (four) hours as needed for allergies.    . famotidine (PEPCID) 20 MG tablet One after supper 30  tablet 11  . FLUoxetine (PROZAC) 20 MG capsule TAKE (1) CAPSULE DAILY 90 capsule 0  . folic acid (FOLVITE) 878 MCG tablet Take 800 mcg by mouth daily.     . hypromellose (GENTEAL) 0.3 % GEL ophthalmic ointment Place 1 application into both eyes as needed (dry eyes).    . montelukast (SINGULAIR) 10 MG tablet Take 10 mg by mouth at bedtime.    Marland Kitchen MYRBETRIQ 50 MG TB24 tablet TAKE 1 TABLET DAILY 90 tablet 0  . ondansetron (ZOFRAN) 4 MG tablet Take 1 tablet (4 mg total) by mouth 2 (two) times daily as needed for nausea or vomiting. 15 tablet 0  . pantoprazole (PROTONIX) 40 MG tablet Take 1 tablet (40 mg total) by mouth daily. Take 30-60 min before first meal of the day 30 tablet 2  . predniSONE (DELTASONE) 10 MG tablet Take  4 each am x 2 days,   2 each am x 2 days,  1 each am x 2 days and stop 14 tablet 0  . Probiotic Product (PROBIOTIC PO) Take 1 capsule by mouth 2 (two) times daily.     Marland Kitchen Respiratory Therapy Supplies (FLUTTER) DEVI Use as directed 1 each 0  . vitamin E 400 UNIT capsule Take 400 Units by mouth.      No current facility-administered medications for this visit.    Family History  Problem Relation Age of Onset  . Spina bifida Son   . Cancer Paternal Uncle        ? type  . Hypertension Brother   . Heart disease Brother        bypass surgery  . Thyroid disease Daughter   . Thyroid disease Sister   . Breast cancer Neg Hx     Review of Systems  Constitutional: Negative.   HENT: Negative.   Eyes: Negative.   Respiratory: Negative.   Cardiovascular: Negative.   Gastrointestinal: Negative.   Endocrine: Negative.   Genitourinary: Negative.   Musculoskeletal: Negative.   Skin: Negative.   Allergic/Immunologic: Negative.   Neurological: Negative.   Hematological: Negative.   Psychiatric/Behavioral: Negative.     Exam:   BP 122/64   Pulse 68   Resp 16   Ht 5' 1.25" (1.556 m)   Wt 126 lb (57.2 kg)   LMP 12/23/1989   BMI 23.61 kg/m   Height: 5' 1.25" (155.6  cm)  General appearance: alert, cooperative and appears stated age Breasts: normal appearance, no masses or tenderness Abdomen: soft, non-tender; bowel sounds normal; no masses,  no organomegaly Lymph nodes: Cervical, supraclavicular, and axillary nodes normal.  No abnormal inguinal nodes palpated Neurologic: Grossly normal  Pelvic: External  genitalia:  no lesions              Urethra:  normal appearing urethra with no masses, tenderness or lesions              Bartholins and Skenes: normal                 Vagina: normal appearing vagina with normal color and discharge, no lesions              Cervix: no lesions              Pap taken: No. Bimanual Exam:  Uterus:  normal size, contour, position, consistency, mobility, non-tender              Adnexa: normal adnexa and no mass, fullness, tenderness               Rectovaginal: Confirms               Anus:  normal sphincter tone, no lesions  Chaperone, Olene Floss, CMA, was present for exam.  A:  Breast and Pelvic exam PMP, no HRT OAB H/o spinal stenosis Osteoporosis Grief reaction Primary progressive aphasia  P:   Mammogram guidelines reviewed.  She continues to do yearly.   pap smear not indicated RF for Myrbetriq 50mg  daily.  #90/4RF. RF for Prozac 20mg  daily.  #90/4RF Colonoscopy 2019 Declines BMD Reviewed vaccines with pt.  Tdap is due.  Will have done with Dr.Farland Return annually or prn  25 minutes of total time was spent for this patient encounter, including preparation, face-to-face counseling with the patient and coordination of care, and documentation of the encounter.

## 2020-10-09 ENCOUNTER — Encounter: Payer: Self-pay | Admitting: Obstetrics & Gynecology

## 2020-10-09 ENCOUNTER — Ambulatory Visit (INDEPENDENT_AMBULATORY_CARE_PROVIDER_SITE_OTHER): Payer: Medicare PPO | Admitting: Obstetrics & Gynecology

## 2020-10-09 ENCOUNTER — Other Ambulatory Visit: Payer: Self-pay

## 2020-10-09 VITALS — BP 122/64 | HR 68 | Resp 16 | Ht 61.25 in | Wt 126.0 lb

## 2020-10-09 DIAGNOSIS — N3941 Urge incontinence: Secondary | ICD-10-CM | POA: Diagnosis not present

## 2020-10-09 DIAGNOSIS — Z01419 Encounter for gynecological examination (general) (routine) without abnormal findings: Secondary | ICD-10-CM | POA: Diagnosis not present

## 2020-10-09 MED ORDER — MIRABEGRON ER 50 MG PO TB24: 50.0000 mg | ORAL_TABLET | Freq: Every day | ORAL | 4 refills | Status: DC

## 2020-10-09 MED ORDER — FLUOXETINE HCL 20 MG PO CAPS: ORAL_CAPSULE | ORAL | 4 refills | Status: DC

## 2020-10-10 NOTE — Progress Notes (Signed)
Tried calling the pt, NA and there was no VM that picked up.

## 2020-10-11 ENCOUNTER — Encounter: Payer: Self-pay | Admitting: *Deleted

## 2020-10-11 NOTE — Progress Notes (Signed)
Letter mailed

## 2020-11-30 ENCOUNTER — Ambulatory Visit: Payer: Medicare PPO

## 2020-12-05 ENCOUNTER — Ambulatory Visit: Payer: Medicare PPO

## 2020-12-07 ENCOUNTER — Ambulatory Visit: Payer: Medicare PPO

## 2021-01-13 ENCOUNTER — Other Ambulatory Visit: Payer: Self-pay | Admitting: Internal Medicine

## 2021-01-13 DIAGNOSIS — J479 Bronchiectasis, uncomplicated: Secondary | ICD-10-CM

## 2021-01-15 NOTE — Telephone Encounter (Signed)
Refill request from patient's pharmacy. Saw she only had Qty 30 with no refills not sure if this needs to be refilled.   Please advise

## 2021-01-15 NOTE — Telephone Encounter (Signed)
Did not keep f/u appt planned - if doing better give 3 months of refills then ov before it runs out to regroup and see what she needs long term   If not doing better needs ov sooner, whenever we can schedule ( or can get it otc if push comes to shove)

## 2021-01-17 ENCOUNTER — Ambulatory Visit: Payer: Medicare PPO

## 2021-02-15 ENCOUNTER — Other Ambulatory Visit: Payer: Self-pay | Admitting: Internal Medicine

## 2021-02-15 ENCOUNTER — Ambulatory Visit: Payer: Medicare PPO | Admitting: Physical Therapy

## 2021-02-15 DIAGNOSIS — J479 Bronchiectasis, uncomplicated: Secondary | ICD-10-CM

## 2021-02-19 ENCOUNTER — Telehealth: Payer: Self-pay | Admitting: Physical Therapy

## 2021-02-19 NOTE — Telephone Encounter (Signed)
She is still sicj and wants to cx eval on 02/21/21- patient will call back to r/s when she is feeling better.

## 2021-02-21 ENCOUNTER — Ambulatory Visit: Payer: Medicare PPO | Admitting: Physical Therapy

## 2021-03-01 ENCOUNTER — Inpatient Hospital Stay: Admission: RE | Admit: 2021-03-01 | Payer: Medicare PPO | Source: Ambulatory Visit

## 2021-03-19 ENCOUNTER — Encounter: Payer: Self-pay | Admitting: Physical Therapy

## 2021-03-19 ENCOUNTER — Other Ambulatory Visit: Payer: Self-pay

## 2021-03-19 ENCOUNTER — Ambulatory Visit: Payer: Medicare PPO | Attending: Orthopaedic Surgery | Admitting: Physical Therapy

## 2021-03-19 DIAGNOSIS — M5412 Radiculopathy, cervical region: Secondary | ICD-10-CM | POA: Diagnosis present

## 2021-03-19 DIAGNOSIS — M542 Cervicalgia: Secondary | ICD-10-CM | POA: Insufficient documentation

## 2021-03-19 DIAGNOSIS — G8929 Other chronic pain: Secondary | ICD-10-CM | POA: Insufficient documentation

## 2021-03-19 DIAGNOSIS — M6281 Muscle weakness (generalized): Secondary | ICD-10-CM | POA: Diagnosis present

## 2021-03-19 DIAGNOSIS — M545 Low back pain, unspecified: Secondary | ICD-10-CM | POA: Diagnosis present

## 2021-03-19 NOTE — Therapy (Signed)
[facility] [place], [place], [address] Phone: [phone]   Fax:  [phone]  Physical Therapy Evaluation  Patient Details  Name: [name] MRN: [id number] Date of Birth: 01-25-1935 Referring Provider (PT): Arsenio Katz, MD   Encounter Date: 03/19/2021   PT End of Session - 03/19/21 2118    Visit Number 1    Number of Visits 12    Date for PT Re-Evaluation 05/07/21    Authorization Type [employer] Medicare (CQ and KX modifier) Progress note every 10th visit    PT Start Time 1347    PT Stop Time 1430    PT Time Calculation (min) 43 min    Activity Tolerance Patient tolerated treatment well    Behavior During Therapy [facility] for tasks assessed/performed           Past Medical History:  Diagnosis Date  . Anxiety   . Bronchiectasis without complication [facility])    pulmologist-  dr Soledad Gerlach [facility])-- last chest CT 11/ 2017;  last exacerbation possible pneumonia 11/ 2017  resolved per pt  . Cataract of both eyes    mature  . Chronic cough    NON-PRODUCTIVE  . DDD (degenerative disc disease), cervical   . Depression   . GERD (gastroesophageal reflux disease)    watches diet very carefully and takes zantac/gaviscon prn  . Grade III internal hemorrhoids   . History of Clostridium difficile colitis    2012-- resolved  . History of concussion    1973 approx--  no residual  . History of iron deficiency anemia   . History of recurrent pneumonia    last bout possible bacterial 11/ 2017   . IBS (irritable bowel syndrome)   . Mixed stress and urge urinary incontinence   . Moderate persistent asthma    pulmologist-  dr Soledad Gerlach [facility])    . OA (osteoarthritis)    hands, knees  . OAB (overactive bladder)   . OAB (overactive bladder)   . Osteopenia   . Renal cyst, left    simple-- per urologist note (dr Alona Bene, [facility])  . Seasonal allergic rhinitis   . Wears glasses   . Wears hearing aid    bilateral---  left  ear is better than right    Past Surgical History:  Procedure Laterality Date  . BREAST BIOPSY Right yrs ago   benign  . CARDIOVASCULAR STRESS TEST  10/12/2003   normal nuclear study w/ no ischemia/  normal LV function and wall motion, ef 75%  . ENDOVENOUS ABLATION SAPHENOUS VEIN W/ LASER Left 12-08-2013   left greater saphenous vein and sclerotherapy left leg by Curt Jews MD  . EVALUATION UNDER ANESTHESIA WITH HEMORRHOIDECTOMY  05/10/2005  . HEMORRHOID SURGERY N/A 01/01/2017   Procedure: SINGLE COLUMN HEMORRHOIDECTOMY;  Surgeon: Leighton Ruff, MD;  Location: [facility];  Service: General;  Laterality: N/A;  . MASS EXCISION  01/2018   left wrist--path pending  . TRANSTHORACIC ECHOCARDIOGRAM  04-26-2013  [facility])   ef 55-60%/  trivial AR/  mild MR and TR/  RVSF pressure 6mmHg/  no evidence mvp , normal structure and function  . TUBAL LIGATION Bilateral 1992    There were no vitals filed for this visit.    Subjective Assessment - 03/19/21 2106    Subjective COVID-19 screening performed upon arrival. Patient arrives with reports of a chronic history of cervical and low back pain that has progressed throughout the years. Patient reports being very cautious  with movements secondary to pain, particularly with neck motions. Patient reports having some difficulties with sleeping secondary to neck pain. Patinet reports ability to perform ADLs but cautious with movement. Patient has had two falls in the past 6 months with most recent fall occuring on Saturday, 3/26 where she tripped over her feet. Patient was able to come to standing independently. Patient reports increased low back pain with bending forward, and lifting heavier objects. Patient reports pain moderate at worst and mild at best. Patient denies any numbness or tingling down LEs, but some R UE numbness intermittently. Patient's goals are to decrease pain, sleep longer, and improve ability to move and perform ADLs and home  activities.    Pertinent History Aphasia, latex allergy, asthma, osteoporosis, DDD of cervical spine, Anemia, depression    Limitations Standing;Walking;House hold activities    Diagnostic tests MRI no results found in Epic    Patient Stated Goals Improve movement, decrease pain    Currently in Pain? Yes    Pain Score 4     Pain Location Neck    Pain Orientation Posterior    Pain Descriptors / Indicators Sore;Throbbing;Sharp;Shooting    Pain Type Chronic pain    Pain Radiating Towards R UE    Pain Onset More than a month ago    Pain Frequency Constant    Aggravating Factors  bending over, heavier lifting, sleeping out of oder,    Pain Relieving Factors being cautious    Effect of Pain on Daily Activities "it can, I try not to offer it"              [facility] PT Assessment - 03/19/21 0001      Assessment   Medical Diagnosis cervical spondylosis with myelopathy, cervical DDD, Chronic neck pain, lumbar DDD    Referring Provider (PT) Arsenio Katz, MD    Onset Date/Surgical Date --   chronic   Next MD Visit June 2022    Prior Therapy yes      Precautions   Precautions Fall      Restrictions   Weight Bearing Restrictions No      Balance Screen   Has the patient fallen in the past 6 months Yes    How many times? 2    Has the patient had a decrease in activity level because of a fear of falling?  Yes    Is the patient reluctant to leave their home because of a fear of falling?  No      Home Environment   Living Environment Private residence    Living Arrangements Alone      Prior Function   Level of Independence Independent with basic ADLs      Posture/Postural Control   Posture/Postural Control Postural limitations    Postural Limitations Rounded Shoulders;Forward head;Increased thoracic kyphosis      ROM / Strength   AROM / PROM / Strength AROM;Strength      AROM   Overall AROM  Deficits    AROM Assessment Site Cervical    Cervical Flexion 38    Cervical Extension  50    Cervical - Right Side Bend 15    Cervical - Left Side Bend 16    Cervical - Right Rotation 58    Cervical - Left Rotation 58      Strength   Overall Strength Comments hip abd and add in sitting    Strength Assessment Site Hip;Knee    Right/Left Shoulder Right;Left    Right/Left  Hip Right;Left    Right Hip Flexion 4-/5    Right Hip ABduction 3+/5    Right Hip ADduction 3+/5    Left Hip Flexion 4-/5    Left Hip ABduction 3+/5    Left Hip ADduction 3+/5    Right/Left Knee Right;Left    Right Knee Flexion 4/5    Right Knee Extension 4/5    Left Knee Flexion 4/5    Left Knee Extension 4/5      Palpation   Palpation comment tenderness to bilateral cervical paraspinals, UTs, and rhomboid region, minimal tenderness to low back musculature      Transfers   Five time sit to stand comments  14.0 seconds with UE support      Ambulation/Gait   Gait Pattern Step-through pattern;Decreased stride length;Trunk flexed                      Objective measurements completed on examination: See above findings.               PT Education - 03/19/21 2118    Education Details chin tucks, cervical rotation, shoulder rolls, scapular retractions, slow controlled movements    Person(s) Educated Patient    Methods Explanation;Demonstration;Handout    Comprehension Verbalized understanding;Returned demonstration               PT Long Term Goals - 03/19/21 2128      PT LONG TERM GOAL #1   Title Patient will be independent with HEP and its progression    Time 6    Period Weeks    Status New      PT LONG TERM GOAL #2   Title Patient will report ability to perform ADLs and home activities with neck and low back pain less than or equal to 4/10.    Time 6    Period Weeks    Status New      PT LONG TERM GOAL #3   Title Patient will decrease risk of falls and improve functional LE strength as noted by the ability to perform modified 5x sit to stand test with UE  support in 12 seconds or less.    Time 6    Period Weeks    Status New      PT LONG TERM GOAL #4   Title Patient will demonstrate 4+/5 or greater bilateral LE and UE MMT in all planes to improve stability during functional tasks.    Time 6    Period Weeks    Status New                  Plan - 03/19/21 2120    Clinical Impression Statement Patient is an 86 year old female who presents to physical therapy with chronic cervical and lumbar pain that has progressed over the past year. Patient's history taking limited secondary to aphasia. Patient noted with decreased cervical ROM, decreased bilateral LE and UE MMT, and WFL bilateral shoulder AROM. Patinent noted with forward head, rounded shoulders, and increased thoracic kyphosis. Patient tender to palpation to cervical paraspinals, bilateral UTs, rhomboids and minimal to lumbar paraspinals and QLs. Patient and PT discussed HEP and plan of care to which patient reported understanding. Patient would benefit from skilled physical therapy to address deficits and address patient's goals.    Personal Factors and Comorbidities Age;Comorbidity 3+;Time since onset of injury/illness/exacerbation    Comorbidities Aphasia, latex allergy, asthma, osteoporosis, DDD of cervical spine, Anemia, depression  Examination-Activity Limitations Reach Overhead;Locomotion Level;Caring for Others;Bend;Lift;Dressing    Examination-Participation Restrictions Yard Work    Biomedical scientist Low    Rehab Potential Fair    PT Frequency 2x / week    PT Duration 6 weeks    PT Treatment/Interventions ADLs/Self Care Home Management;Cryotherapy;English as a second language teacher;Therapeutic activities;Therapeutic exercise;Balance training;Neuromuscular re-education;Manual techniques;Passive range of motion;Patient/family education;Functional mobility training     PT Next Visit Plan nustep, UBE if tolerable, postural exercises, UE and LE stregthening,  modalities PRN for pain relief.    PT Home Exercise Plan see patient education section    Consulted and Agree with Plan of Care Patient           Patient will benefit from skilled therapeutic intervention in order to improve the following deficits and impairments:  Abnormal gait,Difficulty walking,Decreased range of motion,Decreased activity tolerance,Decreased balance,Decreased strength,Postural dysfunction,Pain,Impaired UE functional use  Visit Diagnosis: Cervicalgia - Plan: PT plan of care cert/re-cert  Radiculopathy, cervical region - Plan: PT plan of care cert/re-cert  Chronic bilateral low back pain, unspecified whether sciatica present - Plan: PT plan of care cert/re-cert  Muscle weakness (generalized) - Plan: PT plan of care cert/re-cert     Problem List Patient Active Problem List   Diagnosis Date Noted  . Upper airway cough syndrome/ VCD 12/31/2019  . Bronchiectasis without complication ([place]) 42/59/5638  . Urge incontinence of urine 10/19/2013  . Pain in limb 08/19/2013  . Varicose veins of lower extremities with other complications 75/64/3329  . DDD (degenerative disc disease), cervical 06/14/2013  . Spinal stenosis of cervical region 06/14/2013  . Depression, major, recurrent, mild ([place]) 05/05/2013  . H/O infectious disease 05/05/2013  . Restless legs syndrome 04/23/2013  . OP (osteoporosis) 04/23/2013  . Asthma, moderate persistent 12/27/2011  . Mixed hyperlipidemia 04/23/2008  . ANEMIA 04/23/2008  . MITRAL VALVE PROLAPSE 04/23/2008  . Seasonal allergic rhinitis 04/23/2008  . PNEUMONIA 04/23/2008    Gabriela Eves, PT, DPT 03/19/2021, 9:39 PM  [facility] [address] [place], [place], [address] Phone: [phone]   Fax:  [phone]  Name: [name] MRN: [id number] Date of Birth: May 29, 1935

## 2021-03-21 ENCOUNTER — Ambulatory Visit: Payer: Medicare PPO | Admitting: Physical Therapy

## 2021-03-22 ENCOUNTER — Ambulatory Visit: Payer: Medicare PPO | Admitting: Physical Therapy

## 2021-03-22 ENCOUNTER — Other Ambulatory Visit: Payer: Self-pay

## 2021-03-22 DIAGNOSIS — M542 Cervicalgia: Secondary | ICD-10-CM | POA: Diagnosis not present

## 2021-03-22 DIAGNOSIS — G8929 Other chronic pain: Secondary | ICD-10-CM

## 2021-03-22 DIAGNOSIS — M545 Low back pain, unspecified: Secondary | ICD-10-CM

## 2021-03-22 DIAGNOSIS — M6281 Muscle weakness (generalized): Secondary | ICD-10-CM

## 2021-03-22 DIAGNOSIS — M5412 Radiculopathy, cervical region: Secondary | ICD-10-CM

## 2021-03-22 NOTE — Therapy (Signed)
[facility] [place], [place], [address] Phone: [phone]   Fax:  [phone]  Physical Therapy Treatment  Patient Details  Name: [name] MRN: [id number] Date of Birth: 1935-01-17 Referring Provider (PT): Arsenio Katz, MD   Encounter Date: 03/22/2021   PT End of Session - 03/22/21 1123    Visit Number 2    Number of Visits 12    Date for PT Re-Evaluation 05/07/21    Authorization Type [employer] (CQ and KX modifier) Progress note every 10th visit    PT Start Time 1115    PT Stop Time 1157    PT Time Calculation (min) 42 min    Activity Tolerance Patient tolerated treatment well    Behavior During Therapy [facility] for tasks assessed/performed           Past Medical History:  Diagnosis Date  . Anxiety   . Bronchiectasis without complication [facility])    pulmologist-  dr Soledad Gerlach [facility])-- last chest CT 11/ 2017;  last exacerbation possible pneumonia 11/ 2017  resolved per pt  . Cataract of both eyes    mature  . Chronic cough    NON-PRODUCTIVE  . DDD (degenerative disc disease), cervical   . Depression   . GERD (gastroesophageal reflux disease)    watches diet very carefully and takes zantac/gaviscon prn  . Grade III internal hemorrhoids   . History of Clostridium difficile colitis    2012-- resolved  . History of concussion    1973 approx--  no residual  . History of iron deficiency anemia   . History of recurrent pneumonia    last bout possible bacterial 11/ 2017   . IBS (irritable bowel syndrome)   . Mixed stress and urge urinary incontinence   . Moderate persistent asthma    pulmologist-  dr Soledad Gerlach [facility])    . OA (osteoarthritis)    hands, knees  . OAB (overactive bladder)   . OAB (overactive bladder)   . Osteopenia   . Renal cyst, left    simple-- per urologist note (dr Alona Bene, [facility])  . Seasonal allergic rhinitis   . Wears glasses   . Wears hearing aid    bilateral---  left  ear is better than right    Past Surgical History:  Procedure Laterality Date  . BREAST BIOPSY Right yrs ago   benign  . CARDIOVASCULAR STRESS TEST  10/12/2003   normal nuclear study w/ no ischemia/  normal LV function and wall motion, ef 75%  . ENDOVENOUS ABLATION SAPHENOUS VEIN W/ LASER Left 12-08-2013   left greater saphenous vein and sclerotherapy left leg by Curt Jews MD  . EVALUATION UNDER ANESTHESIA WITH HEMORRHOIDECTOMY  05/10/2005  . HEMORRHOID SURGERY N/A 01/01/2017   Procedure: SINGLE COLUMN HEMORRHOIDECTOMY;  Surgeon: Leighton Ruff, MD;  Location: [facility];  Service: General;  Laterality: N/A;  . MASS EXCISION  01/2018   left wrist--path pending  . TRANSTHORACIC ECHOCARDIOGRAM  04-26-2013  [facility])   ef 55-60%/  trivial AR/  mild MR and TR/  RVSF pressure 37mmHg/  no evidence mvp , normal structure and function  . TUBAL LIGATION Bilateral 1992    There were no vitals filed for this visit.   Subjective Assessment - 03/22/21 1120    Subjective COVID-19 screening performed upon arrival. Patient reported doing well with some soreness due o last fall.    Pertinent History Aphasia, latex allergy, asthma, osteoporosis, DDD of cervical spine, Anemia,  depression    Limitations Standing;Walking;House hold activities    Diagnostic tests MRI no results found in Epic    Patient Stated Goals Improve movement, decrease pain    Currently in Pain? Yes    Pain Score 4     Pain Location Neck    Pain Orientation Posterior    Pain Descriptors / Indicators Discomfort    Pain Type Chronic pain    Pain Onset More than a month ago    Pain Frequency Constant    Aggravating Factors  bending, lifting and certain movements    Pain Relieving Factors at rest/sitting                             [facility] Adult PT Treatment/Exercise - 03/22/21 0001      Exercises   Exercises Shoulder;Knee/Hip;Lumbar      Lumbar Exercises: Seated   Other Seated Lumbar Exercises  seated core sets with red swiss ball x20      Lumbar Exercises: Supine   Glut Set 20 reps    Bridge 20 reps    Straight Leg Raise 2 seconds   2x10     Knee/Hip Exercises: Aerobic   Nustep L1 x 64min UE/LE activity      Knee/Hip Exercises: Standing   Heel Raises Both;1 set;10 reps    Heel Raises Limitations toe raise x10    Hip Flexion AROM;Both;2 sets;10 reps;Knee bent    Hip Extension Stengthening;Both;AROM;1 set;10 reps;2 sets;Knee straight      Knee/Hip Exercises: Seated   Long Arc Quad Strengthening;Both;2 sets;10 reps;Weights    Long Arc Quad Weight 2 lbs.    Ball Squeeze x30    Clamshell with TheraBand Yellow   x20   Marching Strengthening;Both;2 sets;10 reps    Marching Limitations yellow band    Hamstring Curl Strengthening;Both;20 reps    Hamstring Limitations yelloe band    Sit to Sand with UE support;15 reps      Shoulder Exercises: Supine   Flexion Strengthening;Both;20 reps   with grey ball   Diagonals Strengthening;Both   2x10 with grey ball     Shoulder Exercises: Standing   Other Standing Exercises chin tucks standing with grey ball x15, then with scaption and retractions x 10 reps each                       PT Long Term Goals - 03/22/21 1124      PT LONG TERM GOAL #1   Title Patient will be independent with HEP and its progression    Time 6    Period Weeks    Status On-going      PT LONG TERM GOAL #2   Title Patient will report ability to perform ADLs and home activities with neck and low back pain less than or equal to 4/10.    Time 6    Period Weeks    Status On-going      PT LONG TERM GOAL #3   Title Patient will decrease risk of falls and improve functional LE strength as noted by the ability to perform modified 5x sit to stand test with UE support in 12 seconds or less.    Time 6    Period Weeks    Status On-going      PT LONG TERM GOAL #4   Title Patient will demonstrate 4+/5 or greater bilateral LE and UE MMT in all planes  to improve stability during functional tasks.    Time 6    Period Weeks    Status On-going                 Plan - 03/22/21 1157    Clinical Impression Statement Patient tolerated treatment well today. Patient able to progress with all exercises with good understanding and technique. Patient had no LOB today yet SBA throughtot for safety. Patient reported some ongoing soreness in right UE from last fall and constant neck discomfort. Patient educated on fall prevention today. Patient doing HEP as directed by PT. Current goals ongoing this week.    Personal Factors and Comorbidities Age;Comorbidity 3+;Time since onset of injury/illness/exacerbation    Comorbidities Aphasia, latex allergy, asthma, osteoporosis, DDD of cervical spine, Anemia, depression    Examination-Activity Limitations Reach Overhead;Locomotion Level;Caring for Others;Bend;Lift;Dressing    Examination-Participation Restrictions Yard Work    Stability/Clinical Decision Making Evolving/Moderate complexity    Rehab Potential Fair    PT Frequency 2x / week    PT Duration 6 weeks    PT Treatment/Interventions ADLs/Self Care Home Management;Cryotherapy;English as a second language teacher;Therapeutic activities;Therapeutic exercise;Balance training;Neuromuscular re-education;Manual techniques;Passive range of motion;Patient/family education;Functional mobility training    PT Next Visit Plan nustep, UBE if tolerable, postural exercises, UE and LE stregthening,  modalities PRN for pain relief.    Consulted and Agree with Plan of Care Patient           Patient will benefit from skilled therapeutic intervention in order to improve the following deficits and impairments:  Abnormal gait,Difficulty walking,Decreased range of motion,Decreased activity tolerance,Decreased balance,Decreased strength,Postural dysfunction,Pain,Impaired UE functional use  Visit  Diagnosis: Cervicalgia  Radiculopathy, cervical region  Chronic bilateral low back pain, unspecified whether sciatica present  Muscle weakness (generalized)     Problem List Patient Active Problem List   Diagnosis Date Noted  . Upper airway cough syndrome/ VCD 12/31/2019  . Bronchiectasis without complication ([place]) 95/28/4132  . Urge incontinence of urine 10/19/2013  . Pain in limb 08/19/2013  . Varicose veins of lower extremities with other complications 44/12/270  . DDD (degenerative disc disease), cervical 06/14/2013  . Spinal stenosis of cervical region 06/14/2013  . Depression, major, recurrent, mild ([place]) 05/05/2013  . H/O infectious disease 05/05/2013  . Restless legs syndrome 04/23/2013  . OP (osteoporosis) 04/23/2013  . Asthma, moderate persistent 12/27/2011  . Mixed hyperlipidemia 04/23/2008  . ANEMIA 04/23/2008  . MITRAL VALVE PROLAPSE 04/23/2008  . Seasonal allergic rhinitis 04/23/2008  . PNEUMONIA 04/23/2008    Naia Ruff P, PTA 03/22/2021, 12:00 PM  [facility] [place], [place], [address] Phone: [phone]   Fax:  [phone]  Name: JEROLYN FLENNIKEN MRN: [id number] Date of Birth: Nov 16, 1935

## 2021-03-27 ENCOUNTER — Ambulatory Visit: Payer: Medicare PPO | Admitting: *Deleted

## 2021-03-29 ENCOUNTER — Encounter: Payer: Medicare PPO | Admitting: Physical Therapy

## 2021-04-25 ENCOUNTER — Ambulatory Visit
Admission: RE | Admit: 2021-04-25 | Discharge: 2021-04-25 | Disposition: A | Discharge status: Home / Self Care | Payer: Medicare PPO | Source: Ambulatory Visit | Attending: Obstetrics & Gynecology | Admitting: Obstetrics & Gynecology

## 2021-04-25 ENCOUNTER — Other Ambulatory Visit: Payer: Self-pay

## 2021-04-25 DIAGNOSIS — Z1231 Encounter for screening mammogram for malignant neoplasm of breast: Secondary | ICD-10-CM

## 2021-04-26 ENCOUNTER — Other Ambulatory Visit: Payer: Self-pay | Admitting: Obstetrics & Gynecology

## 2021-04-26 DIAGNOSIS — R928 Other abnormal and inconclusive findings on diagnostic imaging of breast: Secondary | ICD-10-CM

## 2021-05-28 ENCOUNTER — Other Ambulatory Visit: Payer: Medicare PPO

## 2021-06-13 ENCOUNTER — Other Ambulatory Visit: Payer: Medicare PPO

## 2021-07-04 ENCOUNTER — Ambulatory Visit
Admission: RE | Admit: 2021-07-04 | Discharge: 2021-07-04 | Disposition: A | Discharge status: Home / Self Care | Payer: Medicare PPO | Source: Ambulatory Visit | Attending: Obstetrics & Gynecology | Admitting: Obstetrics & Gynecology

## 2021-07-04 ENCOUNTER — Other Ambulatory Visit: Payer: Self-pay

## 2021-07-04 ENCOUNTER — Other Ambulatory Visit: Payer: Self-pay | Admitting: Obstetrics & Gynecology

## 2021-07-04 DIAGNOSIS — N6310 Unspecified lump in the right breast, unspecified quadrant: Secondary | ICD-10-CM

## 2021-07-04 DIAGNOSIS — R928 Other abnormal and inconclusive findings on diagnostic imaging of breast: Secondary | ICD-10-CM

## 2021-07-12 ENCOUNTER — Ambulatory Visit
Admission: RE | Admit: 2021-07-12 | Discharge: 2021-07-12 | Disposition: A | Discharge status: Home / Self Care | Payer: Medicare PPO | Source: Ambulatory Visit | Attending: Obstetrics & Gynecology | Admitting: Obstetrics & Gynecology

## 2021-07-12 ENCOUNTER — Other Ambulatory Visit: Payer: Self-pay

## 2021-07-12 DIAGNOSIS — N6310 Unspecified lump in the right breast, unspecified quadrant: Secondary | ICD-10-CM

## 2021-12-20 ENCOUNTER — Other Ambulatory Visit (HOSPITAL_BASED_OUTPATIENT_CLINIC_OR_DEPARTMENT_OTHER): Payer: Self-pay | Admitting: *Deleted

## 2021-12-20 DIAGNOSIS — N3941 Urge incontinence: Secondary | ICD-10-CM

## 2021-12-20 MED ORDER — FLUOXETINE HCL 20 MG PO CAPS: ORAL_CAPSULE | ORAL | 0 refills | Status: DC

## 2021-12-20 MED ORDER — MIRABEGRON ER 50 MG PO TB24: 50.0000 mg | ORAL_TABLET | Freq: Every day | ORAL | 0 refills | Status: DC

## 2022-01-09 ENCOUNTER — Ambulatory Visit (HOSPITAL_BASED_OUTPATIENT_CLINIC_OR_DEPARTMENT_OTHER): Payer: Medicare PPO | Admitting: Obstetrics & Gynecology

## 2022-01-29 ENCOUNTER — Ambulatory Visit (HOSPITAL_BASED_OUTPATIENT_CLINIC_OR_DEPARTMENT_OTHER): Payer: Medicare PPO | Admitting: Obstetrics & Gynecology

## 2022-02-12 ENCOUNTER — Ambulatory Visit (HOSPITAL_BASED_OUTPATIENT_CLINIC_OR_DEPARTMENT_OTHER): Payer: Medicare PPO | Admitting: Obstetrics & Gynecology

## 2022-03-14 ENCOUNTER — Encounter (HOSPITAL_BASED_OUTPATIENT_CLINIC_OR_DEPARTMENT_OTHER): Payer: Self-pay | Admitting: Obstetrics & Gynecology

## 2022-03-14 ENCOUNTER — Ambulatory Visit (INDEPENDENT_AMBULATORY_CARE_PROVIDER_SITE_OTHER): Payer: Medicare PPO | Admitting: Obstetrics & Gynecology

## 2022-03-14 ENCOUNTER — Other Ambulatory Visit: Payer: Self-pay

## 2022-03-14 VITALS — BP 149/93 | HR 79 | Ht 62.0 in | Wt 120.2 lb

## 2022-03-14 DIAGNOSIS — Z01419 Encounter for gynecological examination (general) (routine) without abnormal findings: Secondary | ICD-10-CM | POA: Diagnosis not present

## 2022-03-14 DIAGNOSIS — Z780 Asymptomatic menopausal state: Secondary | ICD-10-CM

## 2022-03-14 DIAGNOSIS — R4701 Aphasia: Secondary | ICD-10-CM | POA: Diagnosis not present

## 2022-03-14 DIAGNOSIS — F0390 Unspecified dementia without behavioral disturbance: Secondary | ICD-10-CM

## 2022-03-14 DIAGNOSIS — M810 Age-related osteoporosis without current pathological fracture: Secondary | ICD-10-CM

## 2022-03-14 DIAGNOSIS — N3941 Urge incontinence: Secondary | ICD-10-CM | POA: Diagnosis not present

## 2022-03-14 MED ORDER — MIRABEGRON ER 50 MG PO TB24: 50.0000 mg | ORAL_TABLET | Freq: Every day | ORAL | 3 refills | Status: DC

## 2022-03-14 NOTE — Progress Notes (Signed)
87 y.o. G60P2002 Widowed White or Caucasian female here for breast and pelvic exam.  She is accompanied by her daughter today.  She has been diagnosed with dementia and aphasia.  Her daughter has to help with updating history and answering questions. ? ?Denies vaginal bleeding.  Pt is on myrbetriq for OAB.  This does seem to work for her.  Needs RF.  No new urinary complaints. ? ?Patient's last menstrual period was 12/23/1989.          ?Sexually active: No.  ?H/O STD:  no ? ?Health Maintenance: ?PCP:  Dr. Lanell Persons.  Vaccines are up to date:  Has not done bivalent Covid vaccine ?Colonoscopy:  no longer needed ?MMG:  06/2021.  Had biopsy last year. ?Last pap smear:  2016.   ?H/o abnormal pap smear:  no ? ? ? reports that she quit smoking about 33 years ago. Her smoking use included cigarettes. She has a 7.50 pack-year smoking history. She has never used smokeless tobacco. She reports that she does not drink alcohol and does not use drugs. ? ?Past Medical History:  ?Diagnosis Date  ? Anxiety   ? Bronchiectasis without complication ([place])   ? pulmologist-  dr Soledad Gerlach [facility])-- last chest CT 11/ 2017;  last exacerbation possible pneumonia 11/ 2017  resolved per pt  ? Cataract of both eyes   ? mature  ? Chronic cough   ? NON-PRODUCTIVE  ? DDD (degenerative disc disease), cervical   ? Depression   ? GERD (gastroesophageal reflux disease)   ? watches diet very carefully and takes zantac/gaviscon prn  ? Grade III internal hemorrhoids   ? History of Clostridium difficile colitis   ? 2012-- resolved  ? History of concussion   ? 1973 approx--  no residual  ? History of iron deficiency anemia   ? History of recurrent pneumonia   ? last bout possible bacterial 11/ 2017   ? IBS (irritable bowel syndrome)   ? Mixed stress and urge urinary incontinence   ? Moderate persistent asthma   ? pulmologist-  dr Soledad Gerlach [facility])    ? OA (osteoarthritis)   ? hands, knees  ? OAB (overactive bladder)   ? OAB (overactive bladder)   ? Osteopenia    ? Renal cyst, left   ? simple-- per urologist note (dr Alona Bene, [facility])  ? Seasonal allergic rhinitis   ? Wears glasses   ? Wears hearing aid   ? bilateral---  left ear is better than right  ? ? ?Past Surgical History:  ?Procedure Laterality Date  ? BREAST BIOPSY Right yrs ago  ? benign  ? CARDIOVASCULAR STRESS TEST  10/12/2003  ? normal nuclear study w/ no ischemia/  normal LV function and wall motion, ef 75%  ? ENDOVENOUS ABLATION SAPHENOUS VEIN W/ LASER Left 12-08-2013  ? left greater saphenous vein and sclerotherapy left leg by Curt Jews MD  ? EVALUATION UNDER ANESTHESIA WITH HEMORRHOIDECTOMY  05/10/2005  ? HEMORRHOID SURGERY N/A 01/01/2017  ? Procedure: SINGLE COLUMN HEMORRHOIDECTOMY;  Surgeon: Leighton Ruff, MD;  Location: [facility];  Service: General;  Laterality: N/A;  ? MASS EXCISION  01/2018  ? left wrist--path pending  ? TRANSTHORACIC ECHOCARDIOGRAM  04-26-2013  [facility])  ? ef 55-60%/  trivial AR/  mild MR and TR/  RVSF pressure 45mHg/  no evidence mvp , normal structure and function  ? TUBAL LIGATION Bilateral 1992  ? ? ?Current Outpatient Medications  ?Medication Sig Dispense Refill  ? Alum Hydroxide-Mag  Carbonate (GAVISCON EXTRA STRENGTH PO) Take 1 tablet by mouth as needed.     ? budesonide-formoterol (SYMBICORT) 80-4.5 MCG/ACT inhaler Take 2 puffs first thing in am and then another 2 puffs about 12 hours later. 1 each 11  ? cetirizine (ZYRTEC) 10 MG tablet Take 10 mg by mouth as needed for allergies.    ? chlorpheniramine (CHLOR-TRIMETON) 4 MG tablet Take 4 mg by mouth every 4 (four) hours as needed for allergies.    ? famotidine (PEPCID) 20 MG tablet TAKE (1) TABLET DAILY AFTER SUPPER. 30 tablet 0  ? FLUoxetine (PROZAC) 20 MG capsule TAKE (1) CAPSULE DAILY 90 capsule 0  ? folic acid (FOLVITE) 381 MCG tablet Take 800 mcg by mouth daily.     ? hypromellose (GENTEAL) 0.3 % GEL ophthalmic ointment Place 1 application into both eyes as needed (dry eyes).    ? mirabegron ER  (MYRBETRIQ) 50 MG TB24 tablet Take 1 tablet (50 mg total) by mouth daily. 90 tablet 0  ? montelukast (SINGULAIR) 10 MG tablet Take 10 mg by mouth at bedtime.    ? ondansetron (ZOFRAN) 4 MG tablet Take 1 tablet (4 mg total) by mouth 2 (two) times daily as needed for nausea or vomiting. 15 tablet 0  ? pantoprazole (PROTONIX) 40 MG tablet Take 1 tablet (40 mg total) by mouth daily. Take 30-60 min before first meal of the day 30 tablet 2  ? predniSONE (DELTASONE) 10 MG tablet Take  4 each am x 2 days,   2 each am x 2 days,  1 each am x 2 days and stop 14 tablet 0  ? Probiotic Product (PROBIOTIC PO) Take 1 capsule by mouth 2 (two) times daily.     ? Respiratory Therapy Supplies (FLUTTER) DEVI Use as directed 1 each 0  ? vitamin E 400 UNIT capsule Take 400 Units by mouth.     ? ?No current facility-administered medications for this visit.  ? ? ?Family History  ?Problem Relation Age of Onset  ? Spina bifida Son   ? Cancer Paternal Uncle   ?     ? type  ? Hypertension Brother   ? Heart disease Brother   ?     bypass surgery  ? Thyroid disease Daughter   ? Thyroid disease Sister   ? Breast cancer Neg Hx   ? ? ?Review of Systems  ?Constitutional: Negative.   ?Genitourinary: Negative.   ?Psychiatric/Behavioral:  Negative for agitation.   ? ?Exam:   ?BP (!) 149/93 (BP Location: Left Arm, Patient Position: Sitting, Cuff Size: Normal)   Pulse 79   Ht '5\' 2"'$  (1.575 m) Comment: reported  Wt 120 lb 3.2 oz (54.5 kg)   LMP 12/23/1989   BMI 21.98 kg/m?   Height: '5\' 2"'$  (157.5 cm) (reported) ? ?General appearance: alert, cooperative and appears stated age ?Breasts: normal appearance, no masses or tenderness ?Abdomen: soft, non-tender; bowel sounds normal; no masses,  no organomegaly ?Lymph nodes: Cervical, supraclavicular, and axillary nodes normal.  No abnormal inguinal nodes palpated ?Neurologic: Grossly normal ? ?Pelvic: External genitalia:  no lesions ?             Urethra:  normal appearing urethra with no masses, tenderness or  lesions ?             Bartholins and Skenes: normal    ?             Vagina: normal appearing vagina with atrophic changes and no discharge, no lesions ?  Cervix: no lesions ?             Pap taken: No. ?Bimanual Exam:  Uterus:  normal size, contour, position, consistency, mobility, non-tender ?             Adnexa: normal adnexa and no mass, fullness, tenderness ?              Rectovaginal: Confirms ?              Anus:  normal sphincter tone, no lesions ? ?Chaperone, Octaviano Batty, CMA, was present for exam. ? ?Assessment/Plan: ?1. Encntr for gyn exam (general) (routine) w/o abn findings ?- pap smear not indicated.  Last done 2016.  Stopped due to age and low risk hx. ?- MMG 06/2021 ?- colonoscopy no longer recommended ?- lab work done with Dr. Lanell Persons ?- vaccines updated ?- discussed with pt stopping ob/gyn routine exams and follow up as needed with new issues/wymptoms ? ?2. Urge incontinence of urine ?- mirabegron ER (MYRBETRIQ) 50 MG TB24 tablet; Take 1 tablet (50 mg total) by mouth daily.  Dispense: 90 tablet; Refill: 3 ? ?3. Postmenopausal ?- no HRT ? ?4. Age-related osteoporosis without current pathological fracture ? ?5. Aphasia ? ?6. Dementia without behavioral disturbance, psychotic disturbance, mood disturbance, or anxiety, unspecified dementia severity, unspecified dementia type ([place])  ? ? ?

## 2022-03-17 ENCOUNTER — Encounter (HOSPITAL_BASED_OUTPATIENT_CLINIC_OR_DEPARTMENT_OTHER): Payer: Self-pay | Admitting: Obstetrics & Gynecology

## 2022-04-13 ENCOUNTER — Other Ambulatory Visit (HOSPITAL_BASED_OUTPATIENT_CLINIC_OR_DEPARTMENT_OTHER): Payer: Self-pay | Admitting: Obstetrics & Gynecology

## 2023-04-10 ENCOUNTER — Other Ambulatory Visit (HOSPITAL_BASED_OUTPATIENT_CLINIC_OR_DEPARTMENT_OTHER): Payer: Self-pay | Admitting: Obstetrics & Gynecology

## 2023-04-17 ENCOUNTER — Other Ambulatory Visit (HOSPITAL_BASED_OUTPATIENT_CLINIC_OR_DEPARTMENT_OTHER): Payer: Self-pay | Admitting: *Deleted

## 2023-04-17 DIAGNOSIS — N3941 Urge incontinence: Secondary | ICD-10-CM

## 2023-04-17 MED ORDER — MIRABEGRON ER 50 MG PO TB24: 50.0000 mg | ORAL_TABLET | Freq: Every day | ORAL | 0 refills | Status: DC

## 2023-06-22 ENCOUNTER — Emergency Department (HOSPITAL_COMMUNITY): Payer: Medicare PPO

## 2023-06-22 ENCOUNTER — Other Ambulatory Visit: Payer: Self-pay

## 2023-06-22 ENCOUNTER — Emergency Department (HOSPITAL_COMMUNITY)
Admission: EM | Admit: 2023-06-22 | Discharge: 2023-06-22 | Disposition: A | Discharge status: Home / Self Care | Payer: Medicare PPO | Attending: Emergency Medicine | Admitting: Emergency Medicine

## 2023-06-22 ENCOUNTER — Encounter (HOSPITAL_COMMUNITY): Payer: Self-pay

## 2023-06-22 DIAGNOSIS — K5900 Constipation, unspecified: Secondary | ICD-10-CM | POA: Insufficient documentation

## 2023-06-22 DIAGNOSIS — Z91040 Latex allergy status: Secondary | ICD-10-CM | POA: Diagnosis not present

## 2023-06-22 DIAGNOSIS — R1084 Generalized abdominal pain: Secondary | ICD-10-CM | POA: Diagnosis present

## 2023-06-22 DIAGNOSIS — R001 Bradycardia, unspecified: Secondary | ICD-10-CM | POA: Insufficient documentation

## 2023-06-22 DIAGNOSIS — R197 Diarrhea, unspecified: Secondary | ICD-10-CM | POA: Insufficient documentation

## 2023-06-22 LAB — CBC WITH DIFFERENTIAL/PLATELET
Abs Immature Granulocytes: 0.01 K/uL (ref 0.00–0.07)
Basophils Absolute: 0 K/uL (ref 0.0–0.1)
Basophils Relative: 1 %
Eosinophils Absolute: 0.1 K/uL (ref 0.0–0.5)
Eosinophils Relative: 3 %
HCT: 36.6 % (ref 36.0–46.0)
Hemoglobin: 11.6 g/dL — ABNORMAL LOW (ref 12.0–15.0)
Immature Granulocytes: 0 %
Lymphocytes Relative: 18 %
Lymphs Abs: 0.6 K/uL — ABNORMAL LOW (ref 0.7–4.0)
MCH: 29.5 pg (ref 26.0–34.0)
MCHC: 31.7 g/dL (ref 30.0–36.0)
MCV: 93.1 fL (ref 80.0–100.0)
Monocytes Absolute: 0.4 K/uL (ref 0.1–1.0)
Monocytes Relative: 11 %
Neutro Abs: 2.4 K/uL (ref 1.7–7.7)
Neutrophils Relative %: 67 %
Platelets: 199 K/uL (ref 150–400)
RBC: 3.93 MIL/uL (ref 3.87–5.11)
RDW: 14.4 % (ref 11.5–15.5)
WBC: 3.6 K/uL — ABNORMAL LOW (ref 4.0–10.5)
nRBC: 0 % (ref 0.0–0.2)

## 2023-06-22 LAB — URINALYSIS, ROUTINE W REFLEX MICROSCOPIC
Bilirubin Urine: NEGATIVE
Glucose, UA: NEGATIVE mg/dL
Hgb urine dipstick: NEGATIVE
Ketones, ur: NEGATIVE mg/dL
Leukocytes,Ua: NEGATIVE
Nitrite: NEGATIVE
Protein, ur: NEGATIVE mg/dL
Specific Gravity, Urine: 1.017 (ref 1.005–1.030)
pH: 7 (ref 5.0–8.0)

## 2023-06-22 LAB — COMPREHENSIVE METABOLIC PANEL WITH GFR
ALT: 16 U/L (ref 0–44)
AST: 22 U/L (ref 15–41)
Albumin: 3.8 g/dL (ref 3.5–5.0)
Alkaline Phosphatase: 55 U/L (ref 38–126)
Anion gap: 10 (ref 5–15)
BUN: 11 mg/dL (ref 8–23)
CO2: 29 mmol/L (ref 22–32)
Calcium: 9.1 mg/dL (ref 8.9–10.3)
Chloride: 101 mmol/L (ref 98–111)
Creatinine, Ser: 0.88 mg/dL (ref 0.44–1.00)
GFR, Estimated: >60 mL/min
Glucose, Bld: 100 mg/dL — ABNORMAL HIGH (ref 70–99)
Potassium: 3.8 mmol/L (ref 3.5–5.1)
Sodium: 140 mmol/L (ref 135–145)
Total Bilirubin: 0.5 mg/dL (ref 0.3–1.2)
Total Protein: 6.4 g/dL — ABNORMAL LOW (ref 6.5–8.1)

## 2023-06-22 LAB — LIPASE, BLOOD: Lipase: 43 U/L (ref 11–51)

## 2023-06-22 MED ORDER — IOHEXOL 300 MG/ML  SOLN
100.0000 mL | Freq: Once | INTRAMUSCULAR | Status: AC | PRN
  Administered 2023-06-22: 100 mL via INTRAVENOUS

## 2023-06-22 MED ORDER — SODIUM CHLORIDE 0.9 % IV BOLUS
1000.0000 mL | Freq: Once | INTRAVENOUS | Status: AC
  Administered 2023-06-22: 1000 mL via INTRAVENOUS

## 2023-06-22 MED ORDER — ONDANSETRON HCL 4 MG/2ML IJ SOLN
4.0000 mg | Freq: Once | INTRAMUSCULAR | Status: AC
  Administered 2023-06-22: 4 mg via INTRAVENOUS
  Filled 2023-06-22: qty 2

## 2023-06-22 NOTE — ED Triage Notes (Signed)
Bib by sister for abdominal pain/diarrhea. In room ,pt states she is not hurting in her abdomen at thie moment, also her diarrhea has subsided. States it was worse a "few Days ago". Sister states she feels pt is having galbladder issues. VS WNL

## 2023-06-22 NOTE — Discharge Instructions (Signed)
Drink plenty of fluids and follow-up with your family doctor as needed.  You have also been referred to a gastroenterologist

## 2023-06-25 ENCOUNTER — Other Ambulatory Visit (HOSPITAL_BASED_OUTPATIENT_CLINIC_OR_DEPARTMENT_OTHER): Payer: Self-pay | Admitting: Obstetrics & Gynecology

## 2023-06-25 DIAGNOSIS — N3941 Urge incontinence: Secondary | ICD-10-CM

## 2023-06-25 NOTE — ED Provider Notes (Signed)
[place] EMERGENCY DEPARTMENT AT [facility] Provider Note   CSN: [id number] Arrival date & time: 06/22/23  1029     History  Chief Complaint  Patient presents with   Abdominal Pain    [name] is a 88 y.o. female.  Patient presents with diarrhea for a number days.  She has a history of GERD  The history is provided by the patient and medical records. No language interpreter was used.  Abdominal Pain Pain location:  Generalized Pain quality: aching   Pain radiates to:  Does not radiate Pain severity:  Mild Onset quality:  Sudden Timing:  Constant Progression:  Waxing and waning Chronicity:  New Context: not alcohol use   Relieved by:  Nothing Associated symptoms: diarrhea   Associated symptoms: no chest pain, no cough, no fatigue and no hematuria        Home Medications Prior to Admission medications   Medication Sig Start Date End Date Taking? Authorizing Provider  Alum Hydroxide-Mag Carbonate (GAVISCON EXTRA STRENGTH PO) Take 1 tablet by mouth as needed.     [provider]  budesonide-formoterol (SYMBICORT) 80-4.5 MCG/ACT inhaler Take 2 puffs first thing in am and then another 2 puffs about 12 hours later. 10/03/20   Nyoka Cowden, MD  cetirizine (ZYRTEC) 10 MG tablet Take 10 mg by mouth as needed for allergies.    [provider]  chlorpheniramine (CHLOR-TRIMETON) 4 MG tablet Take 4 mg by mouth every 4 (four) hours as needed for allergies.    [provider]  famotidine (PEPCID) 20 MG tablet TAKE (1) TABLET DAILY AFTER SUPPER. 01/15/21   Nyoka Cowden, MD  FLUoxetine (PROZAC) 20 MG capsule TAKE 1 CAPSULE BY MOUTH EVERY DAY 04/10/23   Jerene Bears, MD  folic acid (FOLVITE) 800 MCG tablet Take 800 mcg by mouth daily.     [provider]  hypromellose (GENTEAL) 0.3 % GEL ophthalmic ointment Place 1 application into both eyes as needed (dry eyes).    [provider]  mirabegron ER (MYRBETRIQ) 50 MG  TB24 tablet Take 1 tablet (50 mg total) by mouth daily. 04/17/23   Jerene Bears, MD  montelukast (SINGULAIR) 10 MG tablet Take 10 mg by mouth at bedtime.    [provider]  ondansetron (ZOFRAN) 4 MG tablet Take 1 tablet (4 mg total) by mouth 2 (two) times daily as needed for nausea or vomiting. 10/23/18   Johna Sheriff, MD  pantoprazole (PROTONIX) 40 MG tablet Take 1 tablet (40 mg total) by mouth daily. Take 30-60 min before first meal of the day 12/30/19   Nyoka Cowden, MD  predniSONE (DELTASONE) 10 MG tablet Take  4 each am x 2 days,   2 each am x 2 days,  1 each am x 2 days and stop 10/03/20   Nyoka Cowden, MD  Probiotic Product (PROBIOTIC PO) Take 1 capsule by mouth 2 (two) times daily.     [provider]  Respiratory Therapy Supplies (FLUTTER) DEVI Use as directed 12/30/19   Nyoka Cowden, MD  vitamin E 400 UNIT capsule Take 400 Units by mouth.     [provider]      Allergies    Chocolate flavor, Lactose, Latex, Penicillins, Statins, and Other    Review of Systems   Review of Systems  Constitutional:  Negative for appetite change and fatigue.  HENT:  Negative for congestion, ear discharge and sinus pressure.   Eyes:  Negative for discharge.  Respiratory:  Negative for cough.   Cardiovascular:  Negative for chest pain.  Gastrointestinal:  Positive for abdominal pain and diarrhea.  Genitourinary:  Negative for frequency and hematuria.  Musculoskeletal:  Negative for back pain.  Skin:  Negative for rash.  Neurological:  Negative for seizures and headaches.  Psychiatric/Behavioral:  Negative for hallucinations.     Physical Exam Updated Vital Signs BP (!) 160/61   Pulse (!) 50   Temp 97.9 F (36.6 C) (Oral)   Resp 20   Ht 5\' 2"  (1.575 m)   Wt 50.8 kg   LMP 12/23/1989   SpO2 95%   BMI 20.49 kg/m  Physical Exam Vitals and nursing note reviewed.  Constitutional:      Appearance: She is well-developed.  HENT:     Head: Normocephalic.      Nose: Nose normal.  Eyes:     General: No scleral icterus.    Conjunctiva/sclera: Conjunctivae normal.  Neck:     Thyroid: No thyromegaly.  Cardiovascular:     Rate and Rhythm: Normal rate and regular rhythm.     Heart sounds: No murmur heard.    No friction rub. No gallop.  Pulmonary:     Breath sounds: No stridor. No wheezing or rales.  Chest:     Chest wall: No tenderness.  Abdominal:     General: There is no distension.     Tenderness: There is no abdominal tenderness. There is no rebound.  Musculoskeletal:        General: Normal range of motion.     Cervical back: Neck supple.  Lymphadenopathy:     Cervical: No cervical adenopathy.  Skin:    Findings: No erythema or rash.  Neurological:     Mental Status: She is alert and oriented to person, place, and time.     Motor: No abnormal muscle tone.     Coordination: Coordination normal.  Psychiatric:        Behavior: Behavior normal.     ED Results / Procedures / Treatments   Labs (all labs ordered are listed, but only abnormal results are displayed) Labs Reviewed  CBC WITH DIFFERENTIAL/PLATELET - Abnormal; Notable for the following components:      Result Value   WBC 3.6 (*)    Hemoglobin 11.6 (*)    Lymphs Abs 0.6 (*)    All other components within normal limits  COMPREHENSIVE METABOLIC PANEL - Abnormal; Notable for the following components:   Glucose, Bld 100 (*)    Total Protein 6.4 (*)    All other components within normal limits  URINALYSIS, ROUTINE W REFLEX MICROSCOPIC - Abnormal; Notable for the following components:   Color, Urine COLORLESS (*)    All other components within normal limits  LIPASE, BLOOD    EKG EKG Interpretation Date/Time:  Sunday June 22 2023 10:48:54 EDT Ventricular Rate:  49 PR Interval:  146 QRS Duration:  96 QT Interval:  434 QTC Calculation: 392 R Axis:   63  Text Interpretation: Sinus bradycardia Borderline T abnormalities, inferior leads Confirmed by Glyn Ade [phone]) on 06/23/2023 6:07:54 PM  Radiology No results found.  Procedures Procedures    Medications Ordered in ED Medications  sodium chloride 0.9 % bolus 1,000 mL (0 mLs Intravenous Stopped 06/22/23 1321)  ondansetron (ZOFRAN) injection 4 mg (4 mg Intravenous Given 06/22/23 1201)  iohexol (OMNIPAQUE) 300 MG/ML solution 100 mL (100 mLs Intravenous Contrast Given 06/22/23 1142)    ED Course/ Medical Decision  Making/ A&P                             Medical Decision Making Amount and/or Complexity of Data Reviewed Labs: ordered. Radiology: ordered.  Risk Prescription drug management.  This patient presents to the ED for concern of diarrhea, this involves an extensive number of treatment options, and is a complaint that carries with it a high risk of complications and morbidity.  The differential diagnosis includes infectious diarrhea, inflammatory disease   Co morbidities that complicate the patient evaluation  GERD   Additional history obtained:  Additional history obtained from friend External records from outside source obtained and reviewed including hospital records   Lab Tests:  I Ordered, and personally interpreted labs.  The pertinent results include: Hemoglobin 11.6, chemistries normal   Imaging Studies ordered:  I ordered imaging studies including CT abdomen I independently visualized and interpreted imaging which showed moderate stool I agree with the radiologist interpretation   Cardiac Monitoring: / EKG:  The patient was maintained on a cardiac monitor.  I personally viewed and interpreted the cardiac monitored which showed an underlying rhythm of: Normal sinus rhythm   Consultations Obtained:  No consultant  Problem List / ED Course / Critical interventions / Medication management  Diarrhea I ordered medication including normal saline for dehydration Reevaluation of the patient after these medicines showed that the patient improved I  have reviewed the patients home medicines and have made adjustments as needed   Social Determinants of Health:  None   Test / Admission - Considered:  None  Patient with diarrhea and GERD.  She is referred back to her family doctor and her GI doctor        Final Clinical Impression(s) / ED Diagnoses Final diagnoses:  Constipation, unspecified constipation type    Rx / DC Orders ED Discharge Orders     None         Bethann Berkshire, MD 06/25/23 1150

## 2023-07-08 ENCOUNTER — Other Ambulatory Visit (HOSPITAL_BASED_OUTPATIENT_CLINIC_OR_DEPARTMENT_OTHER): Payer: Self-pay | Admitting: Obstetrics & Gynecology

## 2023-07-27 ENCOUNTER — Encounter (HOSPITAL_COMMUNITY): Payer: Self-pay

## 2023-07-27 ENCOUNTER — Other Ambulatory Visit: Payer: Self-pay

## 2023-07-27 ENCOUNTER — Emergency Department (HOSPITAL_COMMUNITY)
Admission: EM | Admit: 2023-07-27 | Discharge: 2023-07-28 | Disposition: A | Discharge status: Home / Self Care | Payer: Medicare PPO | Attending: Emergency Medicine | Admitting: Emergency Medicine

## 2023-07-27 ENCOUNTER — Emergency Department (HOSPITAL_COMMUNITY): Payer: Medicare PPO

## 2023-07-27 DIAGNOSIS — J45909 Unspecified asthma, uncomplicated: Secondary | ICD-10-CM | POA: Insufficient documentation

## 2023-07-27 DIAGNOSIS — J40 Bronchitis, not specified as acute or chronic: Secondary | ICD-10-CM

## 2023-07-27 DIAGNOSIS — R0789 Other chest pain: Secondary | ICD-10-CM | POA: Diagnosis not present

## 2023-07-27 DIAGNOSIS — Z91040 Latex allergy status: Secondary | ICD-10-CM | POA: Insufficient documentation

## 2023-07-27 LAB — BASIC METABOLIC PANEL
Anion gap: 8 (ref 5–15)
BUN: 13 mg/dL (ref 8–23)
CO2: 28 mmol/L (ref 22–32)
Calcium: 8.5 mg/dL — ABNORMAL LOW (ref 8.9–10.3)
Chloride: 97 mmol/L — ABNORMAL LOW (ref 98–111)
Creatinine, Ser: 0.75 mg/dL (ref 0.44–1.00)
GFR, Estimated: >60 mL/min (ref >60)
Glucose, Bld: 99 mg/dL (ref 70–99)
Potassium: 3.9 mmol/L (ref 3.5–5.1)
Sodium: 133 mmol/L — ABNORMAL LOW (ref 135–145)

## 2023-07-27 LAB — CBC WITH DIFFERENTIAL/PLATELET
Abs Immature Granulocytes: 0.01 10*3/uL (ref 0.00–0.07)
Basophils Absolute: 0 10*3/uL (ref 0.0–0.1)
Basophils Relative: 1 %
Eosinophils Absolute: 0.1 10*3/uL (ref 0.0–0.5)
Eosinophils Relative: 2 %
HCT: 36.4 % (ref 36.0–46.0)
Hemoglobin: 12.1 g/dL (ref 12.0–15.0)
Immature Granulocytes: 0 %
Lymphocytes Relative: 18 %
Lymphs Abs: 1.1 10*3/uL (ref 0.7–4.0)
MCH: 30.3 pg (ref 26.0–34.0)
MCHC: 33.2 g/dL (ref 30.0–36.0)
MCV: 91.2 fL (ref 80.0–100.0)
Monocytes Absolute: 0.5 10*3/uL (ref 0.1–1.0)
Monocytes Relative: 9 %
Neutro Abs: 4.3 10*3/uL (ref 1.7–7.7)
Neutrophils Relative %: 70 %
Platelets: 187 10*3/uL (ref 150–400)
RBC: 3.99 MIL/uL (ref 3.87–5.11)
RDW: 14.6 % (ref 11.5–15.5)
WBC: 6 10*3/uL (ref 4.0–10.5)
nRBC: 0 % (ref 0.0–0.2)

## 2023-07-27 LAB — PROTIME-INR
INR: 0.9 (ref 0.8–1.2)
Prothrombin Time: 12.3 seconds (ref 11.4–15.2)

## 2023-07-27 LAB — TROPONIN I (HIGH SENSITIVITY): Troponin I (High Sensitivity): 4 ng/L (ref <18)

## 2023-07-27 NOTE — ED Triage Notes (Signed)
Pt arrived from home via [facility] who were called out due to Pt reporting chest tightness. Per [facility], Pt is poor historian due to dementia. Per [facility], Pt was Sinus Huston Foley on their monitor. Pt reports she took her nighttime medication, then, chest tightness and headache shortly followed.

## 2023-07-28 DIAGNOSIS — R0789 Other chest pain: Secondary | ICD-10-CM | POA: Diagnosis not present

## 2023-07-28 LAB — TROPONIN I (HIGH SENSITIVITY): Troponin I (High Sensitivity): 4 ng/L (ref <18)

## 2023-07-28 MED ORDER — AZITHROMYCIN 250 MG PO TABS
500.0000 mg | ORAL_TABLET | Freq: Once | ORAL | Status: AC
  Administered 2023-07-28: 500 mg via ORAL
  Filled 2023-07-28: qty 2

## 2023-07-28 MED ORDER — AZITHROMYCIN 250 MG PO TABS: 250.0000 mg | ORAL_TABLET | Freq: Every day | ORAL | 0 refills | Status: AC

## 2023-07-28 NOTE — ED Provider Notes (Signed)
[place] EMERGENCY DEPARTMENT AT [facility] Provider Note   CSN: [id number] Arrival date & time: 07/27/23  2231     History  Chief Complaint  Patient presents with   Chest Pain    [name] is a 88 y.o. female.  Patient is an 88 year old female with past medical history of hyperlipidemia, asthma, bronchiectasis, seasonal allergies.  Patient presenting today for evaluation of chest discomfort.  She reports taking a melatonin this evening before bed, then began to feel warm and flushed.  She describes some tightness to her chest, but no shortness of breath, nausea, diaphoresis, or radiation to the arm or jaw.  Symptoms resolved shortly upon arrival to the ER.  Patient has no prior cardiac history.  The history is provided by the patient.       Home Medications Prior to Admission medications   Medication Sig Start Date End Date Taking? Authorizing Provider  Alum Hydroxide-Mag Carbonate (GAVISCON EXTRA STRENGTH PO) Take 1 tablet by mouth as needed.     [provider]  budesonide-formoterol (SYMBICORT) 80-4.5 MCG/ACT inhaler Take 2 puffs first thing in am and then another 2 puffs about 12 hours later. 10/03/20   Nyoka Cowden, MD  cetirizine (ZYRTEC) 10 MG tablet Take 10 mg by mouth as needed for allergies.    [provider]  chlorpheniramine (CHLOR-TRIMETON) 4 MG tablet Take 4 mg by mouth every 4 (four) hours as needed for allergies.    [provider]  famotidine (PEPCID) 20 MG tablet TAKE (1) TABLET DAILY AFTER SUPPER. 01/15/21   Nyoka Cowden, MD  FLUoxetine (PROZAC) 20 MG capsule TAKE 1 CAPSULE BY MOUTH EVERY DAY 07/09/23   Jerene Bears, MD  folic acid (FOLVITE) 800 MCG tablet Take 800 mcg by mouth daily.     [provider]  hypromellose (GENTEAL) 0.3 % GEL ophthalmic ointment Place 1 application into both eyes as needed (dry eyes).    [provider]  montelukast (SINGULAIR) 10 MG tablet Take 10 mg by mouth at  bedtime.    [provider]  MYRBETRIQ 50 MG TB24 tablet TAKE 1 TABLET BY MOUTH EVERY DAY 07/09/23   Jerene Bears, MD  ondansetron (ZOFRAN) 4 MG tablet Take 1 tablet (4 mg total) by mouth 2 (two) times daily as needed for nausea or vomiting. 10/23/18   Johna Sheriff, MD  pantoprazole (PROTONIX) 40 MG tablet Take 1 tablet (40 mg total) by mouth daily. Take 30-60 min before first meal of the day 12/30/19   Nyoka Cowden, MD  predniSONE (DELTASONE) 10 MG tablet Take  4 each am x 2 days,   2 each am x 2 days,  1 each am x 2 days and stop 10/03/20   Nyoka Cowden, MD  Probiotic Product (PROBIOTIC PO) Take 1 capsule by mouth 2 (two) times daily.     [provider]  Respiratory Therapy Supplies (FLUTTER) DEVI Use as directed 12/30/19   Nyoka Cowden, MD  vitamin E 400 UNIT capsule Take 400 Units by mouth.     [provider]      Allergies    Chocolate flavor, Lactose, Latex, Penicillins, Statins, and Other    Review of Systems   Review of Systems  All other systems reviewed and are negative.   Physical Exam Updated Vital Signs BP (!) 145/35   Pulse (!) 56   Temp 98.1 F (36.7 C)   Resp (!) 26   Ht  5\' 2"  (1.575 m)   Wt 50 kg   LMP 12/23/1989   SpO2 95%   BMI 20.16 kg/m  Physical Exam Vitals and nursing note reviewed.  Constitutional:      General: She is not in acute distress.    Appearance: She is well-developed. She is not diaphoretic.  HENT:     Head: Normocephalic and atraumatic.  Cardiovascular:     Rate and Rhythm: Normal rate and regular rhythm.     Heart sounds: No murmur heard.    No friction rub. No gallop.  Pulmonary:     Effort: Pulmonary effort is normal. No respiratory distress.     Breath sounds: Normal breath sounds. No wheezing.  Abdominal:     General: Bowel sounds are normal. There is no distension.     Palpations: Abdomen is soft.     Tenderness: There is no abdominal tenderness.  Musculoskeletal:        General: Normal  range of motion.     Cervical back: Normal range of motion and neck supple.     Right lower leg: No tenderness. No edema.     Left lower leg: No tenderness. No edema.  Skin:    General: Skin is warm and dry.  Neurological:     General: No focal deficit present.     Mental Status: She is alert and oriented to person, place, and time.     ED Results / Procedures / Treatments   Labs (all labs ordered are listed, but only abnormal results are displayed) Labs Reviewed  BASIC METABOLIC PANEL - Abnormal; Notable for the following components:      Result Value   Sodium 133 (*)    Chloride 97 (*)    Calcium 8.5 (*)    All other components within normal limits  PROTIME-INR  CBC WITH DIFFERENTIAL/PLATELET  TROPONIN I (HIGH SENSITIVITY)  TROPONIN I (HIGH SENSITIVITY)    EKG EKG Interpretation Date/Time:  Sunday July 27 2023 22:39:45 EDT Ventricular Rate:  51 PR Interval:  153 QRS Duration:  102 QT Interval:  446 QTC Calculation: 411 R Axis:   62  Text Interpretation: Sinus bradycardia Atrial premature complex No significant change since 06/22/2023 Confirmed by Geoffery Lyons ([id number]) on 07/28/2023 1:22:16 AM  Radiology DG Chest 2 View  Result Date: 07/27/2023 CLINICAL DATA:  Chest pain. EXAM: CHEST - 2 VIEW COMPARISON:  06/22/2023 FINDINGS: The apices are excluded from the AP field of view. Lordotic positioning. The heart is stable in size. The cardiomediastinal contours are normal. Pulmonary vasculature is normal. No consolidation, pleural effusion, or pneumothorax. No acute osseous abnormalities are seen. IMPRESSION: No acute chest findings. Electronically Signed   By: Narda Rutherford M.D.   On: 07/27/2023 23:30    Procedures Procedures    Medications Ordered in ED Medications  azithromycin (ZITHROMAX) tablet 500 mg (has no administration in time range)    ED Course/ Medical Decision Making/ A&P  Patient presenting with chest discomfort as described in the HPI.  She arrives  here with stable vital signs and is afebrile.  Physical examination basically unremarkable.  Workup initiated including CBC, metabolic panel, and troponin x 2, all of which are unremarkable.  Both troponins are flat.  Chest x-ray shows no acute process.  Symptoms resolved shortly after arrival to the ER.  Patient has had 2 negative troponins along with very atypical symptoms.  I feel as though she can safely be discharged.  She is describing coughing up phlegm that has  worsened recently.  I will prescribe Zithromax and have her follow-up with primary doctor if not improving.  Final Clinical Impression(s) / ED Diagnoses Final diagnoses:  None    Rx / DC Orders ED Discharge Orders     None         Geoffery Lyons, MD 07/28/23 [phone]

## 2023-07-28 NOTE — Discharge Instructions (Signed)
Begin taking Zithromax as prescribed.  Return to the emergency department if symptoms significantly worsen or change.
